# Patient Record
Sex: Male | Born: 1940 | Race: White | Hispanic: No | Marital: Married | Smoking: Former smoker
Health system: Southern US, Community
[De-identification: ages and names within clinical notes are randomized; demographics above are authoritative.]

## PROBLEM LIST (undated history)

## (undated) DIAGNOSIS — Z952 Presence of prosthetic heart valve: Secondary | ICD-10-CM

## (undated) DIAGNOSIS — I10 Essential (primary) hypertension: Secondary | ICD-10-CM

## (undated) DIAGNOSIS — I350 Nonrheumatic aortic (valve) stenosis: Secondary | ICD-10-CM

## (undated) DIAGNOSIS — I2510 Atherosclerotic heart disease of native coronary artery without angina pectoris: Secondary | ICD-10-CM

## (undated) DIAGNOSIS — E782 Mixed hyperlipidemia: Secondary | ICD-10-CM

## (undated) DIAGNOSIS — I739 Peripheral vascular disease, unspecified: Secondary | ICD-10-CM

## (undated) DIAGNOSIS — I779 Disorder of arteries and arterioles, unspecified: Secondary | ICD-10-CM

## (undated) DIAGNOSIS — K8689 Other specified diseases of pancreas: Secondary | ICD-10-CM

## (undated) DIAGNOSIS — I447 Left bundle-branch block, unspecified: Secondary | ICD-10-CM

## (undated) DIAGNOSIS — Z951 Presence of aortocoronary bypass graft: Secondary | ICD-10-CM

## (undated) DIAGNOSIS — M1990 Unspecified osteoarthritis, unspecified site: Secondary | ICD-10-CM

## (undated) DIAGNOSIS — I4891 Unspecified atrial fibrillation: Secondary | ICD-10-CM

## (undated) HISTORY — DX: Left bundle-branch block, unspecified: I44.7

## (undated) HISTORY — PX: OTHER SURGICAL HISTORY: SHX169

## (undated) HISTORY — DX: Morbid (severe) obesity due to excess calories: E66.01

## (undated) HISTORY — DX: Nonrheumatic aortic (valve) stenosis: I35.0

## (undated) HISTORY — DX: Other specified diseases of pancreas: K86.89

## (undated) HISTORY — DX: Peripheral vascular disease, unspecified: I73.9

## (undated) HISTORY — DX: Atherosclerotic heart disease of native coronary artery without angina pectoris: I25.10

## (undated) HISTORY — DX: Unspecified atrial fibrillation: I48.91

## (undated) HISTORY — DX: Presence of aortocoronary bypass graft: Z95.1

## (undated) HISTORY — DX: Disorder of arteries and arterioles, unspecified: I77.9

## (undated) HISTORY — DX: Mixed hyperlipidemia: E78.2

## (undated) HISTORY — DX: Essential (primary) hypertension: I10

---

## 1986-11-14 HISTORY — PX: VARICOSE VEIN SURGERY: SHX832

## 2002-01-29 ENCOUNTER — Encounter: Payer: Self-pay | Admitting: Cardiology

## 2002-01-29 ENCOUNTER — Inpatient Hospital Stay (HOSPITAL_COMMUNITY): Admission: EM | Admit: 2002-01-29 | Discharge: 2002-01-31 | Payer: Self-pay | Admitting: Cardiology

## 2002-04-18 ENCOUNTER — Inpatient Hospital Stay (HOSPITAL_COMMUNITY): Admission: EM | Admit: 2002-04-18 | Discharge: 2002-04-24 | Payer: Self-pay | Admitting: Cardiology

## 2002-04-21 ENCOUNTER — Encounter: Payer: Self-pay | Admitting: Cardiology

## 2003-02-14 ENCOUNTER — Inpatient Hospital Stay (HOSPITAL_COMMUNITY): Admission: AD | Admit: 2003-02-14 | Discharge: 2003-02-24 | Payer: Self-pay | Admitting: Cardiology

## 2003-02-15 ENCOUNTER — Encounter: Payer: Self-pay | Admitting: Cardiology

## 2003-02-16 ENCOUNTER — Encounter: Payer: Self-pay | Admitting: Cardiology

## 2003-02-17 ENCOUNTER — Encounter: Payer: Self-pay | Admitting: Cardiothoracic Surgery

## 2003-02-17 DIAGNOSIS — Z951 Presence of aortocoronary bypass graft: Secondary | ICD-10-CM

## 2003-02-17 HISTORY — DX: Presence of aortocoronary bypass graft: Z95.1

## 2003-02-17 HISTORY — PX: CORONARY ARTERY BYPASS GRAFT: SHX141

## 2003-02-18 ENCOUNTER — Encounter: Payer: Self-pay | Admitting: Cardiothoracic Surgery

## 2003-02-19 ENCOUNTER — Encounter: Payer: Self-pay | Admitting: Cardiothoracic Surgery

## 2003-02-20 ENCOUNTER — Encounter: Payer: Self-pay | Admitting: Cardiothoracic Surgery

## 2003-03-20 ENCOUNTER — Encounter: Admission: RE | Admit: 2003-03-20 | Discharge: 2003-03-20 | Payer: Self-pay | Admitting: Cardiothoracic Surgery

## 2003-03-20 ENCOUNTER — Encounter: Payer: Self-pay | Admitting: Cardiothoracic Surgery

## 2004-11-19 ENCOUNTER — Ambulatory Visit: Payer: Self-pay | Admitting: Cardiology

## 2005-05-09 ENCOUNTER — Ambulatory Visit: Payer: Self-pay | Admitting: Cardiology

## 2006-04-17 ENCOUNTER — Ambulatory Visit: Payer: Self-pay | Admitting: Cardiology

## 2006-10-18 ENCOUNTER — Ambulatory Visit: Payer: Self-pay | Admitting: Cardiology

## 2006-10-27 ENCOUNTER — Ambulatory Visit: Payer: Self-pay | Admitting: Cardiology

## 2006-11-09 ENCOUNTER — Ambulatory Visit: Payer: Self-pay | Admitting: Cardiology

## 2007-05-21 ENCOUNTER — Ambulatory Visit: Payer: Self-pay | Admitting: Cardiology

## 2008-01-22 ENCOUNTER — Ambulatory Visit: Payer: Self-pay | Admitting: Cardiology

## 2008-02-04 ENCOUNTER — Encounter: Payer: Self-pay | Admitting: Cardiology

## 2008-02-04 ENCOUNTER — Ambulatory Visit: Payer: Self-pay | Admitting: Cardiology

## 2008-02-05 ENCOUNTER — Inpatient Hospital Stay (HOSPITAL_COMMUNITY): Admission: AD | Admit: 2008-02-05 | Discharge: 2008-02-06 | Payer: Self-pay | Admitting: Internal Medicine

## 2008-02-05 ENCOUNTER — Encounter: Payer: Self-pay | Admitting: Cardiology

## 2008-02-05 ENCOUNTER — Ambulatory Visit: Payer: Self-pay | Admitting: Internal Medicine

## 2008-02-14 ENCOUNTER — Ambulatory Visit: Payer: Self-pay | Admitting: Cardiology

## 2008-02-14 ENCOUNTER — Encounter: Payer: Self-pay | Admitting: Physician Assistant

## 2008-02-28 ENCOUNTER — Encounter: Payer: Self-pay | Admitting: Cardiology

## 2008-02-28 ENCOUNTER — Ambulatory Visit: Payer: Self-pay | Admitting: Cardiology

## 2008-09-15 ENCOUNTER — Ambulatory Visit: Payer: Self-pay | Admitting: Cardiology

## 2009-04-10 ENCOUNTER — Encounter: Payer: Self-pay | Admitting: Physician Assistant

## 2009-04-10 ENCOUNTER — Ambulatory Visit: Payer: Self-pay | Admitting: Cardiology

## 2009-08-06 DIAGNOSIS — I2510 Atherosclerotic heart disease of native coronary artery without angina pectoris: Secondary | ICD-10-CM | POA: Insufficient documentation

## 2009-08-06 DIAGNOSIS — E785 Hyperlipidemia, unspecified: Secondary | ICD-10-CM | POA: Insufficient documentation

## 2009-08-06 DIAGNOSIS — I4811 Longstanding persistent atrial fibrillation: Secondary | ICD-10-CM | POA: Insufficient documentation

## 2009-09-10 ENCOUNTER — Encounter: Payer: Self-pay | Admitting: Cardiology

## 2009-10-19 ENCOUNTER — Ambulatory Visit: Payer: Self-pay | Admitting: Cardiology

## 2009-10-19 DIAGNOSIS — I739 Peripheral vascular disease, unspecified: Secondary | ICD-10-CM | POA: Insufficient documentation

## 2009-10-19 DIAGNOSIS — I6529 Occlusion and stenosis of unspecified carotid artery: Secondary | ICD-10-CM

## 2009-10-19 DIAGNOSIS — I10 Essential (primary) hypertension: Secondary | ICD-10-CM | POA: Insufficient documentation

## 2009-10-21 ENCOUNTER — Encounter: Payer: Self-pay | Admitting: Cardiology

## 2010-03-17 ENCOUNTER — Telehealth (INDEPENDENT_AMBULATORY_CARE_PROVIDER_SITE_OTHER): Payer: Self-pay | Admitting: *Deleted

## 2010-04-16 ENCOUNTER — Ambulatory Visit: Payer: Self-pay | Admitting: Cardiology

## 2010-06-17 ENCOUNTER — Encounter (INDEPENDENT_AMBULATORY_CARE_PROVIDER_SITE_OTHER): Payer: Self-pay | Admitting: *Deleted

## 2010-06-18 ENCOUNTER — Encounter: Payer: Self-pay | Admitting: Cardiology

## 2010-09-21 ENCOUNTER — Telehealth (INDEPENDENT_AMBULATORY_CARE_PROVIDER_SITE_OTHER): Payer: Self-pay | Admitting: *Deleted

## 2010-10-09 ENCOUNTER — Encounter: Payer: Self-pay | Admitting: Cardiology

## 2010-10-20 ENCOUNTER — Encounter: Payer: Self-pay | Admitting: Cardiology

## 2010-10-25 ENCOUNTER — Encounter: Payer: Self-pay | Admitting: Cardiology

## 2010-10-25 ENCOUNTER — Ambulatory Visit: Payer: Self-pay | Admitting: Cardiology

## 2010-10-25 ENCOUNTER — Encounter (INDEPENDENT_AMBULATORY_CARE_PROVIDER_SITE_OTHER): Payer: Self-pay | Admitting: *Deleted

## 2010-10-25 DIAGNOSIS — I429 Cardiomyopathy, unspecified: Secondary | ICD-10-CM | POA: Insufficient documentation

## 2010-11-22 ENCOUNTER — Encounter: Payer: Self-pay | Admitting: Cardiology

## 2010-11-22 ENCOUNTER — Ambulatory Visit
Admission: RE | Admit: 2010-11-22 | Discharge: 2010-11-22 | Payer: Self-pay | Source: Home / Self Care | Attending: Cardiology | Admitting: Cardiology

## 2010-11-22 DIAGNOSIS — R0609 Other forms of dyspnea: Secondary | ICD-10-CM | POA: Insufficient documentation

## 2010-11-22 DIAGNOSIS — R0989 Other specified symptoms and signs involving the circulatory and respiratory systems: Secondary | ICD-10-CM

## 2010-12-14 NOTE — Progress Notes (Signed)
Summary: Plavix assistance  Phone Note Call from Patient Call back at Home Phone [phone]   Summary of Call: Pt left message at 1643 stating he needs info on getting help with plavix Initial call taken by: Cyril Loosen, RN, BSN,  Mar 17, 2010 5:06 PM  Follow-up for Phone Call        Pt notified to contact [phone] or his local health dept for assistance with Plavix and Lipitor. Pt will notify our office if he has problems with this. Follow-up by: Cyril Loosen, RN, BSN,  Mar 18, 2010 9:14 AM

## 2010-12-14 NOTE — Letter (Signed)
Summary: External Correspondence/ [facility]  External Correspondence/ [facility]   Imported By: Dorise Hiss 10/19/2010 08:59:18  _____________________________________________________________________  External Attachment:    Type:   Image     Comment:   External Document

## 2010-12-14 NOTE — Progress Notes (Signed)
Summary: Pending labs  ---- Converted from flag ---- ---- 09/17/2010 10:41 AM, Claudette Laws wrote: patient will have his labs that are due in december at his PCP. ------------------------------  Appended Document: Orders Update Pt will go to the [facility] for labs before 12/12 ov-   Clinical Lists Changes  Orders: Added new Test order of T-Lipid Profile [phone]) - Signed Added new Test order of T-Hepatic Function [phone]) - Signed

## 2010-12-14 NOTE — Miscellaneous (Signed)
Summary: Orders Update  Clinical Lists Changes  Orders: Added new Test order of T-Lipid Profile ([phone]) - Signed Added new Test order of T-Hepatic Function ([id number]) - Signed 

## 2010-12-14 NOTE — Assessment & Plan Note (Signed)
Summary: 6 mo fu per june reminder-srs   Visit Type:  Follow-up Primary Provider:  Dr. Barron Alvine   History of Present Illness: 70 year old male presents for follow-up. Generally he reports feeling well. He does have some limitation related to toe pain on his right foot, and apparently is pending a podiatry procedure to help with this.  I reviewed with him his vascular studies from December revealing no obstructive carotid disease or evidence of obstructive peripheral arterial disease at rest.  He denies any significant progressive anginal symptoms, and has NYHA class II dyspnea exertion. He reports compliance with medications.  He did ask about the possibility of switching to a generic statin. He has tolerated Lipitor long-term favorable lipid numbers over the years.  Today we talked about weight loss, and trying to get back to her regular walking regimen once he gets his foot pain improved.  Preventive Screening-Counseling & Management  Alcohol-Tobacco     Smoking Status: quit     Packs/Day: cigar     Year Quit: 20 yr ago   Current Medications (verified): 1)  Toprol Xl 25 Mg Xr24h-Tab (Metoprolol Succinate) .... Take 1 Tablet By Mouth Once A Day 2)  Norvasc 10 Mg Tabs (Amlodipine Besylate) .... Take 1 Tablet By Mouth Once A Day 3)  Aspirin 81 Mg Tbec (Aspirin) .... Take 1 Tablet By Mouth Once A Day 4)  Lisinopril 20 Mg Tabs (Lisinopril) .... Take 1 Tablet By Mouth Once A Day 5)  Allopurinol 100 Mg Tabs (Allopurinol) .... Take 1 Tablet By Mouth Once A Day 6)  Plavix 75 Mg Tabs (Clopidogrel Bisulfate) .... Take 1 Tablet By Mouth Once A Day 7)  Lipitor 10 Mg Tabs (Atorvastatin Calcium) .... 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  Alcohol Use - no  Social History: Packs/Day:  cigar  Review of Systems  The patient denies anorexia, fever, weight loss, chest pain, syncope, dyspnea on exertion, peripheral edema, prolonged cough, melena, hematochezia, and severe indigestion/heartburn.         Otherwise reviewed and negative.  Vital Signs:  Patient profile:   70 year old male Height:      77 inches Weight:      320.50 pounds Pulse rate:   54 / minute BP sitting:   135 / 58  (left arm) Cuff size:   large  Vitals Entered By: Hoover Brunette, LPN (April 16, 2840 1:13 PM) Is Patient Diabetic? No Comments Pt was given application for Plavix assistence today, as well as, samples for Plavix. Lot # [id number], EXP 12/2010 #16 Cyril Loosen, RN, BSN  April 16, 2010 1:44 PM    Physical Exam  Additional Exam:  Morbidly obese male, in no acute distress. HEENT: Conjunctiva and lids normal, oropharynx with moist mucosa. Neck: Supple, left carotid bruit, no thyromegaly. Cardiac: Regular rate and rhythm, no S3 gallop or pathologic systolic murmur. Lungs: Clear to auscultation. Abdomen: Soft, nontender, no bruits, bowel sounds present. Extremities: No pitting edema, distal pulses 1+. Skin: Warm and dry. Musculoskeletal: No gross deformities.  Neuropsychiatric: Alert and oriented x3, affect appropriate.   Carotid Doppler  Procedure date:  10/22/2009  Findings:      No significant RICA stenosis.  Less than 50% LICA stenosis.  ABI's  Procedure date:  10/22/2009  Findings:      No evidence of hemodynamically significant lower extremity arterial occlusive disease at rest.  Nuclear Study  Procedure date:  01/29/2008  Findings:      Partially reversible mid to basal  inferior defect consistent with element of scar and ischemia, LVEF 49%.  EKG  Procedure date:  04/16/2010  Findings:      Sinus bradycardia at 56 beats per minute with PVCs and prolonged PR interval of 224 ms.  Impression & Recommendations:  Problem # 1:  CAD, NATIVE VESSEL (ICD-414.01)  Symptomatically stable without progressive angina on medical therapy. No specific adjustments were made today. I will see him back in 6 months.  His updated medication list for this problem includes:    Toprol Xl 25 Mg Xr24h-tab (Metoprolol succinate) .Marland Kitchen... Take 1 tablet by mouth once a day    Norvasc 10 Mg Tabs (Amlodipine besylate) .Marland Kitchen... Take 1 tablet by mouth once a day    Aspirin 81 Mg Tbec (Aspirin) .Marland Kitchen... Take 1 tablet by mouth once a day    Lisinopril 20 Mg Tabs (Lisinopril) .Marland Kitchen... Take 1 tablet by mouth once a day    Plavix 75 Mg Tabs (Clopidogrel bisulfate) .Marland Kitchen... Take 1 tablet by mouth once a day  Problem # 2:  HYPERLIPIDEMIA-MIXED (ICD-272.4)  We discussed changing to a generic statin today. He will complete his present Lipitor bottle, and we will switch to simvastatin 20 g p.o. q.h.s. If he tolerates this, plan followup lipid profile and liver function tests over the next 12 weeks.  The following medications were removed from the medication list:    Lipitor 10 Mg Tabs (Atorvastatin calcium) .Marland Kitchen... Take 1 tablet by mouth once a day His updated medication list for this problem includes:    Simvastatin 20 Mg Tabs (Simvastatin) .Marland Kitchen... Take one tablet by mouth daily at bedtime  Problem # 3:  ESSENTIAL HYPERTENSION, BENIGN (ICD-401.1)  Blood pressure mildly increased today. We discussed this. Recommended sodium restriction, regular exercise, and continued medical therapy.  His updated medication list for this problem includes:    Toprol Xl 25 Mg Xr24h-tab (Metoprolol succinate) .Marland Kitchen... Take 1 tablet by mouth once a day    Norvasc 10 Mg Tabs (Amlodipine besylate) .Marland Kitchen... Take 1 tablet by mouth once a  day    Aspirin 81 Mg Tbec (Aspirin) .Marland Kitchen... Take 1 tablet by mouth once a day    Lisinopril 20 Mg Tabs (Lisinopril) .Marland Kitchen... Take 1 tablet by mouth once a day  Problem # 4:  CAROTID ARTERY DISEASE (ICD-433.10)  Nonobstructive by followup duplex exam in December.  His updated medication list for this problem includes:    Aspirin 81 Mg Tbec (Aspirin) .Marland Kitchen... Take 1 tablet by mouth once a day    Plavix 75 Mg Tabs (Clopidogrel bisulfate) .Marland Kitchen... 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 It could be that some of his leg pain is related to lower back disc disease or neuropathic pain. He will follow up with his primary care physician for this.  Other Orders: EKG w/ Interpretation (93000)  Patient Instructions: 1)  Finish current supply of Lipitor and  then start simvastatin 20mg  by mouth at bedtime. A prescription was placed on file for you at your pharmacy today.  2)  Your physician recommends that you go to the [facility] for a FASTING lipid profile and liver function labs:  IN 12 WEEKS. 3)  Your physician wants you to follow-up in: 6 months. You will receive a reminder letter in the mail one-two months in advance. If you don't receive a letter, please call our office to schedule the follow-up appointment. Prescriptions: SIMVASTATIN 20 MG TABS (SIMVASTATIN) Take one tablet by mouth daily at bedtime  #30 x 6   Entered by:   Cyril Loosen, RN, BSN   Authorized by:   Loreli Slot, MD, [facility]   Signed by:   Loreli Slot, MD, [facility] on 04/16/2010   Method used:   Electronically to        [facility]* (retail)       [address]       [place], [place]  [address]       Ph: [phone]       Fax: [phone]   RxID:   [id number]

## 2010-12-16 NOTE — Assessment & Plan Note (Signed)
Summary: 1 MO F/U PER 12/12 OV-JM   Visit Type:  Follow-up Primary Provider:  Dr. Barron Alvine   History of Present Illness: 70 year old male presents for followup. He was seen recently in December. When he was seen at that time, he was noted to be in atrial fibrillation which had developed following a hospitalization in November in association with a fall without syncope, and C6 right laminar fracture with left clavicular fracture. We elected to leave him on aspirin and Plavix at that time with relatively low CHADS2 score in anticipation of possible consideration for DCCV on Coumadin if his atrial fibrillation persisted.  Followup labs in December showed AST 20, ALT 22, cholesterol 121, HDL 31, LDL 58, triglycerides 161.  He is here for followup with his wife. Does report dyspnea on exertion, NYHA class 2-3 at times. No angina. No sense of palpitations, no dizziness or syncope. Does have intermittent lower extremity edema as described previously, better with Lasix.  He is still wearing a hard collar as his neck heels. He reports followup pending in February, at which time he is hopeful that the brace will be able to be taken off. It is that at this point that we are considering placing him on Coumadin and considering an elective cardioversion if atrial fibrillation persists.  I suspect that some of the shortness of breath is related to the atrial fibrillation, although we also plan to followup on his LVEF ultimately.  Preventive Screening-Counseling & Management  Alcohol-Tobacco     Smoking Status: quit     Year Quit: 1991  Current Medications (verified): 1)  Toprol Xl 50 Mg Xr24h-Tab (Metoprolol Succinate) .... Take 1 Tablet By Mouth Once A Day 2)  Norvasc 10 Mg Tabs (Amlodipine Besylate) .... Take 1 Tablet By Mouth Once A Day 3)  Aspirin 81 Mg Tbec (Aspirin) .... Take 1 Tablet By Mouth Once A Day 4)  Lisinopril 20 Mg Tabs (Lisinopril) .... Take 1 Tablet By Mouth Once A Day 5)  Allopurinol  100 Mg Tabs (Allopurinol) .... Take 1 Tablet By Mouth Once A Day 6)  Plavix 75 Mg Tabs (Clopidogrel Bisulfate) .... Take 1 Tablet By Mouth Once A Day 7)  Simvastatin 20 Mg Tabs (Simvastatin) .... Take 1/2 Tablet By Mouth Daily At Bedtime 8)  Lortab 5-500 Mg Tabs (Hydrocodone-Acetaminophen) .... Take 1 Tablet By Mouth Four Times A Day As Needed 9)  Roxicodone 5 Mg Tabs (Oxycodone Hcl) .... Take One By Mouth Every 4 Hours As Needed Pain 10)  Klor-Con 10 10 Meq Cr-Tabs (Potassium Chloride) .... Take 1 Tablet By Mouth Once A Day 11)  Furosemide 20 Mg Tabs (Furosemide) .... Take One Tablet By Mouth Daily.  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  Alcohol Use - no  Review of Systems       The patient complains of dyspnea on exertion, peripheral edema, and headaches.  The patient denies anorexia, fever, weight gain, chest pain, syncope, prolonged cough, melena, and hematochezia.         Otherwise reviewed and negative except as outlined.  Vital Signs:  Patient profile:  70 year old male Height:      77 inches Weight:      313 pounds Pulse rate:   76 / minute BP sitting:   124 / 76  (left arm) Cuff size:   large  Vitals Entered By: Carlye Grippe (November 22, 2010 8:39 AM)  Physical Exam  Additional Exam:  Morbidly obese male, in no acute distress. HEENT: Conjunctiva and lids normal, oropharynx with moist mucosa. Neck: Neck brace in place. Cardiac: Irregularly irregular, no S3 gallop or pathologic systolic murmur.  PMI indistinct. Lungs: Clear to auscultation. Abdomen: Soft, nontender, no bruits, bowel sounds present. Extremities: 1+ pitting edema, distal pulses 1+. Skin: Warm and dry. Musculoskeletal: No gross deformities. Neuropsychiatric: Alert and oriented x3, affect appropriate.   EKG  Procedure date:  11/22/2010  Findings:      Atrial fibrillation at 83 beats per minute, nonspecific T wave changes.  Impression & Recommendations:  Problem # 1:  ATRIAL FIBRILLATION (ICD-427.31)  Persistent, rate controlled at rest. Plan to continue aspirin and Plavix at this point. Once he is able to come out of his hard cervical collar, we will consider Coumadin temporarily with plan for elective cardioversion. I will see him back following his orthopedic followup in February.  His updated medication list for this problem includes:    Toprol Xl 50 Mg Xr24h-tab (Metoprolol succinate) .Marland Kitchen... Take 1 tablet by mouth once a day    Aspirin 81 Mg Tbec (Aspirin) .Marland Kitchen... Take 1 tablet by mouth once a day    Plavix 75 Mg Tabs (Clopidogrel bisulfate) .Marland Kitchen... Take 1 tablet by mouth once a day  Orders: EKG w/ Interpretation (93000)  Problem # 2:  CAD, NATIVE VESSEL (ICD-414.01)  No active angina. Continue medical therapy.  His updated medication list for this problem includes:    Toprol Xl 50 Mg Xr24h-tab (Metoprolol succinate) .Marland Kitchen... Take 1 tablet by mouth once a day    Norvasc 10 Mg Tabs (Amlodipine besylate) .Marland Kitchen... Take 1 tablet by mouth once a day    Aspirin 81 Mg Tbec (Aspirin) .Marland Kitchen... Take 1 tablet by mouth once a day    Lisinopril 20 Mg Tabs (Lisinopril) .Marland Kitchen... Take 1 tablet by mouth once a day    Plavix 75 Mg Tabs (Clopidogrel bisulfate) .Marland Kitchen... 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 Will need to reassess this, likely following  conversion to sinus rhythm.  His updated medication list for this problem includes:    Toprol Xl 50 Mg Xr24h-tab (Metoprolol succinate) .Marland Kitchen... Take 1 tablet by mouth once a day    Norvasc 10 Mg Tabs (Amlodipine besylate) .Marland Kitchen... Take 1 tablet by mouth once a day    Aspirin 81 Mg Tbec (Aspirin) .Marland Kitchen... Take 1 tablet by mouth once a day    Lisinopril 20 Mg Tabs (Lisinopril) .Marland Kitchen... Take 1 tablet by mouth once a day    Plavix 75 Mg Tabs (Clopidogrel bisulfate) .Marland Kitchen... Take 1 tablet by mouth once a day    Furosemide 20 Mg Tabs (Furosemide) .Marland Kitchen... Take one tablet by mouth daily.  Problem # 4:  DYSPNEA ON EXERTION (ICD-786.09)  Likely related to increased heart rate with activity while in atrial fibrillation. Toprol-XL will be advanced to 50 mg daily. May also be a component of left ventricular dysfunction. No obvious angina at this point.  His updated medication  list for this problem includes:    Toprol Xl 50 Mg Xr24h-tab (Metoprolol succinate) .Marland Kitchen... Take 1 tablet by mouth once a day    Norvasc 10 Mg Tabs (Amlodipine besylate) .Marland Kitchen... Take 1 tablet by mouth once a day    Aspirin 81 Mg Tbec (Aspirin) .Marland Kitchen... Take 1 tablet by mouth once a day    Lisinopril 20 Mg Tabs (Lisinopril) .Marland Kitchen... Take 1 tablet by mouth once a day    Furosemide 20 Mg Tabs (Furosemide) .Marland Kitchen... Take one tablet by mouth daily.  Patient Instructions: 1)  Increase Toprol XL to 50mg  daily 2)  Follow up in  6 weeks. Prescriptions: TOPROL XL 50 MG XR24H-TAB (METOPROLOL SUCCINATE) Take 1 tablet by mouth once a day  #30 x 6   Entered by:   Hoover Brunette, LPN   Authorized by:   Loreli Slot, MD, [facility]   Signed by:   Hoover Brunette, LPN on 95/28/4132   Method used:   Electronically to        [facility]* (retail)       [address]       [place], [place]  [address]       Ph: [phone]       Fax: [phone]   RxID:   [id number] PLAVIX 75 MG TABS (CLOPIDOGREL BISULFATE) Take 1 tablet by mouth once a day  #4 x 0    Entered by:   Carlye Grippe   Authorized by:   Loreli Slot, MD, [facility]   Signed by:   Carlye Grippe on 11/22/2010   Method used:   Samples Given   RxID:   [id number]

## 2010-12-16 NOTE — Assessment & Plan Note (Signed)
Summary: 6 mo fu per dec reminder   Visit Type:  Follow-up Primary Provider:  Dr. Barron Alvine   History of Present Illness: 70 year old male presents for followup. He was seen back in June for routine followup. In the interim he was admitted to [facility] in November following a fall resulting in a C6 right laminar fracture and left clavicular fracture. At that time he was seen by cardiology for atrial fibrillation. Echocardiography reportedly revealed an LVEF of 40% with mild global hypokinesis and mild aortic stenosis. He was not initiated on Coumadin with described CHADS2 score of one.  He denies any syncope, stating that his fall was secondary to "tripping on some steps."  He has had atrial fibrillation documented in the past in the postoperative setting, underwent successful  DCCV. He was on Coumadin temporarily.  Recent labs from 7 December showed AST 20, ALT 22, cholesterol 121, triglycerides 161, HDL 31, LDL 58. He reports compliance with his medications.  He has not been as active, does report some increasing lower cavity edema. Still has residual pain, uses oxycodone sparingly. Is not reporting any angina or palpitations.  We discussed the possibility of a DCCV if atrial fibrillation persists, and that it would require at least a temporary course of Coumadin. In that event, I would want to take him off Plavix temporarily. He does have a relatively low CHADS2 score, and we elected to wait until he has recovered from his injuries, reassessing him sometime in January.  Clinical Review Panels:  Cardiac Imaging Cardiac Cath Findings  ASSESSMENT:   1. Coronary artery disease as described above with chronic total       occlusion of the left anterior descending.   2. Left internal mammary artery to the left anterior descending is       widely patent.   3. Saphenous vein graft to the diagonal was widely patent.   4. Left subclavian is free of any proximal stenoses.   5. Normal left ventricular function.      PLAN/DISCUSSION:  Despite his stress test, cannot find any obvious   culprit lesion to explain his symptoms.  We will check a D-dimer to rule   out possible pulmonary embolus.  I suspect this will be negative.      Will plan possible discharge in the a.m.  Can consider cardiac rehab if   he continues to have symptoms.      Bevelyn Buckles. Bensimhon, MD  (02/05/2008)    Preventive Screening-Counseling & Management  Alcohol-Tobacco     Smoking Status: quit     Year Quit: 1991  Current Medications (verified): 1)  Toprol Xl 25 Mg Xr24h-Tab (Metoprolol Succinate) .... Take 1 Tablet By Mouth Once A Day 2)  Norvasc 10 Mg Tabs (Amlodipine Besylate) .... Take 1 Tablet By Mouth Once A Day 3)  Aspirin 81 Mg Tbec (Aspirin) .... Take 1 Tablet By Mouth Once A Day 4)  Lisinopril 20 Mg Tabs (Lisinopril) .... Take 1 Tablet By Mouth Once A Day 5)  Allopurinol 100 Mg Tabs (Allopurinol) .... Take 1 Tablet By Mouth Once A Day 6)  Plavix 75 Mg Tabs (Clopidogrel Bisulfate) .... Take 1 Tablet By Mouth Once A Day 7)  Simvastatin 20 Mg Tabs (Simvastatin) .... Take 1/2 Tablet By Mouth Daily At Bedtime 8)  Lortab 5-500 Mg Tabs (Hydrocodone-Acetaminophen) .... Take 1 Tablet By Mouth Four Times A Day As Needed 9)  Roxicodone 5 Mg Tabs (Oxycodone Hcl) .... Take One By Mouth Every 4  Hours As Needed Pain 10)  Klor-Con 10 10 Meq Cr-Tabs (Potassium Chloride) .... Take 1 Tablet By Mouth Once A Day 11)  Furosemide 20 Mg Tabs (Furosemide) .... Take One Tablet By Mouth Daily.  Allergies (verified): No Known Drug Allergies  Comments:  Nurse/Medical Assistant: The patient's medication list and allergies were reviewed with the patient and were updated in the Medication and Allergy Lists.  Past History:  Past Medical History: Last updated: 10/19/2009 Atrial Fibrillation CAD - multivessel s/p CABG, LVEF 60%, residual occluded small nondominant RCA (not  bypassed) Hyperlipidemia Hypertension Morbid obesity Nonobstructive carotid artery disease  Past Surgical History: Last updated: 10/19/2009 CABG - April 2004, off pump, LIMA to LAD, SVG to diagonal  Social History: Last updated: 08/06/2009 Retired  Disabled  Married  Tobacco Use - Former.  Alcohol Use - no  Review of Systems       The patient complains of peripheral edema.  The patient denies anorexia, fever, weight loss, chest pain, syncope, dyspnea on exertion, hemoptysis, abdominal pain, melena, and hematochezia.         Otherwise reviewed and negative except as outlined.  Vital Signs:  Patient profile:   70 year old male Height:      77 inches Weight:      314 pounds BMI:     37.37 Pulse rate:   81 / minute BP sitting:   111 / 68  (left arm) Cuff size:   large  Vitals Entered By: Carlye Grippe (October 25, 2010 8:40 AM)  Nutrition Counseling: Patient's BMI is greater than 25 and therefore counseled on weight management options.  Physical Exam  Additional Exam:  Morbidly obese male, in no acute distress. HEENT: Conjunctiva and lids normal, oropharynx with moist mucosa. Neck: Neck brace in place. Cardiac: Irregularly irregular, no S3 gallop or pathologic systolic murmur. PMI indistinct. Lungs: Clear to auscultation. Abdomen: Soft, nontender, no bruits, bowel sounds present. Extremities: 1+ pitting edema, distal pulses 1+. Skin: Warm and dry. Musculoskeletal: No gross deformities. Neuropsychiatric: Alert and oriented x3, affect appropriate.   EKG  Procedure date:  10/25/2010  Findings:      Atrial fibrillation at 76 beats per minute with single PVC, nonspecific ST-T changes.  Impression & Recommendations:  Problem # 1:  ATRIAL FIBRILLATION (ICD-427.31)  Recurrent, diagnosed following a fall back in November as outlined above. Rate is adequately controlled on present regimen. CHADS2 score is approximately 1-2. At this point we plan to continue  observation on aspirin and Plavix, allow further recovery from his injuries, and have him seen in January to consider the possibility of initiating Coumadin with discontinuation of Plavix temporarily, and DCCV if atrial fibrillation persists.  His updated medication list for this problem includes:    Toprol Xl 25 Mg Xr24h-tab (Metoprolol succinate) .Marland Kitchen... Take 1 tablet by mouth once a day    Aspirin 81 Mg Tbec (Aspirin) .Marland Kitchen... Take 1 tablet by mouth once a day    Plavix 75 Mg Tabs (Clopidogrel bisulfate) .Marland Kitchen... Take 1 tablet by mouth once a day  Orders: EKG w/ Interpretation (93000)  Problem # 2:  ESSENTIAL HYPERTENSION, BENIGN (ICD-401.1)  Blood pressure well-controlled today.  His updated medication list for this problem includes:    Toprol Xl 25 Mg Xr24h-tab (Metoprolol succinate) .Marland Kitchen... Take 1 tablet by mouth once a day    Norvasc 10 Mg Tabs (Amlodipine besylate) .Marland Kitchen... Take 1 tablet by mouth once a day    Aspirin 81 Mg Tbec (Aspirin) .Marland Kitchen... Take 1 tablet by  mouth once a day    Lisinopril 20 Mg Tabs (Lisinopril) .Marland Kitchen... Take 1 tablet by mouth once a day    Furosemide 20 Mg Tabs (Furosemide) .Marland Kitchen... Take one tablet by mouth daily.  Problem # 3:  HYPERLIPIDEMIA-MIXED (ICD-272.4)  Recent lipids reviewed. LDL well controlled.  His updated medication list for this problem includes:    Simvastatin 20 Mg Tabs (Simvastatin) .Marland Kitchen... Take 1/2 tablet by mouth daily at bedtime  His updated medication list for this problem includes:    Simvastatin 20 Mg Tabs (Simvastatin) .Marland Kitchen... Take 1/2 tablet by mouth daily at bedtime  Problem # 4:  CAD, NATIVE VESSEL (ICD-414.01)  No active angina.  His updated medication list for this problem includes:    Toprol Xl 25 Mg Xr24h-tab (Metoprolol succinate) .Marland Kitchen... Take 1 tablet by mouth once a day    Norvasc 10 Mg Tabs (Amlodipine besylate) .Marland Kitchen... Take 1 tablet by mouth once a day    Aspirin 81 Mg Tbec (Aspirin) .Marland Kitchen... Take 1 tablet by mouth once a day    Lisinopril 20  Mg Tabs (Lisinopril) .Marland Kitchen... Take 1 tablet by mouth once a day    Plavix 75 Mg Tabs (Clopidogrel bisulfate) .Marland Kitchen... Take 1 tablet by mouth once a day  Orders: EKG w/ Interpretation (93000)  His updated medication list for this problem includes:    Toprol Xl 25 Mg Xr24h-tab (Metoprolol succinate) .Marland Kitchen... Take 1 tablet by mouth once a day    Norvasc 10 Mg Tabs (Amlodipine besylate) .Marland Kitchen... Take 1 tablet by mouth once a day    Aspirin 81 Mg Tbec (Aspirin) .Marland Kitchen... Take 1 tablet by mouth once a day    Lisinopril 20 Mg Tabs (Lisinopril) .Marland Kitchen... Take 1 tablet by mouth once a day    Plavix 75 Mg Tabs (Clopidogrel bisulfate) .Marland Kitchen... 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 Previously noted to be in the low normal range. Plan to continue current medication course, ultimately reassess following conversion to sinus rhythm. He is having some lower extremity edema, although has been less active. Low-dose Lasix will be added.  His updated medication list for this problem includes:    Toprol Xl 25 Mg Xr24h-tab (Metoprolol succinate) .Marland Kitchen... Take 1 tablet by mouth once a day    Norvasc 10 Mg Tabs (Amlodipine besylate) .Marland Kitchen... Take 1 tablet by mouth once a day    Aspirin 81 Mg Tbec (Aspirin) .Marland Kitchen... Take 1 tablet by mouth once a day    Lisinopril 20 Mg Tabs (Lisinopril) .Marland Kitchen... Take 1 tablet by mouth once a day    Plavix 75 Mg Tabs (Clopidogrel bisulfate) .Marland Kitchen... Take 1 tablet by mouth once a day    Furosemide 20 Mg Tabs (Furosemide) .Marland Kitchen... Take one tablet by mouth daily.  Patient Instructions: 1)  Follow up with Dr. Diona Browner on Monday, November 22, 2010 at 8:40am. 2)  Start Lasix (furosemide) 20mg  by mouth once daily. Prescriptions: FUROSEMIDE 20 MG TABS (FUROSEMIDE) Take one tablet by mouth daily.  #30 x 6   Entered by:   Cyril Loosen, RN, BSN   Authorized by:   Loreli Slot, MD, [facility]   Signed by:   Cyril Loosen, RN, BSN on 10/25/2010   Method used:   Electronically to        [facility] (retail)       [address]  [place], [place]  [address]       Ph: [phone]       Fax: [phone]   RxID:   [id number]   Handout requested.

## 2010-12-16 NOTE — Letter (Signed)
Summary: Engineer, materials at [facility]  [address]   [place], [place] [address]   Phone: [phone]  Fax: [phone]        October 25, 2010 MRN: [id number]    Clifford King [address] [place], [place]  [address]     Dear [name],  Your test ordered by Selena Batten has been reviewed by your physician (or physician assistant) and was found to be normal or stable. Your physician (or physician assistant) felt no changes were needed at this time.  ____ Echocardiogram  ____ Cardiac Stress Test  _X___ Lab Work  ____ Peripheral vascular study of arms, legs or neck  ____ CT scan or X-ray  ____ Lung or Breathing test  ____ Other:   Thank you.   Cyril Loosen, RN, BSN    Duane Boston, M.D., F.A.C.C. Thressa Sheller, M.D., F.A.C.C. Oneal Grout, M.D., F.A.C.C. Cheree Ditto, M.D., F.A.C.C. Daiva Nakayama, M.D., F.A.C.C. Kenney Houseman, M.D., F.A.C.C. Jeanne Ivan, PA-C

## 2011-01-04 ENCOUNTER — Ambulatory Visit: Payer: Self-pay | Admitting: Cardiology

## 2011-02-15 ENCOUNTER — Encounter: Payer: Self-pay | Admitting: Cardiology

## 2011-02-16 ENCOUNTER — Encounter: Payer: Self-pay | Admitting: *Deleted

## 2011-02-16 ENCOUNTER — Encounter: Payer: Self-pay | Admitting: Cardiology

## 2011-02-16 ENCOUNTER — Ambulatory Visit (INDEPENDENT_AMBULATORY_CARE_PROVIDER_SITE_OTHER): Payer: 59 | Admitting: Cardiology

## 2011-02-16 VITALS — BP 122/87 | HR 65 | Ht 77.0 in | Wt 310.0 lb

## 2011-02-16 DIAGNOSIS — I10 Essential (primary) hypertension: Secondary | ICD-10-CM

## 2011-02-16 DIAGNOSIS — I4891 Unspecified atrial fibrillation: Secondary | ICD-10-CM

## 2011-02-16 DIAGNOSIS — I2510 Atherosclerotic heart disease of native coronary artery without angina pectoris: Secondary | ICD-10-CM

## 2011-02-16 DIAGNOSIS — I429 Cardiomyopathy, unspecified: Secondary | ICD-10-CM

## 2011-02-16 DIAGNOSIS — Z7901 Long term (current) use of anticoagulants: Secondary | ICD-10-CM

## 2011-02-16 MED ORDER — WARFARIN SODIUM 5 MG PO TABS: ORAL_TABLET | ORAL | Status: DC

## 2011-02-16 NOTE — Patient Instructions (Addendum)
   Start Coumadin (warfarin) 5mg  daily until return visit with Misty Stanley in our coumadin clinic on 4/10. Your coumadin will be checked weekly pending cardioversion.  Your physician has recommended that you have a Cardioversion (DCCV). Electrical Cardioversion uses a jolt of electricity to your heart either through paddles or wired patches attached to your chest. This is a controlled, usually prescheduled, procedure. Defibrillation is done under light anesthesia in the hospital, and you usually go home the day of the procedure. This is done to get your heart back into a normal rhythm. You are not awake for the procedure. Please see the instruction sheet given to you today.  Stop Plavix.  Continue Aspirin.

## 2011-02-16 NOTE — Assessment & Plan Note (Signed)
Persistent, better rate control. Patient still reports symptoms, and our plan will be to initiate Coumadin, discontinue Plavix, and otherwise continue remaining medical therapy, with plan to enroll in the Coumadin clinic for close followup, and proceed with an elective cardioversion attempt after 4 weeks of therapeutic INR levels.

## 2011-02-16 NOTE — Assessment & Plan Note (Signed)
Will plan to reassess LVEF via echocardiography after cardioversion of atrial fibrillation.

## 2011-02-16 NOTE — Assessment & Plan Note (Signed)
No active angina, continue medical therapy. 

## 2011-02-16 NOTE — Assessment & Plan Note (Signed)
Blood pressure reasonably well controlled today. Continue present regimen.

## 2011-02-16 NOTE — Progress Notes (Signed)
Clinical Summary Clifford King is a 70 y.o.male presenting for routine followup. I saw him back in January of this year. He has been in atrial fibrillation which developed following a hospitalization in November in association with a fall without syncope, and C6 right laminar fracture with left clavicular fracture. We elected to leave him on aspirin and Plavix at that time with relatively low CHADS2 score in anticipation of possible consideration for DCCV on Coumadin if his atrial fibrillation persisted. He was still wearing a hard cervical collar as of his last visit.  He is here today with his wife for followup. Cervical collar has been off now for a few weeks, and the patient states that he is felt to be stable, not requiring any specific surgery.   Heart rate is better controlled, and blood pressure is stable. He reports no angina, however continues to have exertional fatigue and dyspnea, potentially related to his persistent atrial fibrillation. We discussed initiation of Coumadin, discontinuation of Plavix, and continuation of aspirin, with plan to enroll in our Coumadin clinic. After 4 weeks of therapeutic INR levels, a direct-current cardioversion will be scheduled in an attempt to restore sinus rhythm.   No Known Allergies  Current outpatient prescriptions:allopurinol (ZYLOPRIM) 100 MG tablet, Take 100 mg by mouth daily.  , Disp: , Rfl: ;  amLODipine (NORVASC) 10 MG tablet, Take 10 mg by mouth daily.  , Disp: , Rfl: ;  aspirin 81 MG tablet, Take 81 mg by mouth daily.  , Disp: , Rfl: ;  clopidogrel (PLAVIX) 75 MG tablet, Take 75 mg by mouth daily.  , Disp: , Rfl: ;  lisinopril (PRINIVIL,ZESTRIL) 20 MG tablet, Take 20 mg by mouth daily.  , Disp: , Rfl:  metoprolol (TOPROL-XL) 50 MG 24 hr tablet, Take 50 mg by mouth daily.  , Disp: , Rfl: ;  simvastatin (ZOCOR) 20 MG tablet, Take 10 mg by mouth at bedtime.  , Disp: , Rfl: ;  warfarin (COUMADIN) 5 MG tablet, Take daily by mouth as directed by the  [facility] , Disp: 60 tablet, Rfl: 2;  DISCONTD: furosemide (LASIX) 20 MG tablet, Take 20 mg by mouth daily.  , Disp: , Rfl:  DISCONTD: HYDROcodone-acetaminophen (VICODIN) 5-500 MG per tablet, Take 1 tablet by mouth every 6 (six) hours as needed.  , Disp: , Rfl: ;  DISCONTD: oxyCODONE (OXY IR/ROXICODONE) 5 MG immediate release tablet, Take 5 mg by mouth every 4 (four) hours as needed.  , Disp: , Rfl: ;  DISCONTD: potassium chloride (KLOR-CON) 10 MEQ CR tablet, Take 10 mEq by mouth daily.  , Disp: , Rfl:   Past Medical History  Diagnosis Date  . Coronary atherosclerosis of native coronary artery     Multivessel, LVEF 60%, occluded small nondominant RCA  (not bypassed)  . Atrial fibrillation   . Mixed hyperlipidemia   . Essential hypertension, benign   . Morbid obesity   . Carotid artery disease     Nonobstructive    Social History Clifford King reports that he quit smoking about 14 years ago. His smoking use included Cigarettes. He has never used smokeless tobacco. Clifford King reports that he does not drink alcohol.  Review of Systems No fevers, chills, or unusual weight change. No chest pain, cough, hemoptysis, or wheezing. No palpitations, dizziness, or syncope. No dysphasia or odynophagia. Stable appetite with no abdominal pain, melena, or hematochezia. No orthopnea, PND, or lower extremity edema. No focal motor weakness, memory problems, or speech deficits. Otherwise systems reviewed  and negative except as already outlined.   Physical Examination Filed Vitals:   02/16/11 1343  BP: 122/87  Pulse: 65  Morbidly obese male, in no acute distress. HEENT: Conjunctiva and lids normal, oropharynx with moist mucosa. Neck: Neck brace in place. Cardiac: Irregularly irregular, no S3 gallop or pathologic systolic murmur. PMI indistinct. Lungs: Clear to auscultation. Abdomen: Soft, nontender, no bruits, bowel sounds present. Extremities: 1+ pitting edema, distal pulses 1+. Skin:  Warm and dry. Musculoskeletal: No gross deformities. Neuropsychiatric: Alert and oriented x3, affect appropriate.   ECG Atrial fibrillation at 69 beats per minute.   Problem List and Plan

## 2011-02-21 ENCOUNTER — Encounter: Payer: Self-pay | Admitting: Cardiology

## 2011-02-21 DIAGNOSIS — Z7901 Long term (current) use of anticoagulants: Secondary | ICD-10-CM

## 2011-02-21 DIAGNOSIS — I4891 Unspecified atrial fibrillation: Secondary | ICD-10-CM

## 2011-02-22 ENCOUNTER — Ambulatory Visit (INDEPENDENT_AMBULATORY_CARE_PROVIDER_SITE_OTHER): Payer: 59 | Admitting: *Deleted

## 2011-02-22 DIAGNOSIS — I4891 Unspecified atrial fibrillation: Secondary | ICD-10-CM

## 2011-02-22 DIAGNOSIS — Z7901 Long term (current) use of anticoagulants: Secondary | ICD-10-CM

## 2011-02-22 LAB — POCT INR: INR: 1.1

## 2011-02-22 NOTE — Patient Instructions (Signed)
Coumadin teaching preformed with pt.  He verbalized understanding.

## 2011-03-01 ENCOUNTER — Ambulatory Visit (INDEPENDENT_AMBULATORY_CARE_PROVIDER_SITE_OTHER): Payer: 59 | Admitting: *Deleted

## 2011-03-01 DIAGNOSIS — I4891 Unspecified atrial fibrillation: Secondary | ICD-10-CM

## 2011-03-01 LAB — POCT INR: INR: 2

## 2011-03-08 ENCOUNTER — Ambulatory Visit (INDEPENDENT_AMBULATORY_CARE_PROVIDER_SITE_OTHER): Payer: 59 | Admitting: *Deleted

## 2011-03-08 DIAGNOSIS — I4891 Unspecified atrial fibrillation: Secondary | ICD-10-CM

## 2011-03-08 LAB — POCT INR: INR: 2.6

## 2011-03-18 ENCOUNTER — Ambulatory Visit (INDEPENDENT_AMBULATORY_CARE_PROVIDER_SITE_OTHER): Payer: 59 | Admitting: *Deleted

## 2011-03-18 DIAGNOSIS — I4891 Unspecified atrial fibrillation: Secondary | ICD-10-CM

## 2011-03-18 LAB — POCT INR: INR: 2.9

## 2011-03-25 ENCOUNTER — Ambulatory Visit (INDEPENDENT_AMBULATORY_CARE_PROVIDER_SITE_OTHER): Payer: 59 | Admitting: *Deleted

## 2011-03-25 DIAGNOSIS — I4891 Unspecified atrial fibrillation: Secondary | ICD-10-CM

## 2011-03-28 ENCOUNTER — Telehealth: Payer: Self-pay | Admitting: *Deleted

## 2011-03-28 DIAGNOSIS — Z7901 Long term (current) use of anticoagulants: Secondary | ICD-10-CM

## 2011-03-28 DIAGNOSIS — I4891 Unspecified atrial fibrillation: Secondary | ICD-10-CM

## 2011-03-28 NOTE — Telephone Encounter (Signed)
Pt is now ready for cardioversion. Pt states he is free to do the DCCV any day. He is aware we will d/w Dr. Diona Browner to determine which day will be best with his schedule and pt will be notified tomorrow of appt information for cardioversion.  Pt will need updated note completed for DCCV.

## 2011-03-29 ENCOUNTER — Encounter: Payer: Self-pay | Admitting: *Deleted

## 2011-03-29 NOTE — Telephone Encounter (Signed)
This encounter was created in error - please disregard.

## 2011-03-29 NOTE — Progress Notes (Addendum)
Addendum to original note of office visit April 4. Patient has had 4 adequate PT/INR levels on Coumadin, and is now going to be scheduled for elective DCCV, likely early next week. No other major interval change in the patient's history since last office visit. Interval vital signs and examination to follow on day of procedure.

## 2011-03-29 NOTE — Telephone Encounter (Signed)
Cardioversion will be Monday, May 21st. Pt aware and verbalized understanding. He will keep appt w/Lisa on Friday and receive pre-procedure folder at that visit.

## 2011-03-29 NOTE — Discharge Summary (Signed)
NAME:  Clifford King, Clifford King NO.:  [id number]   MEDICAL RECORD NO.:  [id number]          PATIENT TYPE:  INP   LOCATION:  6527                         FACILITY:  [facility]   PHYSICIAN:  Luis Abed, MD, FACCDATE OF BIRTH:  [birth year]   DATE OF ADMISSION:  02/05/2008  DATE OF DISCHARGE:  02/06/2008                               DISCHARGE SUMMARY   PRIMARY CARDIOLOGIST:  Jonelle Sidle, MD.   PRIMARY CARE PHYSICIAN:  Youlanda Mighty, MD.   PROCEDURES PERFORMED DURING HOSPITALIZATION:  Cardiac catheterization:  A.  CAD with total LAD.  B.  LIMA to LAD patent.  C.  SVG to diagonal patent with normal LV function at 65%, no obvious  culprit lesion to explain symptoms, and positive stress test.   FINAL DISCHARGE DIAGNOSES:  1. Noncardiac chest pain.  2. History of coronary artery disease:      a.     Status post stent and brachytherapy to the left anterior       descending in 2003.      b.     Status post coronary artery bypass grafting with left       internal mammary artery to left anterior descending and saphenous       vein graft to diagonal coronary artery in 2004.  3. Hypertension.  4. Dyslipidemia.  5. History of carotid bruits.   HOSPITAL COURSE:  This is a 70 year old Caucasian male, patient of Dr.  Nona Dell, with known history of coronary artery disease and  coronary artery bypass grafting, who was complaining of progressive  chest discomfort with a feeling of burning in the left chest with  activity such as walking up steps or walking a long distance.  The  patient was seen in the office and had a stress Myoview revealing  hypertensive response on treadmill with a blood pressure of 229/72 with  substernal chest pain at peak exertion with significant ST-segment  changes occurred within the first 3 minutes of exertion.  The ST-segment  changes were in the inferolateral leads.  These changes persisted for  several minutes into the recovery phase with  frequent PVCs and couplets  and triplets.  During rest, after procedure was completed and before  stress images were taken, the patient had recurrence of chest  discomfort, which was relieved with sublingual nitroglycerin.  The  patient was seen and examined during that time by Dr. Lewayne Bunting during  this episode and admitted for further evaluation.  It was found that the  patient would be a candidate for repeat catheterization and transferred  to [facility] for cardiac catheterization.   The patient was seen and examined by Dr. Tonny Bollman prior to cardiac  catheterization and catheterization was completed by Dr. Arvilla Meres. Catheterization results as discussed above.  Please see Dr.  Prescott Gum serial cardiac catheterization note for more details.  The  patient recovered from cardiac catheterization well without any more  complaints of chest pain.  No shortness of breath.  The right groin site  was found to be healthy without signs of hematoma, swelling, bruising,  pain, or  bruit.  The patient was seen and examined by Dr. Willa Rough  in the morning of discharge and found to be stable.  The patient was  returned home on current medication regimen to include nitroglycerin  sublingual p.r.n.  Also, the patient had a D-dimer during his  hospitalization, which was found to be negative.  The patient will  follow with Dr. Nona Dell on a previously scheduled appointment in  [place].   DISCHARGE LABS:  Sodium 137, potassium 3.4, chloride 103, CO2 26,  glucose 116, BUN 7, creatinine 0.91, hemoglobin 14.2, hematocrit 40.4,  white blood cells 4.7, and platelets are 135.  Troponin found to be  negative at 0.03.  PTT 29, PT 14.4, INR 1.1, and TSH 0.763, D-dimer less  than 0.22, magnesium 2.3.  Initial troponin on admission was 0.10 with  CK of 115 and CK-MB of 3.6.   VITAL SIGNS ON DISCHARGE:  Blood pressure 140/51, heart rate 61,  respirations 18, O2 sat 98% on room air,  and temperature 98.6.   DISCHARGE MEDICATIONS:  1. Aspirin 81 mg daily.  2. Lisinopril 20 mg daily.  3. Allopurinol 100 mg daily.  4. Plavix 75 mg daily.  5. Zocor 20 mg at bedtime.  6. Norvasc 5 mg daily.   ALLERGIES:  No known drug allergies.   FOLLOWUP PLANS AND APPOINTMENT:  1. The patient is to follow up with Dr. Nona Dell on a      previously scheduled appointment on February 14, 2008, at 1 p.m.  2. The patient should follow up with his primary care physician for      continued medical management.  3. The patient has been given post cardiac catheterization      instructions with particular emphasis on the right groin site for      evidence of bleeding, hematoma, or signs of infection.   Time spent with the patient to include physician time of 30 minutes.      Bettey Mare. Lyman Bishop, NP      Luis Abed, MD, [facility]  Electronically Signed    KML/MEDQ  D:  02/06/2008  T:  02/07/2008  Job:  [id number]   cc:   Youlanda Mighty, MD

## 2011-03-29 NOTE — Assessment & Plan Note (Signed)
[facility]                          [facility]   Clifford, King                   MRN:          [id number]  DATE:04/10/2009                            DOB:          19-Dec-1940    REASON FOR VISIT:  Scheduled followup.   Clifford King reports no significant change from his chronic, stable  anginal pattern.  Specifically, this consists of a burning sensation  overlying the left precordium, which is precipitated by moderate  exertion and always resolves with rest.  When he resumes the very same  activity after only a few minutes, he does not have any recurrent  symptoms.  Of note, he does not have any rest angina.  He has not taken  any nitroglycerin.  He denies any tachy palpitations.   EKG in our office today indicates sinus bradycardia, 59 bpm with normal  axis, no ischemic changes.   CURRENT MEDICATIONS:  1. Toprol-XL 25 daily.  2. Aspirin 81 daily.  3. Lisinopril 20 daily.  4. Allopurinol 100 daily.  5. Plavix.  6. Lipitor 10 daily.  7. Norvasc 5 daily.   PHYSICAL EXAMINATION:  VITAL SIGNS:  Blood pressure 178/67; pulse 60,  regular; weight 323.  GENERAL:  A 70 year old male, obese, sitting upright, no distress.  HEENT:  Normocephalic, atraumatic.  NECK:  Palpable carotid pulses with bilateral bruits; no JVD.  LUNGS:  Clear to auscultation in all fields.  HEART:  Regular rate and rhythm.  No significant murmurs.  ABDOMEN:  Protuberant, nontender.  EXTREMITIES:  Bilateral lower extremity edema 1+, with pressure  stockings.  NEURO:  No focal deficit.   IMPRESSION:  1. Stable chronic angina.      a.     Stable coronary anatomy with widely patent LIMA-LAD and SVG-       DX grafts; known, 100% occlusion of small, nondominant RCA with       diffuse distal disease.      b.     EF 60%; no wall motion abnormalities.      c.     Status post NSTEMI/2v CABG in 2004.  2. Hypertension, controlled.  3. Nonobstructive  cerebrovascular disease.  4. Dyslipidemia.  5. History of postoperative atrial fibrillation.      a.     Status post successful DCCV to NSR.  6. Morbid obesity.   PLAN:  1. Increase Norvasc to 10 mg daily, both for antianginal benefit as      well as more aggressive blood pressure control.  2. Continue aggressive lipid management with target LDL of 70 or less,      if feasible.  3. Schedule follow up in 6 months.      Clifford Searing, PA-C  Electronically Signed      Jonelle Sidle, MD  Electronically Signed   GS/MedQ  DD: 04/10/2009  DT: 04/11/2009  Job #: [id number]   cc:   Dr. Winona Legato

## 2011-03-29 NOTE — Cardiovascular Report (Signed)
NAME:  BIRT, REINOSO NO.:  [id number]   MEDICAL RECORD NO.:  [id number]          PATIENT TYPE:  INP   LOCATION:  6527                         FACILITY:  [facility]   PHYSICIAN:  Bevelyn Buckles. Bensimhon, MDDATE OF BIRTH:  [birth year]   DATE OF PROCEDURE:  02/05/2008  DATE OF DISCHARGE:                            CARDIAC CATHETERIZATION   PATIENT IDENTIFICATION:  Clifford King is a delightful 70 year old male  with a history of coronary artery disease.  He is status post multiple  interventions on his LAD with in-stent restenosis and finally underwent  bypass grafting with a LIMA to the LAD and a saphenous vein graft to the  diagonal back in 2004.  He has been having progressive chest burning.  He underwent stress testing today with Dr. Andee Lineman and had an early  positive stress test and was referred for cardiac catheterization.  There was evidence of partially reversible mid to basal inferior wall  defect.   PROCEDURES PERFORMED:  1. Selective coronary angiography.  2. Saphenous vein graft angiography.  3. LIMA angiography.  4. Subclavian angiography.  5. Left heart cath.  6. Left ventriculogram.  7. StarClose femoral artery closure.   DESCRIPTION OF PROCEDURE:  The risks and indication of the  catheterization were explained.  Consent was signed and placed on the  chart.  A 6-French arterial sheath was placed in the right femoral  artery using a modified Seldinger technique.  Standard catheters,  including JL-4, JR-4 and angled pigtail, were used for the procedure.  All catheters were exchanged over wire.  There were no apparent  complications.  At the end of the procedure, the right femoral  arteriotomy site was closed with a StarClose device.  There was good  hemostasis.   Central aortic pressure 145/58 with a mean of 85.  LV pressure 158/11  with an EDP of 20 to 25.  There was no aortic stenosis.   Left main was normal.   LAD was totally occluded ostially.   Left circumflex was a large dominant vessel.  It gave off a small OM1, a  large branching OM2, 2 posterolaterals and a posterior descending  artery.  There was a 30% lesion in the proximal portion of the OM2,  otherwise normal.   Right coronary artery was a small nondominant vessel with diffuse distal  disease.   The LIMA to the LAD was widely patent with minor luminal irregularities  in the native LAD.   Saphenous vein graft to the diagonal was widely patent.   Left ventriculogram done in the RAO position showed an EF of 65% with no  regional wall motion abnormalities.   ASSESSMENT:  1. Coronary artery disease as described above with chronic total      occlusion of the left anterior descending.  2. Left internal mammary artery to the left anterior descending is      widely patent.  3. Saphenous vein graft to the diagonal was widely patent.  4. Left subclavian is free of any proximal stenoses.  5. Normal left ventricular function.   PLAN/DISCUSSION:  Despite his stress test, cannot find any obvious  culprit  lesion to explain his symptoms.  We will check a D-dimer to rule  out possible pulmonary embolus.  I suspect this will be negative.   Will plan possible discharge in the a.m.  Can consider cardiac rehab if  he continues to have symptoms.      Bevelyn Buckles. Bensimhon, MD  Electronically Signed     DRB/MEDQ  D:  02/05/2008  T:  02/05/2008  Job:  [id number]

## 2011-03-29 NOTE — Assessment & Plan Note (Signed)
[facility]                          [facility] NOTE   NTHONY, LEFFERTS                   MRN:          [id number]  DATE:09/15/2008                            DOB:          [birth year]    PRIMARY CARE PHYSICIAN:  Dr. Winona Legato in [place], [place].   REASON FOR VISIT:  Cardiac followup.   HISTORY OF PRESENT ILLNESS:  Mr. Knaus was seen back in April.  He  has a history of cardiovascular disease status post coronary artery  bypass grafting with a LIMA to the left anterior descending and  saphenous vein graft to the diagonal.  Native disease includes known  occlusion of the left anterior descending with normal left main,  dominant circumflex with mild atherosclerosis, and a small nondominant  right coronary artery with diffuse disease.  His most recent angiogram  in March of this year demonstrated patent graft and we have been  managing him medically.  He does report exertional angina although is  describing a stable pattern.  He is not having use of any some  sublingual nitroglycerin stating that his symptoms readily dissipate  when he rests for just a few minutes at most.  He is no longer on a long-  acting nitrate and is not on beta-blocker.  Otherwise, he reports lipid  followup with Dr. Winona Legato and remains on Lipitor.  He is also on Plavix.  He is not having any bleeding problems.   ALLERGIES:  No known drug allergies.   PRESENT MEDICATIONS:  1. Norvasc 5 mg p.o. daily.  2. Lipitor 10 mg p.o. daily.  3. Plavix 75 mg p.o. daily.  4. Allopurinol 100 mg p.o. daily.  5. Lisinopril 20 mg p.o. daily.  6. Aspirin 81 mg p.o. daily.  7. Sublingual nitroglycerin 0.4 mg p.r.n.   Review of systems is outlined above.  He states he is walking  approximately 2 miles a day, resting between miles.  He is not having  any claudication, palpitations, or syncope.  Otherwise, negative.   PHYSICAL EXAMINATION:  VITAL SIGNS:  Weight is 314  pounds, height 6 feet  5 inches, blood pressure 125/77, heart rate is 80-90 in sinus rhythm  with ectopic beats.  GENERAL:  This is an obese male in no acute distress.  HEENT:  Conjunctiva is normal.  Oropharynx is clear.  NECK:  Supple.  No elevated jugular venous pressure.  No loud bruits.  No thyromegaly.  LUNGS:  Clear without breathing at rest.  CARDIAC:  Regular rate and rhythm with ectopic beats.  Soft systolic  murmur.  No diastolic murmur or pericardial rub.  No S3 or gallop.  ABDOMEN:  Soft, nontender.  Good bowel sounds.  EXTREMITIES:  Exhibit no frank pitting edema.  His pulses are 2+.  SKIN:  Warm and dry.  MUSCULOSKELETAL:  No kyphosis noted.  NEUROPSYCHIATRIC:  The patient is alert and oriented x3.  Affect is  appropriate.   IMPRESSION/RECOMMENDATIONS:  1. Multivessel cardiovascular disease as outlined above with preserved      ejection fraction.  Mr. Narez is describing stable angina.  He  otherwise seems to be tolerating things well.  I will add Toprol-XL      25 mg p.o. daily to his regimen.  He will let us know if he has      trouble tolerating this.  Otherwise, I will plan to see him back in      the next 6 months.  2. Hyperlipidemia, on statin therapy, followed by Dr. Winona Legato.  We would      aim for aggressive LDL control around 70.  3. History of previously documented postoperative atrial fibrillation      status post cardioversion with maintenance of normal sinus rhythm.      Would be aware of any significant palpitations or progressive      breathlessness.     Jonelle Sidle, MD  Electronically Signed    SGM/MedQ  DD: 09/15/2008  DT: 09/15/2008  Job #: [id number]   cc:   Dr. Winona Legato in [place], [place]

## 2011-03-29 NOTE — Assessment & Plan Note (Signed)
[facility]                          [facility] NOTE   MAKYI, LEDO                   MRN:          [id number]  DATE:05/21/2007                            DOB:          [birth year]    PRIMARY CARE PHYSICIAN:  Hadassah Pais, MD   REASON FOR VISIT:  Cardiac followup.   HISTORY OF PRESENT ILLNESS:  I saw Mr. Coltrin back in December of  2007. He is doing relatively well from a cardiac perspective. He is not  having any significant angina or dyspnea on exertion. He has been trying  to do some walking, but has gained some weight since this last visit.  Today, we talked about diet. He describes having problems with left-  sided neck and head discomfort that is exacerbated by adducting his left  shoulder and also turning his head to the right. His description seems  very consistent with neuropathic discomfort. He reports seeing an  otolaryngologist and also a dentist with no other firm diagnosis. He had  a carotid duplex scan back in December of 2007, that revealed mild  bilateral plaque in the internal carotids without any significant flow-  limiting stenoses.   ALLERGIES:  No known drug allergies.   PRESENT MEDICATIONS:  1. Aspirin 81 mg p.o. daily.  2. Lisinopril 20 mg p.o. daily.  3. Allopurinol 100 mg p.o. daily.  4. Plavix 75 mg p.o. daily.  5. Lipitor 10 mg p.o. daily.  6. Norvasc 5 mg p.o. daily.   REVIEW OF SYSTEMS:  As described in History of Present Illness.  Otherwise, negative.   PHYSICAL EXAMINATION:  Blood pressure 158/72, heart rate 68, weight is  322 pounds. This is a morbidly obese male in no acute distress.  NECK: Reveals soft left carotid bruit. No thyromegaly is noted.  LUNGS:  Are clear without labored breathing.  CARDIAC: Reveals a regular rate and rhythm without S3 gallop. No  significant systolic murmur.  EXTREMITIES: Show no significant pitting edema.   IMPRESSIONS AND RECOMMENDATIONS:  1. Coronary  artery disease, status post previous non-ST elevation      myocardial infarction with two vessel coronary artery bypass      grafting in April 2004, associated with normal left ventricular      systolic function. He had undergone previous stenting of the left      anterior descending. He is not experiencing any angina or      progressive dyspnea at this point. Our plan will be continued      observation on medical therapy. I will plan to see him back in the      next six months.  2. 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 I have      arranged a referral to Dr.      Lorn Junes for further evaluation. My understanding is that he has had      a previous MRI of the spine, although I am not certain that this      included the cervical spine.     Jonelle Sidle, MD  Electronically Signed    SGM/MedQ  DD: 05/21/2007  DT: 05/21/2007  Job #: [id number]   cc:   Hadassah Pais, MD

## 2011-03-29 NOTE — Assessment & Plan Note (Signed)
[facility]                          [facility] NOTE   DONTRAIL, BLACKWELL                   MRN:          [id number]  DATE:01/22/2008                            DOB:          05/16/1941    PRIMARY CARE PHYSICIAN:  Dr. Donalee Citrin in [place], [place].   REASON FOR VISIT:  Cardiac follow-up.   HISTORY OF PRESENT ILLNESS:  I saw Mr. Goedken  back in July of last  year.  He reports symptoms suggestive of exertional angina that have  been progressive over the last several months.  He states he feels a  burning in the left side of his chest with activities such as walking  up steps or walking a long distance.  These rapidly resolve when he  stops.  He has not had to use any sublingual nitroglycerin.  Today's  electrocardiogram is normal, showing sinus rhythm at 62 beats per  minute.  His last Myoview was in December 2007 demonstrating a partially  reversible inferior defect with inferior basal akinesis and an ejection  fraction of 47%, suggestive of a combination of scar and small degree of  peri-infarct ischemia.   ALLERGIES:  NO KNOWN DRUG ALLERGIES.   MEDICATIONS:  Aspirin 81 mg p.o. daily, lisinopril 20 mg p.o. daily,.  allopurinol 5 mg p.o. daily, Plavix 75 mg p.o. daily, Lipitor 10 mg p.o.  daily, Norvasc 5 mg p.o. daily.   REVIEW OF SYSTEMS:  As in history of present illness.  He has had some  arthritic knee discomfort.  No palpitations or syncope.  Otherwise  systems are negative.   EXAMINATION:  Blood pressure 153/76, heart rate 62, weight is 316  pounds.  This is an obese male in no acute distress.  HEENT:  Conjunctiva, lids normal.  Oropharynx is clear.  NECK is supple.  No elevated jugular venous pressure, no loud bruits.  LUNGS are clear without labored breathing.  CARDIAC exam reveals a regular rate and rhythm.  There is a soft  systolic murmur at the base.  No S3 gallop or pericardial rub.  ABDOMEN:  Soft,  nontender.  EXTREMITIES:  Exhibit no frank pitting edema.  Distal pulses are 2+.  SKIN:  Warm and dry.  MUSCULOSKELETAL:  No kyphosis is noted.  NEUROPSYCHIATRIC the patient is alert and oriented x3.  Affect is  appropriate.   IMPRESSION/RECOMMENDATION:  Coronary artery disease status post previous  non-ST elevation myocardial infarction with subsequent two-vessel  coronary artery bypass grafting in  2004.  The patient is describing exertional angina.  I provided  prescriptions for both sublingual  nitroglycerin as well as beginning isosorbide dinitrate 20 mg p.o.  b.i.d.Marland Kitchen  I will refer him for exercise Myoview and have him return to  the office to discuss the results.     Jonelle Sidle, MD  Electronically Signed    SGM/MedQ  DD: 01/22/2008  DT: 01/22/2008  Job #: [id number]

## 2011-03-29 NOTE — Assessment & Plan Note (Signed)
[facility]                          [facility] NOTE   Clifford King, Clifford King                   MRN:          [id number]  DATE:02/14/2008                            DOB:          [birth year]    REASON FOR VISIT:  Post hospital followup.   Clifford King returns to our clinic after a brief hospitalization at  [facility] for evaluation of an abnormal exercise stress Cardiolite.  This was ordered by Dr. Diona Browner, at time of patient's previous office  visit, for further evaluation of recurrent left-sided chest burning.  The stress test was abnormal with 2-mm horizontal, ST-segment depression  in the inferolateral leads.  The patient also had nonsustained  ventricular tachycardia.  There was a medium-sized, partially reversible  defect in the basal and inferior wall, consistent with ischemia in  addition to prior myocardial infarction.  Dr. Andee Lineman recommended direct  transfer to [facility] for further evaluation.   Cardiac catheterization, however, showed no culprit lesion with  continued wide patency of the LIMA/LAD and SVG/diagonal grafts.  There  was a chronic occlusion of the LAD. Left ventricular function was  normal.   Post catheterization, the patient had a D-dimer which was negative.  He  was discharged home on his previous medications.   Clinically, he reports feeling quite good and denies any exertional  angina pectoris.  He does, however, continue to have this intermittent  left-sided chest burning, which appears to be positional.  It is  clearly not precipitated by exertion and, interestingly, is relieved  when lying supine.  It also seems to occur after meals, but he does not  have this when lying supine in bed at night.  The patient also denies  any recent fever or chills.   The patient denies any complications of the right groin incision site.   Clifford King did complain of some positional lightheadedness; however,  orthostatic blood pressure readings in our office showed no significant  change in either systolic blood pressure or pulse.   Electrocardiogram today reveals NSR/first-degree AV block at 65 bpm with  normal axis and no ischemic changes.   CURRENT MEDICATIONS:  1. Norvasc 5 daily.  2. Lipitor 10 mg daily.  3. Plavix.  4. Allopurinol 100 daily.  5. Lisinopril 20 daily.  6. Aspirin 81 daily.  7. Isosorbide 20 b.i.d.   PHYSICAL EXAMINATION:  VITAL SIGNS:  Blood pressure 140/60, pulse 65,  regular.  Weight 313.4.  GENERAL:  A 70 year old male, moderately obese, sitting upright in no  stress.  HEENT:  Normocephalic, atraumatic.  NECK:  Palpable bilateral pulses, bilateral, low-pitched bruits.  LUNGS:  Clear to auscultation bilaterally.  HEART:  Regular rate and rhythm (S1, S2).  No significant murmurs, no  rubs.  ABDOMEN:  Protuberant, nontender, intact bowel sounds.  EXTREMITIES:  Right groin stable with no ecchymosis, hematoma or bruit  on auscultation.  Intact femoral and distal pulses.  NEUROLOGIC:  No focal deficits.   IMPRESSION:  1. Noncardiac chest pain.      a.     Stable coronary anatomy with widely patent LIMA/LAD and  SVG/diagonal grafts and known, 100% occlusion of the LAD by recent       cardiac catheterization.      b.     Normal left ventricular function.  2. Bilateral carotid bruits.      a.     Nonobstructive cerebrovascular disease by carotid Dopplers,       December 2007.  3. Dyslipidemia.  4. Obesity.  5. History of postop atrial fibrillation.      a.     Status post successful DCCV to normal sinus rhythm.   PLAN:  1. A 2-D echocardiogram for reassessment of left ventricular function      and to exclude pericardial effusion, the latter as possible      etiology for his atypical, positional chest discomfort.  Also, his      ejection fraction was calculated as 49% by his recent stress test.      However, this was interpreted as 65% with no associated  wall motion      abnormalities, by recent cardiac catheterization.  2. Surveillance carotid Dopplers, with his last study having been in      December 2007.  3. Aggressive lipid management per Dr. Donalee Citrin, with target LDL of 70      or less.  4. Schedule return clinic followup with myself and Dr. Diona Browner in 6      months.      Clifford Searing, PA-C  Electronically Signed      Jonelle Sidle, MD  Electronically Signed   GS/MedQ  DD: 02/14/2008  DT: 02/14/2008  Job #: [id number]   cc:   Dr. Sena Slate, [place]

## 2011-03-30 ENCOUNTER — Telehealth: Payer: Self-pay | Admitting: *Deleted

## 2011-03-30 NOTE — Telephone Encounter (Signed)
Cardioversion scheduled for 04-04-2011 @ [facility]. Checking percert

## 2011-03-30 NOTE — Telephone Encounter (Signed)
NO PRECERT REQUIRED FOR CARDIOVERSION WITH [employer]

## 2011-03-30 NOTE — Telephone Encounter (Signed)
Message copied by Zachary George on Wed Mar 30, 2011  8:21 AM ------      Message from: Cyril Loosen      Created: Tue Mar 29, 2011  3:10 PM      Regarding: RE: pre-cert for dccv       See below...it is scheduled for 5/21 w/Dr. Diona Browner at [facility].                        ----- Message -----         From: Zachary George         Sent: 03/29/2011   2:14 PM           To: Cyril Loosen, RN      Subject: RE: pre-cert for dccv                                          Are you ordering the cardioverson?             ----- Message -----         From: Cyril Loosen, RN         Sent: 03/29/2011   2:09 PM           To: Zachary George      Subject: pre-cert for dccv                                                    Order        Cardioversion external [CAR31] (Order [id number])                           Order Providers                 Authorizing Encounter         Jonelle Sidle                                                         Original Order                 Ordered On Ordered By                Tue Mar 29, 2011 2:06 PM Cyril Loosen, RN                                 Associated Diagnoses          Atrial fibrillation                                         Cardioversion at [facility]      Monday, Apr 04, 2011      Arrive at 11am for 1pm procedure.      Dr. Diona Browner      Pt needs PT/INR/BMET before procedure.

## 2011-04-01 ENCOUNTER — Ambulatory Visit (INDEPENDENT_AMBULATORY_CARE_PROVIDER_SITE_OTHER): Payer: 59 | Admitting: *Deleted

## 2011-04-01 DIAGNOSIS — I4891 Unspecified atrial fibrillation: Secondary | ICD-10-CM

## 2011-04-01 NOTE — Cardiovascular Report (Signed)
NAME:  Clifford King, Clifford King NO.:  [id number]   MEDICAL RECORD NO.:  [id number]                   PATIENT TYPE:  INP   LOCATION:  2019                                 FACILITY:  [facility]   PHYSICIAN:  Salvadore Farber, M.D.             DATE OF BIRTH:  1941-01-08   DATE OF PROCEDURE:  02/14/2003  DATE OF DISCHARGE:  02/24/2003                              CARDIAC CATHETERIZATION   PROCEDURE:  Left heart catheterization, left ventriculography, coronary  angiography.   INDICATIONS:  The patient is a 70 year old gentleman status post non Q-wave  myocardial infarction in March 2003 at which time he underwent stenting of  proximal LAD lesion.  He suffered restenosis and underwent cutting balloon  angioplasty and brachytherapy of this lesion in June 2003.  He presents with  one week of chest discomfort occurring with very minimal exertion.  He was  admitted to hospital and ruled out for myocardial infarction.  Referred for  diagnostic angiography.   PROCEDURAL TECHNIQUE:  Informed consent was obtained.  Under 1% lidocaine  local anesthesia a 6-French sheath was placed in the right femoral artery  using the modified Seldinger technique.  Diagnostic angiography and  ventriculography were performed using JL4, JR4, and pigtail catheters.  The  JR4 catheter was pulled back and engaged in the left subclavian artery.  Subclavian angiography was performed by hand injection.  The patient  tolerated the procedure well and was transferred to the holding room in  stable condition.  Sheaths are to be removed there.   COMPLICATIONS:  None.   FINDINGS:  1. LV 179/12/29.  EF 60% without regional wall motion abnormality.  2. No aortic stenosis or mitral regurgitation.  3. Left main:  Angiographically normal.  4. LAD:  The LAD is a large vessel giving rise to a single large diagonal     branch.  The LAD has an ostial plaque which is ruptured with 80%     stenosis.  In  addition, the stent to the proximal LAD has approximately     60% instent restenosis.  The large diagonal has a proximal 50% stenosis     and a 70% stenosis of its mid section.  5. Circumflex:  This circumflex is a very large, dominant vessel.  There are     three obtuse marginals and a PDA.  There are only luminal irregularities.  6. RCA:  The RCA is a diffusely diseased, small, nondominant vessel.  There     is 99% stenosis in its mid section.  7. Left subclavian:  No stenosis.   IMPRESSION/RECOMMENDATIONS:  The patient presents with unstable angina due  to an ostial left anterior descending ruptured plaque.  Due to relatively  high risk of this lesion for PCI and the recurrent instent restenosis after  brachytherapy of the proximal left anterior descending, I favor coronary  artery bypass grafting.  Will therefore refer to [facility] for consideration of  this therapy.                                               Salvadore Farber, M.D.    WED/MEDQ  D:  05/14/2003  T:  05/14/2003  Job:  [id number]

## 2011-04-01 NOTE — Op Note (Signed)
NAME:  Clifford King, Clifford King NO.:  [id number]   MEDICAL RECORD NO.:  [id number]                   PATIENT TYPE:  INP   LOCATION:  2019                                 FACILITY:  [facility]   PHYSICIAN:  Gwenith Daily. Tyrone Sage, M.D.            DATE OF BIRTH:  [birth year]   DATE OF PROCEDURE:  02/17/2003  DATE OF DISCHARGE:                                 OPERATIVE REPORT   PREOPERATIVE DIAGNOSIS:  Coronary artery disease with restenosis following  stent placement and brachytherapy on the left anterior descending diagonal  system.   POSTOPERATIVE DIAGNOSIS:  Coronary artery disease with restenosis following  stent placement and brachytherapy on the left anterior descending diagonal  system.   PROCEDURE:  Off-pump coronary artery bypass grafting x2 with the left  internal mammary to the left anterior descending coronary artery and  reversed saphenous vein graft to the diagonal coronary artery.   SURGEON:  Gwenith Daily. Tyrone Sage, M.D.   ASSISTANT:  Kerin Perna, M.D.   BRIEF HISTORY:  The patient is a 70 year old male who had previously  presented with anginal symptoms and was found to have critical LAD and  diagonal obstructions.  He had undergone angioplasty and stent placement in  the past and subsequently had a restenosis treated with repeat angioplasty  and brachytherapy.  The patient initially did well but after several months  had recurrent episodes of chest pain, which became progressively worse,  precipitating admission.  Repeat cardiac catheterization revealed a very  small right coronary artery lesion with stenoses but the distal vessel was  less than 1 mm.  In addition, he had greater than 80% in-stent stenosis of  the proximal LAD involving the diagonal.  Because of his recurrent angina  with restenosis and failure of angioplasty stent treatment, coronary artery  bypass grafting was recommended to the patient, who signed informed consent.   DESCRIPTION OF PROCEDURE:  With Swan-Ganz and arterial line monitors in  place, the patient underwent general endotracheal anesthesia without  incident.  The skin of the chest and legs was prepped with Betadine and  draped in the usual sterile manner.  A segment of vein was harvested from  the left lower leg and was of good quality and caliber.  A median sternotomy  was performed.  The left internal mammary artery was dissected down as a  pedicle graft.  The distal artery was divided and had good, free flow.  The  pericardium was opened.  Overall ventricular function appeared preserved.  The patient was systemically heparinized.  With the Guidant off-pump bypass  system, a suction cup device was placed on the apex of the heart to elevate  the heart out of the chest cavity, and the vessel loops were placed around  the proximal portion of the diagonal coronary artery.  The suction cup  stabilizing device was then positioned and used to stabilize the heart.  A  small opening was made  in the vessel.  Vessel loops were tightened.  The  main aortotomy was completed.  Using a running 7-0 Prolene, anastomosis was  performed with a segment of reversed saphenous vein graft.  The tapes were  then removed.  A partial occlusion clamp was placed on the ascending aorta  and the single vein graft was anastomosed to the ascending aorta.  The aorta  was backward from the grafts and blood flow restored down the vein graft.  With this, the patient remained hemodynamically stable during this.  The  stabilization devices were then reattached and in a similar fashion, the  left internal mammary artery was anastomosed to the left anterior descending  coronary artery.  With the anastomosis completed, the fascia was tacked to  the epicardium.  The patient remained hemodynamically stable with stable EKG  findings.  Doppler flow was evident through the grafts.  Protamine sulfate  was administered to the patient.  Two  atrial and two ventricular pacing  wires were applied.  A graft marker was applied.  The pericardium was  loosely reapproximated, a left pleural tube and two mediastinal tubes left  in place.  The sternum was closed with #6 stainless steel wire, the fascia  was closed with interrupted 0 Vicryl, running 3-0 Vicryl in the subcutaneous  tissue, 4-0 subcuticular stitch in the skin edges.  Dry dressing was  applied.  Sponge and needle count was reported as correct at completion of  the procedure.  The patient tolerated the procedure without adverse  complication and was transferred to the surgical intensive care unit for  further postoperative care.                                               Gwenith Daily Tyrone Sage, M.D.    Tyson Babinski  D:  02/18/2003  T:  02/19/2003  Job:  [id number]   cc:   Salvadore Farber, M.D. [facility]

## 2011-04-01 NOTE — Assessment & Plan Note (Signed)
[facility]                          [facility] NOTE   Clifford King, Clifford King                   MRN:          [id number]  DATE:10/18/2006                            DOB:          Jan 13, 1941    PRIMARY CARE PHYSICIAN:  Hadassah Pais, M.D.   REASON FOR VISIT:  Left arm and leg discomfort.   HISTORY OF PRESENT ILLNESS:  I saw Clifford King back in June of this  year.  He requested a followup visit, given some complaints of  discomfort in his left neck to left arm and upper left chest to some  degree that has been somewhat sporadic but progressive over the last 6  months.  He has also had a paresthesia-like pain in his left leg.  He  has had no frank precordial chest pain, and has otherwise been compliant  with his medications.  He tells me that he saw a physician recently with  lower extremity arterial studies apparently being normal, per the  patient's report.  We talked about the possibility today that this could  be neuropathic pain, perhaps due to cervical and/or lumbar disk disease.  Having said that, he has not had any followup ischemic testing recently.  He also has a history of coronary artery disease without recent followup  ultrasound.   ALLERGIES:  No known drug allergies.   PRESENT MEDICATIONS:  1. Aspirin 81 mg p.o. daily.  2. Lisinopril 20 mg p.o. daily.  3. Allopurinol 100 mg p.o. daily.  4. Plavix 75 mg p.o. daily.  5. Lipitor 10 mg p.o. daily.  6. Norvasc 5 mg p.o. daily.   REVIEW OF SYSTEMS:  As described in the history of present illness.  He  had no palpitations or syncope.   PHYSICAL EXAMINATION:  VITAL SIGNS:  Blood pressure today is 140/72,  heart rate of 68, weight is 313 pounds.  GENERAL:  This is a morbidly obese male in no acute distress.  HEENT:  Conjunctivae normal.  Oropharynx is clear.  NECK:  Supple without elevated jugular venous pressure, without bruits.  LUNGS:  Clear without labored breathing at  rest.  CARDIAC:  A regular rate and rhythm without murmur or gallop.  No  obvious hepatomegaly.  Bowel sounds are present.  EXTREMITIES:  No significant pitting edema.  Distal pulses are 2+.  SKIN:  Warm and dry.  No clubbing or cyanosis.   IMPRESSION AND RECOMMENDATIONS:  1. Multivessel coronary disease, status post coronary artery bypass      grafting in 2004 with an off-pump left internal mammary artery to      the left anterior descending and saphenous vein graft to the      diagonal.  He is describing symptoms of left neck, arm, chest and      leg discomfort that are fairly atypical; however, he has not had      recent followup ischemic testing.  I will therefore plan an 2-day      adenosine Cardiolite on medical therapy, and, in addition, a      followup carotid duplex, given a previously documented history of  carotid artery disease.  If this is reassuring, I would anticipate      medical therapy.  He has a followup visit scheduled to see Dr. Zola Button      in the near future, and it may be that he needs evaluation of      possible spinal disk disease.  2. Further plans to follow.     Jonelle Sidle, MD  Electronically Signed    SGM/MedQ  DD: 10/18/2006  DT: 10/19/2006  Job #: [id number]   cc:   Hadassah Pais, M.D.

## 2011-04-01 NOTE — Assessment & Plan Note (Signed)
[facility]                          [facility] NOTE   Clifford King, Clifford King                   MRN:          [id number]  DATE:11/09/2006                            DOB:          09/07/1941    PRIMARY CARDIOLOGIST:  Dr. Simona Huh.   REASON FOR OFFICE VISIT:  Scheduled office followup. Please refer to Dr.  Ival Bible office note of December 5 for full details.   The patient now presents for followup of a recent adenosine stress  Cardiolite for further evaluation of recurrent left chest discomfort  with occasional radiation down the left arm. The patient has known  coronary artery disease and is status post two vessel CABG in April  2004.   During adenosine perfusion the patient reported no chest pain, and  perfusion imaging demonstrated a small/moderate sized inferior defect  which was partially reversible. There was also inferior basal akinesis.   These images were reviewed by Dr. Diona Browner. Patient is brought back for  discussion of these results and further evaluation.   He continues to report this chest burning which he states has plagued  him ever since he experienced a fall a year after his wife had surgery.  He thinks it is due to a pulled muscle and can last as long as 1-2  days in duration. In fact he states that he is currently having some now  and that this started on Christmas day. The pain is not reproducible by  either deep inspiration, movement of the upper arms or palpation.   The patient states that it can be precipitated by activity or exertion  (i.e. walking or working in the kitchen), but again it can linger for  several hours in duration. He has never taken nitroglycerin for this.   The patient also reports having had recent MRI of the spine suggestive  of degenerative joint disease. He has not yet had formal followup with  Dr. Zola Button for review of these results.   CURRENT MEDICATION REGIMEN:  Unchanged since  previous office visit.   PHYSICAL EXAMINATION:  VITAL SIGNS:  Blood pressure 138/60, pulse 68  regular, weight 309.  GENERAL:  A 70 year old male sitting upright in no distress.  NECK:  Palpable bilateral carotid pulses without bruits.  LUNGS:  Clear to auscultation all fields.  HEART:  Regular rate and rhythm (S1, S2). No significant murmurs.  ABDOMEN:  Protuberant, nontender with intact bowel sounds.  EXTREMITIES:  No significant pedal edema.  NEUROLOGICAL:  No focal deficits.   IMPRESSION:  1. Atypical chest pain/known coronary artery disease.      a.     Status post non ST elevation myocardial infarction/two       vessel CABG April 2004; LIMA - LAD; SVG - diagonal artery.      b.     Preserved left ventricular function.      c.     Status post prior stenting of the mid left anterior       descending artery.  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  3. Hyperlipidemia.      a.  Followup by Dr. Zola Button.  4. Obesity.   PLAN:  Results of the recent stress test were reviewed by Dr. Diona Browner  who noted that patient has known occlusion of the nondominant right  coronary artery and, therefore, correlates with the findings on recent  perfusion imaging. Of note there is no evidence of ischemia in the  anterior distribution and therefore, recommendation is to continue  medical therapy. We will continue patient on current medication regimen  and have him return to the clinic for continued followup with Dr.  Diona Browner in approximately six months.   Of note if patient requires surgical intervention for his arthritis of  the spine, then he is cleared to proceed with surgery and would be  considered at low risk from a cardiology standpoint for perioperative  events.      Rozell Searing, PA-C  Electronically Signed      Jonelle Sidle, MD  Electronically Signed   GS/MedQ  DD: 11/09/2006  DT: 11/09/2006  Job #: [id number]   cc:   Hadassah Pais, M.D.

## 2011-04-01 NOTE — Discharge Summary (Signed)
NAME:  Clifford King, GRAVELY NO.:  [id number]   MEDICAL RECORD NO.:  [id number]                   PATIENT TYPE:  INP   LOCATION:  2019                                 FACILITY:  [facility]   PHYSICIAN:  Gwenith Daily. Tyrone Sage, M.D.            DATE OF BIRTH:  [birth year]   DATE OF ADMISSION:  02/14/2003  DATE OF DISCHARGE:  02/24/2003                                 DISCHARGE SUMMARY   CARDIOLOGIST:  Dr. Jonelle Sidle, [facility], [place], [place].   DISCHARGE DIAGNOSES:  1. Coronary artery disease with restenosis following stent placement and     brachytherapy of the left anterior descending/diagonal system, preserved     left ventricular function.  2. Biliary colic preoperatively -- obstructing stone -- relieved     spontaneously.  3. Postoperative atrial fibrillation/flutter, postoperative day #3,     converted to sinus rhythm with direct current cardioversion,     postoperative day #4, currently on amiodarone and Lopressor.  4. 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   SECONDARY DIAGNOSES:  1. Known atherosclerotic coronary artery disease, status post non-Q-wave     myocardial infarction with stenting of the left anterior descending,     March of 2003, with restenosis and status post cutting     balloon/brachytherapy, April 23, 2002.  2. Hypertension.  3. Hyperlipidemia.  4. History of tobacco use.  5. Gouty arthritis.   PROCEDURES:  1. February 14, 2003, left heart catheterization, left ventriculography and     coronary angiography, Dr. Diona Browner.  This study showed that the patient     had a greater than 80% in-stent stenosis of the proximal left anterior     descending involving the diagonal.  He has a very small right coronary     artery lesion with stenoses, but the distal vessel was very small.  2. February 14, 2003, carotid Duplex  ultrasound:  No hemodynamically significant     internal carotid artery stenosis on the right.  On the left was a 40-60%     internal carotid artery stenosis.  3. February 17, 2003, coronary artery bypass graft surgery x2, off pump, Dr.     Ramon Dredge B. Tyrone Sage, Careers adviser.  In this procedure, the left internal     mammary artery was connected in an end-to-side fashion to the left     anterior descending coronary artery and a reversed saphenous vein graft     was fashioned from the aorta to the diagonal.   DISCHARGE DISPOSITION:  The patient is ready for discharge, postoperative  day #7, after undergoing coronary artery bypass graft surgery x2 as an off-  pump procedure.  Preoperatively, he had right upper quadrant pain in  addition to the accelerating anginal symptoms which brought him to medical  attention this admission.  He underwent a computed tomogram of the  abdomen  which showed several large calcified gallstones within the gallbladder.  He  was seen by a general surgery consult and at that time, was having no  further right upper quadrant pain, with spontaneous relief.  He remains at  risk for further obstruction and symptoms.  He also experienced atrial  fibrillation and flutter, postoperative day #3, and was started on  amiodarone and also underwent D-C cardioversion with successful conversion  to a sinus rhythm and he has remained in sinus rhythm, postoperative day #4  to postoperative day #7.  His mental status is clear in the postoperative  period.  He is ambulating independently.  His incision is healing nicely.  His pain is very well-controlled; he is taking only Tylenol for this.  He  has not required any prolonged oxygen support and was on room air  oxygenation by postoperative day #1.   DISCHARGE MEDICATIONS:  He will go home with the following medications:  1. Tylenol 325 mg one to two tabs p.o. q.4-6h. p.r.n. pain.  2. Toprol-XL 25 mg daily.  3. Accupril 10 mg daily.  4.  Allopurinol 100 mg daily.  5. Enteric-coated aspirin 325 mg daily.  6. Plavix 75 mg daily.  7. Lipitor 10 mg daily, preferably at bedtime.  8. Amiodarone 200 mg one tab in the morning and one tab in the evening.   DISCHARGE ACTIVITY:  His discharge activity is to walk daily to build up his  strength.  He is asked not to lift more than 10 pounds for the next 6 weeks.  He is not to drive for the next two weeks.   DISCHARGE DIET:  Low-sodium, low-cholesterol diet.   WOUND CARE:  He may shower.  He is to call Dr. Dennie Maizes office if his  incision becomes inflamed or starts to drain.   FOLLOWUP:  He has an office visit with Dr. Diona Browner at [facility],  [place], [place].  He is to call [phone] to make an appointment for two  weeks after discharge.  Chest x-ray will be taken.  He has an office visit  with Dr. Tyrone Sage in three weeks; Dr. Dennie Maizes office will call with that  appointment.   BRIEF HISTORY:  The patient is a 70 year old male.  He has a known history  of coronary artery disease.  He did experience a non-Q-wave myocardial  infarction in March of 2003.  He underwent stenting of the proximal LAD  lesion.  Three months later he had repeat chest discomfort and was found to  have in-stent restenosis.  He underwent cutting balloon as well as  adjunctive brachytherapy.  He has done well since that time, however, he  reports to the office of [facility] in [place] with a one-week history  of substernal chest pressure.  This usually occurs with exertion.  Relief is  obtained after 5 to 10 minutes.  He has not tried nitroglycerin to relieve  symptoms.  He has had no pain at rest.  After being seen by Dr. Willa Rough  of [facility], is admitted for left heart catheterization.   HOSPITAL COURSE:  The patient was admitted to [facility], February 14, 2003.  He underwent left heart catheterization the same day with the findings as dictated above, an 80%  proximal LAD stenosis as well as in-stent  stenosis which occurred past the first diagonal.  He was evaluated by Dr.  Sheliah Plane of the [facility], who  recommended revascularization surgery  using off-pump technique.  Of note,  his ejection fraction is estimated at 65%.  While awaiting surgery, he  experienced right upper quadrant pain.  A computed tomogram of the abdomen,  which has mentioned previously, was obtained, showing large gallstones in  the gallbladder with no obstruction at the time of the CT scan.  His  symptoms have subsequently subsided and it was decided that he would undergo  surgery prior to any abdominal procedure.  The surgery was done on February 17, 2003.  Two bypasses were placed as an off-pump procedure and his hospital  course has taken seven days, with the finding  of atrial fibrillation and flutter on postoperative day #3, which readily  converted with DC CV.  He is on amiodarone and goes home with amiodarone and  Lopressor.  As mentioned above, his mental status is clear, his incisions  are healing well, he is ambulating independently.     Maple Mirza, P.A.                    Gwenith Daily Tyrone Sage, M.D.    GM/MEDQ  D:  02/23/2003  T:  02/24/2003  Job:  [id number]   cc:   Jonelle Sidle, M.D. [facility]   [place], [place], [place].D.

## 2011-04-01 NOTE — Discharge Summary (Signed)
   NAME:  NASIM, HABEEB NO.:  [id number]   MEDICAL RECORD NO.:  [id number]                   PATIENT TYPE:  INP   LOCATION:  2019                                 FACILITY:  [facility]   PHYSICIAN:  Gwenith Daily. Tyrone Sage, M.D.            DATE OF BIRTH:  11-22-1940   DATE OF ADMISSION:  02/14/2003  DATE OF DISCHARGE:                                 DISCHARGE SUMMARY   ADDENDUM:  These medications should be added to his discharge medication list:  In  addition to the medications already dictated, he will get:  1. Norvasc 5 mg daily.  2. Lasix 40 mg daily.  3. Potassium chloride 40 mEq daily.     Maple Mirza, P.A.                    Gwenith Daily Tyrone Sage, M.D.    GM/MEDQ  D:  02/23/2003  T:  02/24/2003  Job:  [id number]

## 2011-04-01 NOTE — Discharge Summary (Signed)
[place]  Patient:    ENNIS, HEAVNER Visit Number: [id number] MRN: [id number]          Service Type: MED Location: [phone] Attending Physician:  Learta Codding Dictated by:   Brita Romp, P.A.C. Admit Date:  01/29/2002 Discharge Date: 01/31/2002   CC:         Dr. Ivory Broad, [place]  Jonelle Sidle, M.D. [facility], [place], [place]   Discharge Summary  DISCHARGE DIAGNOSES: 1. Non-Q-wave myocardial infarction. 2. Coronary artery disease, status post cardiac catheterization with    percutaneous coronary intervention. 3. Hypertension. 4. Hyperlipidemia. 5. Gout.  HOSPITAL COURSE:  Mr. Rogus is a pleasant 70 year old male who was initially seen on January 29, 2002, at [facility] by Jonelle Sidle, M.D.  Dr. Diona Browner felt that the patients symptoms were consistent with unstable angina and acute coronary syndrome. He also arranged transport to [facility] for cardiac catheterization.  On January 29, 2002, the patient was transferred to [facility] and underwent bilateral carotid Dopplers. This was ordered because of the auscultation of carotid bruits on exam.  There was no internal carotid artery stenosis on the right.  On the left side, there was a 40 to 60% ICA stenosis on the low end of the scale.  The right vertebral artery showed a slow irregular flow pattern and the left vertebral artery showed flow antegrade.  On January 30, 2002, the patient was taken to the cath lab by Veneda Melter, M.D. Catheterization results: 1. Left main coronary artery:  Mild disease. 2. Left anterior descending coronary artery:  An 80% lesion after the first    diagonal; 70% lesion in the mid first diagonal. 3. Left circumflex:  Dominant; two obtuse marginals; 50% lesion in the    PDA. 4. Right coronary artery:  Nondominant; 99% lesion in the mid vessel.  Dr. Chales Abrahams then preceded to perform  angioplasty and stenting to the lesion in the left anterior descending coronary artery.  The 80% lesion was reduced down to 0% with resulting TIMI-3 flow. Dr. Chales Abrahams recommended Plavix therapy for approximately one month.  In the afternoon, the patient was seen in routine post cath check. Groin was still oozing and he was kept on bedrest an additional six hours.  The next day, the patient was seen by Dr. Chales Abrahams. The patient was doing well and his groin was stable.  He was felt to be ready for discharge.  DISCHARGE MEDICATIONS: 1. Allopurinol 100 mg q.d. 2. Enteric coated aspirin 325 mg q.d. 3. Actopril 20 mg q.d. 4. Lopressor 25 mg (one half 50 mg tablet) b.i.d. 5. Plavix 75 mg q.d. 6. Nitroglycerin as needed. 7. Lipitor 10 mg q.h.s.  LAB VALUES: White count 5.1, hemoglobin 14.0, hematocrit 39.4, platelets 160. Sodium 142, potassium 3.9, chloride 108, CO2 26, BUN 7, creatinine 0.9, glucose 122. Total cholesterol 141, triglycerides 154, HDL 32, LDL 78, total cholesterol to HDL ratio 4.4.  Electrocardiogram shows sinus bradycardia at 58, PR interval 176, QRS 112, QTC 429, axis 4.  There were also signs of an old inferior infarction.  DISCHARGE INSTRUCTIONS: 1. The patient was to avoid driving, heavy lifting or tub baths for two days. 2. He is to follow a low salt, low fat, low cholesterol diet. 3. He is to watch the cath site for any pain, bleeding or swelling and to    call the [place] office if any of these problems. 4.  He is to follow up with Dr. Zola Button as needed or scheduled. 5. He is to follow up with Dr. Diona Browner at the [facility] on February 21, 2002,    at 10:45 in the morning. Dictated by:   Brita Romp, P.A.C. Attending Physician:  Learta Codding DD:  01/31/02 TD:  02/01/02 Job: [id number]

## 2011-04-01 NOTE — Cardiovascular Report (Signed)
[place]  Patient:    Clifford King, Clifford King Visit Number: [id number] MRN: [id number]          Service Type: MED Location: [phone] Attending Physician:  Learta Codding Dictated by:   Veneda Melter, M.D. Proc. Date: 01/30/02 Admit Date:  01/29/2002 Discharge Date: 01/31/2002   CC:         Jonelle Sidle, M.D. [facility]  Quitman Livings, M.D.   Cardiac Catheterization  PROCEDURES PERFORMED: 1. Left heart catheterization. 2. Left ventriculogram. 3. Selective coronary angiography. 4. Percutaneous stent placed in the mid left anterior descending. 5. Perclose, right femoral artery.  DIAGNOSES: 1. Severe single-vessel coronary artery disease. 2. Normal left ventricular systolic function. 3. Status post non-Q-wave myocardial infarction.  HISTORY: The patient is a 70 year old white male with multiple cardiovascular risk factors, who presents with substernal chest discomfort. The patient recently underwent stress test showing ECG changes in the inferolateral leads. He also had mild elevation of cardiac enzymes consistent with non-Q-wave myocardial infarction. He was admitted to the hospital and has been stabilized medically and he presents for further cardiac assessment.  TECHNIQUE: After informed consent was obtained, the patient was brought to the cardiac catheterization lab where a 6 French sheath was placed in the right femoral artery using the modified Seldinger technique. The 6 [place] and JR4 catheters were then used to engage the left and right coronary arteries and selective angiography performed in various projections using manual injections of contrast. A 6 French pigtail catheter was then advanced to the left ventricle and left ventriculogram performed using power injections of contrast.  FINDINGS: Initial findings are as follows: 1. Left main trunk: The left main trunk is a large caliber vessel with mild  irregularities. 2. LAD: This is a large caliber vessel that provides a major first diagonal    branch in the proximal segment, as well as a large first septal perforator.    The LAD then extends to the apex and there is a high-grade tubular    narrowing in the mid LAD immediately after the diagonal branch extending    up to and encompassing the first septal perforator of at least 80%. The    distal LAD has mild diffuse disease of 30-40%. The first diagonal    branch bifurcates in its distal segment. It has a high-grade narrowing of    70% in its mid section. 3. The left circumflex artery is dominant: This is a large caliber vessel    that provides two medium caliber marginal branches in the mid section,    two small marginal branches distally as well as the posterior descending    artery. The AV circumflex has moderate diffuse disease of 30%. The first    marginal branch has a moderate narrowing of 50% in its inferior division.    The remainder of the left circumflex system has mild diffuse    irregularities. 4. Right coronary artery is nondominant. This is a small caliber vessel that    provides several RV marginal branches. The right coronary artery has a    subtotal narrowing of 99% in the mid section.  LEFT VENTRICULOGRAM: Normal end-systolic and end-diastolic dimensions. Overall left ventricular function is well preserved, ejection fraction of greater than 55%.  No mitral regurgitation. LV pressure 160/10, aortic is 160/80. LVEDP equals 18.  These findings were reviewed with the patient and we elected to proceed with percutaneous intervention to the LAD. The 6  French sheath was exchanged for a 7 [place] sheath, and a Voda left 4 guide catheter used to engage the left coronary artery. The patient was given heparin and Integrilin on a weight-adjusted basis to maintain ACT of greater than 240 seconds. He was also given 300 mg of Plavix at the end of the case. A 0.014 inch extra-support  wire was advanced in the distal LAD, and a 3.0 x 18 mm BiodivYsio stent introduced. This was carefully positioned in the mid LAD from the first diagonal branch extending through the lesion and deployed at 14 atmospheres for 30 seconds. Repeat angiography after the administration of intracoronary nitroglycerin showed an excellent result with no residual stenosis and full coverage of the lesion. The septal perforator had slightly increased compromise from perhaps 30 to 70%. However, there was TIMI-3 flow through the LAD and septal perforator. There was no evidence of distal vessel damage which was deemed an acceptable result. The guide catheter was then removed. The sheath was removed and a Perclose suture closure device deployed to the right femoral artery. Adequate hemostasis was achieved. The patient tolerated the procedure well and was transferred to the ward in stable condition.  FINAL RESULTS: Successful primary stent placement in the mid left anterior descending with reduction of tubular 80% narrowing to 0% using a 3.0 x 18 mm BiodivYsio stent. Dictated by:   Veneda Melter, M.D. Attending Physician:  Learta Codding DD:  01/30/02 TD:  01/31/02 Job: [id number]

## 2011-04-01 NOTE — Discharge Summary (Signed)
[place]. [facility]  Patient:    Clifford King, Clifford King Visit Number: [id number] MRN: [id number]          Service Type: MED Location: [phone] Attending Physician:  Ronaldo Miyamoto Dictated by:   Brita Romp, P.A. Admit Date:  04/18/2002 Discharge Date: 04/24/2002   CC:         Erasmo Downer, M.D.  Dr. Annell Greening, [place]  Jonelle Sidle, M.D. [facility]   Discharge Summary  DISCHARGE DIAGNOSES: 1. Coronary disease, status post cardiac cath with brachy therapy. 2. Hypertension. 3. Hyperlipidemia. 4. Past history of tobacco abuse. 5. Atherosclerotic peripheral vascular disease.  HOSPITAL COURSE: Clifford King is a 70 year old male who had previously undergone cardiac cath in March of 2003. He presented to the [facility] emergency room on April 17, 2002 with a two week history of intermittent substernal chest pain. He was seen in consult by Dr. Lewayne Bunting. Dr. Andee Lineman felt that given his history, he should be transferred to [facility] for cardiac catheterization.  HOSPITAL COURSE: On April 19, 2002, the patient was taken to the cath lab by Dr. Bonnee Quin.  CATHETERIZATION RESULTS: 1. Left ventriculogram: preserved overall systolic function with no segmental    wall motion abnormalities. 2. Subclavian is noted to be widely patent. 3. Left anterior descending: segmental stenosis of 95% within the previously    placed stent, lying across the origin of the septal perforator. There is    also enlarged diagonal branch with 60-70% segmental plaquing in the    proximal portion, followed by a focal 70-75% in the mid vessel. 4. Circumflex: Dominant 20-30% in the proximal portion of the first marginal    branch. 5. Right coronary artery: Nondominant subtotal occlusion of the mid to distal    vessel with late filling distally.  HOSPITAL COURSE CONTINUED: Dr. Riley Kill felt that the patient had aggressive restenosis and the options included cutting  balloon angioplasty plus/minus the adjunctive brachy therapy. At the time of the procedure, he was leaning toward brachy therapy. He planned to set this up for the following Tuesday. Over the next few days, the patient remained stable, complaining of some right lateral chest wall pain which increased with palpation or movement. Otherwise, he had no anginal type of pain. On April 23, 2002, the patient was taken back to the cath lab by Dr. Juanda Chance. He used cutting balloon as well as adjunctive brachy therapy to reduce the stenosis from 90 down to 10%. He also noted that the patient would need to be on Plavix for six months post procedure. The next day, the patient was doing well. Had no complaints. He was felt to be ready for discharge.  DISCHARGE MEDICATIONS: 1. Allopurinol 100 mg q.d. 2. Enteric coated aspirin 325 mg q.d. 3. Toprol XL 25 mg q.d. 4. Plavix 75 mg q.d. for six months. 5. Accupril 20 mg q.d. 6. Colchicine 0.6 mg b.i.d. 7. Norvasc 5 mg q.d. 8. Nitroglycerin SL as needed.  LABORATORY DATA: Sodium 138, potassium 3.9, chloride 106, CO2 24, BUN 13, creatinine 0.8, glucose 106, white count 4.4, H&H 13.3 and 38.1. Platelets 131.  DIAGNOSTIC STUDIES: Chest x-ray on April 21, 2002 revealed cardiomegaly with some mild chronic peribronchial thickening. However, there was no acute disease. EKG showed marked sinus bradycardia with a rate of 45 and occasional PVCs. PR interval 182, QRS 106, QTC 436, axis 34. Dictated by:   Brita Romp, P.A. Attending Physician:  Ronaldo Miyamoto DD:  04/24/02 TD:  04/25/02 Job: [id number]

## 2011-04-01 NOTE — Cardiovascular Report (Signed)
[place]. [facility]  Patient:    Clifford King, Clifford King Visit Number: [id number] MRN: [id number]          Service Type: MED Location: [phone] Attending Physician:  Ronaldo Miyamoto Dictated by:   Arturo Morton Riley Kill, M.D. [facility] Proc. Date: 04/19/02 Admit Date:  04/18/2002   CC:         Veneda Melter, M.D.  Jonelle Sidle, M.D. [facility]  Erasmo Downer, M.D.  Dr. Annell Greening, [place]   Cardiac Catheterization  INDICATIONS: The patient is a 70 year old who presents with recurrent substernal chest pain. He underwent percutaneous intervention back in March of this past year. Because of the recurrent symptoms he was brought back to the catheterization lab for further evaluation.  PROCEDURES: 1. Left heart catheterization. 2. Selective coronary arteriography. 3. Selective left ventriculography.  DESCRIPTION OF PROCEDURE: The procedure was performed from the right femoral artery using 6 French catheters. He tolerated the procedure without complication. He was taken to the holding area in satisfactory clinical condition.  HEMODYNAMICS: 1. Central aorta 185/72. 2. Left ventricular 170/16. 3. No gradient on pullback across the aortic valve.  ANGIOGRAPHIC DATA: 1. Ventriculography was done in the RAO projection. Overall systolic    function was preserved and no segmental abnormalities contraction    were identified. 2. The subclavian vessel appeared to be widely patent. 3. The left anterior descending artery demonstrates a segmental area of    disease with its worst point about 95% of the previously point. This    demonstrates in-stent re-stenosis lying across the origin of the septal    perforator. The remainder of the vessel is without critical disease and is    about a 3 mm in size. There is a large diagonal branch that has about    50-60% segmental plaquing throughout its proximal portion, and then has a    70-75% mid stenosis that is  fairly focal. 4. The circumflex is a dominant vessel and provides a first marginal branch    that has about 20-30% narrowing proximal. The AV circumflex is without    critical disease other than minor luminal irregularity. 5. The right coronary artery demonstrated a nondominant vessel. There appears    to be a subtotal occlusion in the mid to distal vessel with late filling    in of the vessel distally.  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  PLAN: The patient has aggressive re-stenosis. It is fairly early and somewhat segmental. Options include Cutting Balloon angioplasty versus Cutting Balloon angioplasty with adjunctive brachytherapy. Given the aggressiveness of the process, my leaning is in the direction of brachytherapy adjunct to Cutting Balloon angioplasty to try to seal the long-term result. We will make arrangements for this on Tuesday. Dictated by:   Arturo Morton Riley Kill, M.D. [facility] Attending Physician:  Ronaldo Miyamoto DD:  04/19/02 TD:  04/22/02 Job: [id number]

## 2011-04-01 NOTE — Consult Note (Signed)
NAME:  Clifford King, Clifford King NO.:  [id number]   MEDICAL RECORD NO.:  [id number]                   PATIENT TYPE:  INP   LOCATION:  4731                                 FACILITY:  [facility]   PHYSICIAN:  Jimmye Norman III, M.D.               DATE OF BIRTH:  1941/03/12   DATE OF CONSULTATION:  02/16/2003  DATE OF DISCHARGE:                                   CONSULTATION   REASON FOR CONSULTATION:  Dr. Ramon Dredge B. Tyrone Sage, thank you very much for  asking me to see this patient, a 70 year old gentleman who is scheduled for  a coronary artery bypass graft surgery after he had recurred with angina,  status post stent placement back in March.   Over the last 48 hours the patient has been complaining also of right-sided  chest and abdominal pain.  A CT scan was done to assess this problem.  This  demonstrated gallstones in the right upper quadrant, but no evidence of  acute cholecystitis.  With the question of this being a problem, a surgical  consultation was obtained.   PAST MEDICAL HISTORY:  1. Significant for hypertension.  2. Hypercholesterolemia.  3. Coronary artery disease.  4. He is status post stent grafts back in March 2003.  5. He also has gouty arthritis.  6. Hyperlipidemia.   IN-HOSPITAL MEDICATIONS:  1. Dulcolax.  2. __________  3. K-Dur.  4. Norvasc.  5. Toprol XL.  6. __________  7. Restoril.   MEDICATIONS PRIOR TO ADMISSION:  1. Plavix.  2. Accupril.  3. Baby aspirin.   ALLERGIES:  By report he has no known drug allergies.   REVIEW OF SYSTEMS:  He has had no jaundice, no fevers or chills, no darkened  urine or light stools.  He has had no evidence of upper or mid-back pain.   PHYSICAL EXAMINATION:  GENERAL:  He is a well-nourished, overweight  gentleman, in no acute distress.  CHEST:  He has some discomfort in the  right side.  It is mostly in the right submammary chest wall area.  We can  palpate at about the fifth or sixth rib in the  midclavicular line that the  patient has a mild tenderness there.  ABDOMEN:  Right upper quadrant tenderness at the costal margin, and no  palpable masses.  He has excellent bowel sounds, and no palpable lesion.  The liver and spleen cannot be palpated.   IMPRESSION/PLAN:  He has possibly intermittently some kind of gallstones,  but a HIDA scan will be very helpful to rule out acute disease.  If the HIDA  scan is positive, and shows no impatency of the cystic duct, concerning the  patient's current symptoms, which are minimal, including no fever and normal  white count.  I would still say that surgery is hard to proceed in the event  of his gallbladder; however, I would like to go ahead and do a  cholecystectomy prior to discharge, with the patient off of anticoagulants.  If the HIDA scan is normal, then an elective cholecystectomy, done well  after discharge, should be likely done considering that this may have  represented intermittent episodes of biliary colic.                                                Kathrin Ruddy, M.D.    JW/MEDQ  D:  02/16/2003  T:  02/17/2003  Job:  [id number]   cc:   Willa Rough, M.D. [facility]   Gwenith Daily. Tyrone Sage, M.D.  [address]  [place]  [place] [address]  Fax: [phone]

## 2011-04-01 NOTE — Cardiovascular Report (Signed)
[place]. [facility]  Patient:    Clifford King, Clifford King Visit Number: [id number] MRN: [id number]          Service Type: MED Location: [phone] Attending Physician:  Ronaldo Miyamoto Dictated by:   Everardo Beals Juanda Chance, M.D. [facility] Proc. Date: 04/23/02 Admit Date:  04/18/2002 Discharge Date: 04/24/2002   CC:         [facility]  Erasmo Downer, M.D.  Dr. Herbie Drape, [place], [place]   Cardiac Catheterization  PROCEDURES PERFORMED: Percutaneous coronary intervention.  CLINICAL HISTORY: The patient is 70 years old and had stenting of the LAD in March of 2002. He recently was admitted with recurrent chest pain and was studied at the end of last week by Dr. Riley Kill and found to have a tight in-stent re-stenosis in the LAD. He was scheduled for intervention with brachytherapy today.  DESCRIPTION OF PROCEDURE: The procedure was performed via the left femoral artery using an arterial sheath and 6 French preformed coronary catheters.  A front wall arterial puncture was performed and Omnipaque contrast was used. The patient was given Angiomax bolus in infusion. We used a 7 [place] guiding catheter with side holes and a short luge wire. We crossed the lesion in the proximal LAD with the wire without difficulty. We used a 3.0 x 15 mm Cutting Balloon and performed a total of seven inflations up to 10 atmospheres for 52 seconds within the stent and just proximal and distal to the stent. We then removed the Cutting Balloon and placed a Galileo 3.0 x 32 mm centering balloon. We deployed this to 4 atmospheres and delivered 20 Gy with the active wire. This was delivered for 6 hours and 22 seconds. We then removed the centering balloon and went back and touched up the lesion with the 3.0 x 15 mm Cutting Balloon performing two inflations of 9 atmospheres for 35 and 41 seconds. Repeat diagnostic studies were then performed through the guiding catheter. The  patient tolerated the procedure well and left the laboratory in satisfactory condition.  RESULTS: Initially the stenosis in the proximal LAD was estimated at 90-95%. Following Cutting Balloon angioplasty this improved to 10%.  CONCLUSIONS: Successful Cutting Balloon angioplasty with brachytherapy for in-stent re-stenosis in the proximal LAD with improvement in percent diameter narrowing from 90% to less than 10%.  DISPOSITION: The patient was returned to the postangioplasty unit for further observation. Dictated by:   Everardo Beals Juanda Chance, M.D. [facility] Attending Physician:  Ronaldo Miyamoto DD:  04/23/02 TD:  04/25/02 Job: [id number]

## 2011-04-01 NOTE — H&P (Signed)
NAME:  Clifford King, Clifford King NO.:  [id number]   MEDICAL RECORD NO.:  [id number]                   PATIENT TYPE:  OIB   LOCATION:  2899                                 FACILITY:  [facility]   PHYSICIAN:  Willa Rough, M.D. [facility]              DATE OF BIRTH:  18-Feb-1941   DATE OF ADMISSION:  02/14/2003  DATE OF DISCHARGE:                                HISTORY & PHYSICAL   CHIEF COMPLAINT:  Chest discomfort.   HISTORY OF PRESENT ILLNESS:  A 70 year old white male with history of  coronary artery disease.  The patient suffered a non-Q-wave MI in March  2003, and underwent catheterization and subsequent stenting to a proximal  LAD lesion.  He had repeat chest discomfort three months later and in April 23, 2002, had a 95% segmental stenosis of a previously placed stent and  underwent cutting balloon as well as adjunctive brachytherapy to this area  which reduced it from 90% to 10%.  He did well and was last seen in followup  October 22, 2002, by Dr. Diona Browner.   He is an add-on to the office today.  He complains of a one-week history of  left and substernal chest pressure described as a pushing from inside to  out.  This occurs with walking or with light exertion like taking out the  garbage.  Relief is obtained with rest after 5-10 minutes.  He has not tried  nitroglycerin.  He has had no rest symptoms, prolonged symptoms, or  nocturnal symptoms.  I discussed this with Dr. Myrtis Ser today and he feels the  most expeditious route would be to readmit him for heart catheterization.   REVIEW OF SYSTEMS:  Positive for chest pain as above.  He has intermittent  chronic lower extremity edema, chronic shortness of breath, and dyspnea on  exertion.  He has complaints consistent with claudication.  He had a non-Q  MI.  He has never had heart failure, orthopnea, PND, palpitations, syncope,  presyncope, or TIA.   CARDIAC RISKS:  Positive for hypertension x11 years, hyperlipidemia  x11  years, previous tobacco abuse.  He denies any history of diabetes or family  history of premature coronary artery disease.  He is overweight.   ALLERGIES:  No known drug allergies.   CURRENT MEDICATIONS:  1. Allopurinol 100 mg daily.  2. Accupril 10 mg daily.  3. Plavix 75 mg daily.  4. Enteric coated aspirin 81 mg daily.  5. Toprol XL 25 mg daily.  6. Norvasc 5 mg daily.  7. Lipitor 10 mg daily.   FURTHER REVIEW OF SYSTEMS:  The review of systems is good other than what is  mentioned above.  He does have some chronic back pain for which he takes  p.r.n. Percocet, and gouty arthritis.  Otherwise, no other organ or system  complaints.   PAST MEDICAL HISTORY:  1. Coronary artery disease as outlined above, status post non-Q-wave  MI with     stenting to the LAD in March 2003, with restenosis and status post     cutting balloon and adjunctive brachytherapy on April 23, 2002.  2. Hypertension.  3. Hyperlipidemia.  4. Past tobacco abuse with chronic peribronchial thickening on chest x-ray.  5. Atherosclerotic cardiovascular disease, status post varicose vein     stripping and ligation with bilateral carotid bruits on physical exam.  6. Gouty arthritis and chronic back pain.   SOCIAL HISTORY:  He is married.  He has no children of his own.  His stepson  died with an MI at age 30. He is status post varicose vein stripping and  ligation and has been on disability for blood clot to his lower extremity.  He is retired from [employer]  He quit tobacco many years ago.  He does not drink alcohol.   FAMILY HISTORY:  Mother died with CVA at age 68.  Father died with cancer  many years ago.   PHYSICAL EXAMINATION:  VITAL SIGNS:  Blood pressure is 144/64, pulse is 54  and regular.  GENERAL:  A large white male.  NECK:  Faint bilateral carotid bruits, left greater than right.  There was  no JVD and __________ thyromegaly.  LUNGS:  Essentially clear to A&P.  HEART:  Rhythm  is regular.  Normal S1, S2 without murmurs, rubs, or clicks,  S3 or S4 gallop.  ABDOMEN:  Soft, obese.  Bowel sounds are present.  EXTREMITIES:  Changes of chronic venous insufficiency.  Pulses are 1+.  There was no significant edema.   IMPRESSION:  1. Recurrent chest discomfort compatible with angina pectoris with exertion.     He has had no rest, nocturnal, or prolonged symptoms.  2. History of coronary artery disease, status post non-Q-wave myocardial     infarction with percutaneous transluminal coronary angioplasty to the     left anterior descending in March 2003, with restenosis and status post     cutting balloon and brachytherapy to the left anterior descending April 23, 2002.  3. Hypertension x11 years.  4. Hyperlipidemia x11 years.  5. Past tobacco abuse with chronic peribronchial thickening on chest x-ray.  6. Atherosclerotic cardiovascular disease status post varicose vein     stripping and ligation with bilateral carotid bruits on physical     examination.  7. Gouty arthritis, degenerative joint disease, and chronic back pain.   PLAN:  The patient will be admitted electively for heart catheterization.  He agrees, understands, and accepts the risks, benefits, and explained  procedures.      Suszanne Conners. Julious Oka.                    Willa Rough, M.D. [facility]    MRD/MEDQ  D:  02/13/2003  T:  02/14/2003  Job:  [id number]   cc:   Dr. Ivory Broad, [place]

## 2011-04-04 DIAGNOSIS — I4891 Unspecified atrial fibrillation: Secondary | ICD-10-CM

## 2011-04-08 ENCOUNTER — Ambulatory Visit (INDEPENDENT_AMBULATORY_CARE_PROVIDER_SITE_OTHER): Payer: 59 | Admitting: *Deleted

## 2011-04-08 DIAGNOSIS — I4891 Unspecified atrial fibrillation: Secondary | ICD-10-CM

## 2011-04-08 LAB — POCT INR: INR: 3.1

## 2011-04-15 ENCOUNTER — Ambulatory Visit (INDEPENDENT_AMBULATORY_CARE_PROVIDER_SITE_OTHER): Payer: 59 | Admitting: *Deleted

## 2011-04-15 DIAGNOSIS — I4891 Unspecified atrial fibrillation: Secondary | ICD-10-CM

## 2011-04-20 ENCOUNTER — Encounter: Payer: Self-pay | Admitting: Cardiology

## 2011-04-20 ENCOUNTER — Ambulatory Visit (INDEPENDENT_AMBULATORY_CARE_PROVIDER_SITE_OTHER): Payer: 59 | Admitting: Physician Assistant

## 2011-04-20 ENCOUNTER — Ambulatory Visit (INDEPENDENT_AMBULATORY_CARE_PROVIDER_SITE_OTHER): Payer: 59 | Admitting: *Deleted

## 2011-04-20 DIAGNOSIS — I2510 Atherosclerotic heart disease of native coronary artery without angina pectoris: Secondary | ICD-10-CM

## 2011-04-20 DIAGNOSIS — R0602 Shortness of breath: Secondary | ICD-10-CM

## 2011-04-20 DIAGNOSIS — I4891 Unspecified atrial fibrillation: Secondary | ICD-10-CM

## 2011-04-20 DIAGNOSIS — R0609 Other forms of dyspnea: Secondary | ICD-10-CM

## 2011-04-20 NOTE — Assessment & Plan Note (Signed)
Patient is S/P failed DCCV (x2), 5/24, With adequate rate control on beta blocker. He has remained on Coumadin, and had an INR of 2.9 today. We discussed whether or not to remain on Coumadin indefinitely, versus reverting back to aspirin. He has a CHADS score of 1 (CHADSVASC: 2), secondary to HTN. His progress would be to not continue on Coumadin. He is having difficulty with easy bleeding, and is currently on concomitant low-dose aspirin. We also discussed initiating antiarrhythmic therapy (i.e. Amiodarone). He will await making a decision, until after I confer with Dr. Diona Browner later this week. Will continue current medication regimen, in the meanwhile.

## 2011-04-20 NOTE — Assessment & Plan Note (Signed)
No significant change from baseline exercise tolerance, since his last office visit. Does not present with symptoms suggestive of heart failure.

## 2011-04-20 NOTE — Patient Instructions (Signed)
Follow up as scheduled. We will call Friday with further instructions.

## 2011-04-20 NOTE — Assessment & Plan Note (Signed)
Stable on current medication regimen, with no signs/symptoms of unstable angina.

## 2011-04-20 NOTE — Progress Notes (Signed)
HPI: Patient presents status post failed DC cardioversion, performed May 21 by Dr Diona Browner. Patient underwent 2 attempts at cardioversion, with successive shocks of 150 and 200 J, each time resulting in persistent atrial fibrillation. He presented with an INR of 2.6.  Clinically, he denies tachycardia palpitations or chest pain, but continues to report easy fatigability and mild DOE. He denies any symptoms suggestive of heart failure.  Regarding long-term strategies, he would prefer to stop taking Coumadin, and referred back to aspirin. He was previously on concomitant aspirin/Plavix. He is concerned about easy bleeding. With respect to additional medication options, he has never been treated with amiodarone. He also inquired about the possibility of other RF ablation, as a reasonable treatment option.   No Known Allergies  Current Outpatient Prescriptions on File Prior to Visit  Medication Sig Dispense Refill  . allopurinol (ZYLOPRIM) 100 MG tablet Take 100 mg by mouth daily.        Marland Kitchen amLODipine (NORVASC) 10 MG tablet Take 10 mg by mouth daily.        Marland Kitchen aspirin 81 MG tablet Take 81 mg by mouth daily.        Marland Kitchen lisinopril (PRINIVIL,ZESTRIL) 20 MG tablet Take 20 mg by mouth daily.        . metoprolol (TOPROL-XL) 50 MG 24 hr tablet Take 50 mg by mouth daily.        . simvastatin (ZOCOR) 20 MG tablet Take 10 mg by mouth at bedtime.        Marland Kitchen warfarin (COUMADIN) 5 MG tablet Take daily by mouth as directed by the [facility]   60 tablet  2    Past Medical History  Diagnosis Date  . Coronary atherosclerosis of native coronary artery     Multivessel, LVEF 60%, occluded small nondominant RCA  (not bypassed)  . Atrial fibrillation   . Mixed hyperlipidemia   . Essential hypertension, benign   . Morbid obesity   . Carotid artery disease     Nonobstructive    Past Surgical History  Procedure Date  . Coronary artery bypass graft 2004    Off-pump LIMA to LAD, SVG to diagonal     History   Social History  . Marital Status: Married    Spouse Name: N/A    Number of Children: N/A  . Years of Education: N/A   Occupational History  . Retired     Disabled  .     Social History Main Topics  . Smoking status: Former Smoker -- 0.5 packs/day for 10 years    Types: Cigars    Quit date: 11/14/1996  . Smokeless tobacco: Never Used  . Alcohol Use: No  . Drug Use: No  . Sexually Active: Not on file   Other Topics Concern  . Not on file   Social History Narrative  . No narrative on file    Family History  Problem Relation Age of Onset  . Stroke Other     family h/o  . Cancer Other     family h/o    ROS:  Negative for exertional chest pain, DOE, orthopnea, PND, lower extremity edema, palpitations, presyncope/syncope, claudication, reflux, hematuria, hematochezia, or melena. Remaining systems reviewed, and are negative.  PHYSICAL EXAM:   BP 148/82  Pulse 71  Ht 6\' 5"  (1.956 m)  Wt 314 lb (142.429 kg)  BMI 37.23 kg/m2  GENERAL: well-nourished, well-developed; NAD HEENT: NCAT, PERRLA, EOMI NECK: palpable bilateral carotid pulses, no bruits; no JVD LUNGS: CTA bilaterally  HEART: RRR (S1, S2); no murmurs, rubs, or gallops ABDOMEN: soft, non-tender; intact BS EXTREMETIES: palpable bilateral femoral pulses, no bruits; intact distal pulses; no significant peripheral edema SKIN: warm/dry; no obvious rash/lesions MUSCULOSKELETAL: no gross abnormality NEURO: A/O (x3); NL affect   EKG: Atrial Fibrillation at 66 bpm; nonspecific ST changes; isolated PVC   ASSESSMENT & PLAN:

## 2011-04-22 NOTE — Progress Notes (Signed)
Discussed with Dr Diona Browner regarding issue of continued Coumadin anticoagulation, as well as possible addition of an antiarrhythmic, so as to restore NSR. After considering all therapeutic options, as well as the risk/benefit profile regarding stroke prophylaxis, plan is to continue current medication regimen as outlined, including Coumadin and low dose aspirin, for the foreseeable future. We will continue to review this issue, however, during scheduled followup visits.

## 2011-05-20 ENCOUNTER — Ambulatory Visit (INDEPENDENT_AMBULATORY_CARE_PROVIDER_SITE_OTHER): Payer: 59 | Admitting: *Deleted

## 2011-05-20 DIAGNOSIS — I4891 Unspecified atrial fibrillation: Secondary | ICD-10-CM

## 2011-05-20 LAB — POCT INR: INR: 3.3

## 2011-05-20 MED ORDER — WARFARIN SODIUM 5 MG PO TABS: ORAL_TABLET | ORAL | Status: DC

## 2011-06-10 ENCOUNTER — Other Ambulatory Visit: Payer: Self-pay | Admitting: Cardiology

## 2011-06-10 ENCOUNTER — Encounter: Payer: Self-pay | Admitting: *Deleted

## 2011-06-10 DIAGNOSIS — Z79899 Other long term (current) drug therapy: Secondary | ICD-10-CM

## 2011-06-10 DIAGNOSIS — E785 Hyperlipidemia, unspecified: Secondary | ICD-10-CM

## 2011-06-10 NOTE — Telephone Encounter (Signed)
[place] patient 

## 2011-06-10 NOTE — Telephone Encounter (Signed)
Pt will need FLP/LFT done. Last labs 10/20/10. Order will be mailed with a letter asking pt to have labs done before Sept office visit.

## 2011-06-17 ENCOUNTER — Ambulatory Visit (INDEPENDENT_AMBULATORY_CARE_PROVIDER_SITE_OTHER): Payer: 59 | Admitting: *Deleted

## 2011-06-17 DIAGNOSIS — I4891 Unspecified atrial fibrillation: Secondary | ICD-10-CM

## 2011-07-20 ENCOUNTER — Encounter: Payer: Self-pay | Admitting: Cardiology

## 2011-07-22 ENCOUNTER — Ambulatory Visit (INDEPENDENT_AMBULATORY_CARE_PROVIDER_SITE_OTHER): Payer: 59 | Admitting: Cardiology

## 2011-07-22 ENCOUNTER — Ambulatory Visit (INDEPENDENT_AMBULATORY_CARE_PROVIDER_SITE_OTHER): Payer: 59 | Admitting: *Deleted

## 2011-07-22 ENCOUNTER — Encounter: Payer: Self-pay | Admitting: Cardiology

## 2011-07-22 VITALS — BP 153/80 | HR 69 | Ht 77.0 in | Wt 308.0 lb

## 2011-07-22 DIAGNOSIS — I2510 Atherosclerotic heart disease of native coronary artery without angina pectoris: Secondary | ICD-10-CM

## 2011-07-22 DIAGNOSIS — E785 Hyperlipidemia, unspecified: Secondary | ICD-10-CM

## 2011-07-22 DIAGNOSIS — I4891 Unspecified atrial fibrillation: Secondary | ICD-10-CM

## 2011-07-22 DIAGNOSIS — I10 Essential (primary) hypertension: Secondary | ICD-10-CM

## 2011-07-22 MED ORDER — SIMVASTATIN 20 MG PO TABS: 10.0000 mg | ORAL_TABLET | Freq: Every day | ORAL | Status: DC

## 2011-07-22 NOTE — Assessment & Plan Note (Signed)
Symptomatically stable on medical therapy without active angina. Continue to encourage weight loss and exercise, although he seems to be limited by lower back pain. No changes made to current regimen. Continue observation.

## 2011-07-22 NOTE — Patient Instructions (Signed)
Your physician wants you to follow-up in: 6 months. You will receive a reminder letter in the mail one-two months in advance. If you don't receive a letter, please call our office to schedule the follow-up appointment. Decrease Zocor (simvastatin) to 10 mg every night. You may take 1/2 of your 20 mg tablets. Your physician recommends that you go to the [facility] for a FASTING lipid profile and liver function labs. Do not eat or drink after midnight. DO LABS BEFORE NEXT OFFICE VISIT IN 6 MONTHS.

## 2011-07-22 NOTE — Progress Notes (Signed)
Clinical Summary [name] is a 70 y.o.male presenting for followup. He was seen back in June.  Lab work from August showed AST 31, ALT 32, LDL 62, cholesterol 138, HDL 32, triglycerides 220. He is taking Zocor 10 mg daily. Tolerating it well.  Reports NYHA class II-III dyspnea on exertion, although stable and without active angina. No frank sense of palpitations with atrial fibrillation. No reported major bleeding problems of Coumadin.  Major limitation at this point is lower back pain. He would like to see an orthopedic specialist and plans to make these arrangements through his primary care physician.  We have discussed other options for treatment of atrial fibrillation including antiarrhythmic therapy, also Pradaxa. At this point he is most comfortable continuing with a strategy of heart rate control and Coumadin.  No Known Allergies  Medication list reviewed.  Past Medical History  Diagnosis Date  . Coronary atherosclerosis of native coronary artery     Multivessel, LVEF 60%, occluded small nondominant RCA  (not bypassed)  . Atrial fibrillation   . Mixed hyperlipidemia   . Essential hypertension, benign   . Morbid obesity   . Carotid artery disease     Nonobstructive    Past Surgical History  Procedure Date  . Coronary artery bypass graft 2004    Off-pump LIMA to LAD, SVG to diagonal    Family History  Problem Relation Age of Onset  . Stroke Other     family h/o  . Cancer Other     family h/o    Social History Mr. Ehle reports that he quit smoking about 14 years ago. His smoking use included Cigars. He has never used smokeless tobacco. Mr. Besecker reports that he does not drink alcohol.  Review of Systems Otherwise reviewed and negative.  Physical Examination Filed Vitals:   07/22/11 0828  BP: 153/80  Pulse: 69   Morbidly obese male, in no acute distress.  HEENT: Conjunctiva and lids normal, oropharynx with moist mucosa.  Neck: Neck brace in place.   Cardiac: Irregularly irregular, no S3 gallop or pathologic systolic murmur. PMI indistinct.  Lungs: Clear to auscultation.  Abdomen: Soft, nontender, no bruits, bowel sounds present.  Extremities: 1+ pitting edema, distal pulses 1+.  Skin: Warm and dry.  Musculoskeletal: No gross deformities.  Neuropsychiatric: Alert and oriented x3, affect appropriate.   ECG Reviewed in EMR.   Problem List and Plan

## 2011-07-22 NOTE — Assessment & Plan Note (Signed)
Continue on medical therapy for rate control, also Coumadin.

## 2011-07-22 NOTE — Assessment & Plan Note (Signed)
LDL well controlled. Continue low-dose simvastatin.

## 2011-07-22 NOTE — Assessment & Plan Note (Signed)
Diet and weight loss indicated, also sodium restriction.

## 2011-08-03 ENCOUNTER — Other Ambulatory Visit: Payer: Self-pay | Admitting: *Deleted

## 2011-08-03 MED ORDER — METOPROLOL SUCCINATE ER 50 MG PO TB24: 50.0000 mg | ORAL_TABLET | Freq: Every day | ORAL | Status: DC

## 2011-08-08 LAB — COMPREHENSIVE METABOLIC PANEL
ALT: 22
BUN: 7
Calcium: 8.8
Glucose, Bld: 116 — ABNORMAL HIGH
Sodium: 137
Total Protein: 6.2

## 2011-08-08 LAB — CARDIAC PANEL(CRET KIN+CKTOT+MB+TROPI)
CK, MB: 2.3
CK, MB: 2.8
CK, MB: 3.6
Relative Index: 2.2
Total CK: 150
Troponin I: 0.03
Troponin I: 0.1 — ABNORMAL HIGH

## 2011-08-08 LAB — D-DIMER, QUANTITATIVE: D-Dimer, Quant: <0.22

## 2011-08-08 LAB — CBC
Hemoglobin: 14.2
MCHC: 35.1
RDW: 14

## 2011-08-08 LAB — PROTIME-INR
INR: 1.1
Prothrombin Time: 14.4

## 2011-08-09 ENCOUNTER — Ambulatory Visit (INDEPENDENT_AMBULATORY_CARE_PROVIDER_SITE_OTHER): Payer: 59 | Admitting: *Deleted

## 2011-08-09 DIAGNOSIS — I4891 Unspecified atrial fibrillation: Secondary | ICD-10-CM

## 2011-08-09 LAB — POCT INR: INR: 4.1

## 2011-08-30 ENCOUNTER — Ambulatory Visit (INDEPENDENT_AMBULATORY_CARE_PROVIDER_SITE_OTHER): Payer: 59 | Admitting: *Deleted

## 2011-08-30 DIAGNOSIS — I4891 Unspecified atrial fibrillation: Secondary | ICD-10-CM

## 2011-08-30 LAB — POCT INR: INR: 2.4

## 2011-10-03 ENCOUNTER — Other Ambulatory Visit: Payer: Self-pay | Admitting: Cardiology

## 2011-10-11 ENCOUNTER — Ambulatory Visit (INDEPENDENT_AMBULATORY_CARE_PROVIDER_SITE_OTHER): Payer: 59 | Admitting: *Deleted

## 2011-10-11 DIAGNOSIS — Z7901 Long term (current) use of anticoagulants: Secondary | ICD-10-CM

## 2011-10-11 DIAGNOSIS — I4891 Unspecified atrial fibrillation: Secondary | ICD-10-CM

## 2011-11-11 ENCOUNTER — Ambulatory Visit (INDEPENDENT_AMBULATORY_CARE_PROVIDER_SITE_OTHER): Payer: 59 | Admitting: *Deleted

## 2011-11-11 DIAGNOSIS — I4891 Unspecified atrial fibrillation: Secondary | ICD-10-CM

## 2011-11-11 DIAGNOSIS — Z7901 Long term (current) use of anticoagulants: Secondary | ICD-10-CM

## 2011-11-17 ENCOUNTER — Other Ambulatory Visit: Payer: Self-pay | Admitting: *Deleted

## 2011-11-17 ENCOUNTER — Encounter: Payer: Self-pay | Admitting: *Deleted

## 2011-11-17 DIAGNOSIS — Z79899 Other long term (current) drug therapy: Secondary | ICD-10-CM

## 2011-11-17 DIAGNOSIS — E785 Hyperlipidemia, unspecified: Secondary | ICD-10-CM

## 2011-12-09 ENCOUNTER — Ambulatory Visit (INDEPENDENT_AMBULATORY_CARE_PROVIDER_SITE_OTHER): Payer: 59 | Admitting: *Deleted

## 2011-12-09 DIAGNOSIS — I4891 Unspecified atrial fibrillation: Secondary | ICD-10-CM

## 2011-12-09 DIAGNOSIS — Z7901 Long term (current) use of anticoagulants: Secondary | ICD-10-CM

## 2012-01-03 ENCOUNTER — Ambulatory Visit (INDEPENDENT_AMBULATORY_CARE_PROVIDER_SITE_OTHER): Payer: 59 | Admitting: *Deleted

## 2012-01-03 DIAGNOSIS — I4891 Unspecified atrial fibrillation: Secondary | ICD-10-CM

## 2012-01-03 DIAGNOSIS — Z7901 Long term (current) use of anticoagulants: Secondary | ICD-10-CM

## 2012-01-13 ENCOUNTER — Encounter: Payer: Self-pay | Admitting: *Deleted

## 2012-02-03 ENCOUNTER — Ambulatory Visit (INDEPENDENT_AMBULATORY_CARE_PROVIDER_SITE_OTHER): Payer: 59 | Admitting: *Deleted

## 2012-02-03 ENCOUNTER — Encounter: Payer: Self-pay | Admitting: Cardiology

## 2012-02-03 ENCOUNTER — Ambulatory Visit (INDEPENDENT_AMBULATORY_CARE_PROVIDER_SITE_OTHER): Payer: 59 | Admitting: Cardiology

## 2012-02-03 VITALS — BP 130/80 | HR 74 | Ht 77.0 in | Wt 308.4 lb

## 2012-02-03 DIAGNOSIS — E785 Hyperlipidemia, unspecified: Secondary | ICD-10-CM

## 2012-02-03 DIAGNOSIS — I6529 Occlusion and stenosis of unspecified carotid artery: Secondary | ICD-10-CM

## 2012-02-03 DIAGNOSIS — I10 Essential (primary) hypertension: Secondary | ICD-10-CM

## 2012-02-03 DIAGNOSIS — I4891 Unspecified atrial fibrillation: Secondary | ICD-10-CM

## 2012-02-03 DIAGNOSIS — I2510 Atherosclerotic heart disease of native coronary artery without angina pectoris: Secondary | ICD-10-CM

## 2012-02-03 DIAGNOSIS — Z7901 Long term (current) use of anticoagulants: Secondary | ICD-10-CM

## 2012-02-03 MED ORDER — SIMVASTATIN 10 MG PO TABS: 10.0000 mg | ORAL_TABLET | Freq: Every evening | ORAL | Status: DC

## 2012-02-03 NOTE — Assessment & Plan Note (Signed)
Stable carotid bruits. Previous carotid Dopplers from 12/10 showed less than 50% ICA stenoses. Will likely followup evaluation for his next visit.

## 2012-02-03 NOTE — Patient Instructions (Signed)
Your physician wants you to follow-up in: 6 months. You will receive a reminder letter in the mail one-two months in advance. If you don't receive a letter, please call our office to schedule the follow-up appointment. Your physician recommends that you continue on your current medications as directed. Please refer to the Current Medication list given to you today. 

## 2012-02-03 NOTE — Assessment & Plan Note (Signed)
Symptomatically stable on present medical therapy. No changes made today. ECG reviewed.

## 2012-02-03 NOTE — Progress Notes (Signed)
   Clinical Summary Mr. Kimberlin is a 71 y.o.male presenting for followup. He was seen in September 2012. He reports no active angina or palpitations. Has NYHA class 2-3 dyspnea on exertion which has been chronic. Weight is relatively stable.  Continues to have problems with chronic neck pain, has had some injections in the interim. He reports compliance with his medications otherwise, no bleeding problems on Coumadin.  Recent lab work reviewed with AST 24, ALT 28, cholesterol 141, triglycerides 177, HDL 30, LDL 76. We reviewed these today.  ECG is reviewed below.  No Known Allergies  Current Outpatient Prescriptions  Medication Sig Dispense Refill  . allopurinol (ZYLOPRIM) 100 MG tablet Take 100 mg by mouth daily.        Marland Kitchen amLODipine (NORVASC) 10 MG tablet Take 10 mg by mouth daily.        Marland Kitchen aspirin 81 MG tablet Take 81 mg by mouth daily.        Marland Kitchen lisinopril (PRINIVIL,ZESTRIL) 20 MG tablet Take 20 mg by mouth daily.        . metoprolol (TOPROL-XL) 50 MG 24 hr tablet Take 1 tablet (50 mg total) by mouth daily.  30 tablet  6  . warfarin (COUMADIN) 5 MG tablet TAKE DAILY BY MOUTH AS DIRECTED BY THE ANTICOAGULATION CLINIC  60 tablet  3  . simvastatin (ZOCOR) 10 MG tablet Take 1 tablet (10 mg total) by mouth every evening.  30 tablet  6    Past Medical History  Diagnosis Date  . Coronary atherosclerosis of native coronary artery     Multivessel, LVEF 60%, occluded small nondominant RCA  (not bypassed)  . Atrial fibrillation   . Mixed hyperlipidemia   . Essential hypertension, benign   . Morbid obesity   . Carotid artery disease     Nonobstructive    Past Surgical History  Procedure Date  . Coronary artery bypass graft 2004    Off-pump LIMA to LAD, SVG to diagonal    Social History Mr. Tierce reports that he quit smoking about 15 years ago. His smoking use included Cigars. He has never used smokeless tobacco. Mr. Iglesia reports that he does not drink alcohol.  Review of  Systems Negative except as outlined.  Physical Examination Filed Vitals:   02/03/12 0942  BP: 130/80  Pulse: 74    Morbidly obese male, in no acute distress.  HEENT: Conjunctiva and lids normal, oropharynx with moist mucosa.  Neck: Neck brace in place.  Cardiac: Irregularly irregular, no S3 gallop or pathologic systolic murmur. PMI indistinct.  Lungs: Clear to auscultation.  Abdomen: Soft, nontender, no bruits, bowel sounds present.  Extremities: 1+ pitting edema, distal pulses 1+.  Skin: Warm and dry.  Musculoskeletal: No gross deformities.  Neuropsychiatric: Alert and oriented x3, affect appropriate.   ECG Atrial fibrillation at 75 with nonspecific ST-T changes.   Problem List and Plan

## 2012-02-03 NOTE — Assessment & Plan Note (Signed)
Lipids are well controlled. ?

## 2012-02-03 NOTE — Assessment & Plan Note (Signed)
Continue present medical therapy. 

## 2012-02-03 NOTE — Assessment & Plan Note (Signed)
Persistent atrial fibrillation, no active palpitations. He continues on Coumadin with adequate rate control.

## 2012-02-27 ENCOUNTER — Other Ambulatory Visit: Payer: Self-pay | Admitting: Cardiology

## 2012-03-02 ENCOUNTER — Ambulatory Visit (INDEPENDENT_AMBULATORY_CARE_PROVIDER_SITE_OTHER): Payer: 59 | Admitting: *Deleted

## 2012-03-02 DIAGNOSIS — I4891 Unspecified atrial fibrillation: Secondary | ICD-10-CM

## 2012-03-02 DIAGNOSIS — Z7901 Long term (current) use of anticoagulants: Secondary | ICD-10-CM

## 2012-03-13 ENCOUNTER — Other Ambulatory Visit: Payer: Self-pay | Admitting: Cardiology

## 2012-03-30 ENCOUNTER — Ambulatory Visit (INDEPENDENT_AMBULATORY_CARE_PROVIDER_SITE_OTHER): Payer: 59 | Admitting: *Deleted

## 2012-03-30 DIAGNOSIS — I4891 Unspecified atrial fibrillation: Secondary | ICD-10-CM

## 2012-03-30 DIAGNOSIS — Z7901 Long term (current) use of anticoagulants: Secondary | ICD-10-CM

## 2012-03-30 LAB — POCT INR: INR: 1.2

## 2012-04-10 ENCOUNTER — Ambulatory Visit (INDEPENDENT_AMBULATORY_CARE_PROVIDER_SITE_OTHER): Payer: 59 | Admitting: *Deleted

## 2012-04-10 DIAGNOSIS — I4891 Unspecified atrial fibrillation: Secondary | ICD-10-CM

## 2012-04-10 DIAGNOSIS — Z7901 Long term (current) use of anticoagulants: Secondary | ICD-10-CM

## 2012-04-10 LAB — POCT INR: INR: 1.9

## 2012-05-04 ENCOUNTER — Ambulatory Visit (INDEPENDENT_AMBULATORY_CARE_PROVIDER_SITE_OTHER): Payer: 59 | Admitting: *Deleted

## 2012-05-04 DIAGNOSIS — Z7901 Long term (current) use of anticoagulants: Secondary | ICD-10-CM

## 2012-05-04 DIAGNOSIS — I4891 Unspecified atrial fibrillation: Secondary | ICD-10-CM

## 2012-06-01 ENCOUNTER — Ambulatory Visit (INDEPENDENT_AMBULATORY_CARE_PROVIDER_SITE_OTHER): Payer: 59 | Admitting: *Deleted

## 2012-06-01 DIAGNOSIS — Z7901 Long term (current) use of anticoagulants: Secondary | ICD-10-CM

## 2012-06-01 DIAGNOSIS — I4891 Unspecified atrial fibrillation: Secondary | ICD-10-CM

## 2012-06-01 LAB — POCT INR
INR: 2.3
INR: 2.4

## 2012-06-26 ENCOUNTER — Ambulatory Visit (INDEPENDENT_AMBULATORY_CARE_PROVIDER_SITE_OTHER): Payer: 59 | Admitting: *Deleted

## 2012-06-26 DIAGNOSIS — I4891 Unspecified atrial fibrillation: Secondary | ICD-10-CM

## 2012-06-26 DIAGNOSIS — Z7901 Long term (current) use of anticoagulants: Secondary | ICD-10-CM

## 2012-07-10 ENCOUNTER — Other Ambulatory Visit: Payer: Self-pay | Admitting: Cardiology

## 2012-07-10 ENCOUNTER — Other Ambulatory Visit: Payer: Self-pay | Admitting: *Deleted

## 2012-07-10 MED ORDER — WARFARIN SODIUM 5 MG PO TABS: ORAL_TABLET | ORAL | Status: DC

## 2012-08-07 ENCOUNTER — Encounter: Payer: Self-pay | Admitting: *Deleted

## 2012-08-07 ENCOUNTER — Telehealth: Payer: Self-pay | Admitting: *Deleted

## 2012-08-07 ENCOUNTER — Ambulatory Visit (INDEPENDENT_AMBULATORY_CARE_PROVIDER_SITE_OTHER): Payer: 59 | Admitting: *Deleted

## 2012-08-07 ENCOUNTER — Ambulatory Visit (INDEPENDENT_AMBULATORY_CARE_PROVIDER_SITE_OTHER): Payer: 59 | Admitting: Cardiology

## 2012-08-07 ENCOUNTER — Encounter: Payer: Self-pay | Admitting: Cardiology

## 2012-08-07 VITALS — BP 115/69 | HR 65 | Ht 77.0 in | Wt 295.4 lb

## 2012-08-07 DIAGNOSIS — Z7901 Long term (current) use of anticoagulants: Secondary | ICD-10-CM

## 2012-08-07 DIAGNOSIS — I6529 Occlusion and stenosis of unspecified carotid artery: Secondary | ICD-10-CM

## 2012-08-07 DIAGNOSIS — I4891 Unspecified atrial fibrillation: Secondary | ICD-10-CM

## 2012-08-07 DIAGNOSIS — I10 Essential (primary) hypertension: Secondary | ICD-10-CM

## 2012-08-07 DIAGNOSIS — I2510 Atherosclerotic heart disease of native coronary artery without angina pectoris: Secondary | ICD-10-CM

## 2012-08-07 MED ORDER — METOPROLOL SUCCINATE ER 25 MG PO TB24: 25.0000 mg | ORAL_TABLET | Freq: Every day | ORAL | Status: DC

## 2012-08-07 MED ORDER — METOPROLOL SUCCINATE ER 50 MG PO TB24: 50.0000 mg | ORAL_TABLET | Freq: Every day | ORAL | Status: DC

## 2012-08-07 MED ORDER — AMLODIPINE BESYLATE 10 MG PO TABS: 10.0000 mg | ORAL_TABLET | Freq: Every day | ORAL | Status: DC

## 2012-08-07 NOTE — Progress Notes (Signed)
This encounter was created in error - please disregard.

## 2012-08-07 NOTE — Telephone Encounter (Signed)
Pharmacist called to say that patient has been on toprol xl 50 mg daily not 25 mg. Nurse informed pharmacist that dose was to stay the same.

## 2012-08-07 NOTE — Assessment & Plan Note (Signed)
Blood pressure is well-controlled today. 

## 2012-08-07 NOTE — Patient Instructions (Addendum)

## 2012-08-07 NOTE — Assessment & Plan Note (Signed)
No active angina on medical therapy. Refills were provided for Toprol-XL and Norvasc. I recommended that he stay active as tolerated. We will keep our 6 month followup, unless he needs to be seen sooner.

## 2012-08-07 NOTE — Assessment & Plan Note (Signed)
Chronic, rate controlled on current regimen. Coumadin is temporarily on hold pending spinal injection.

## 2012-08-07 NOTE — Assessment & Plan Note (Signed)
Nonobstructive by last assessment. Will consider a followup duplex scan over the next 6 months.

## 2012-08-07 NOTE — Progress Notes (Signed)
Clinical Summary Clifford King is a 71 y.o.male presenting for followup. He was seen in March. He has been doing relatively well from a cardiac perspective, no angina or significant palpitations. He remains in atrial fibrillation. Coumadin has been on hold recently with plan for spinal injection for pain management. He continues to struggle with neck and back pain, followed in a pain clinic. He states there has been some discussion about the possibility of a stimulator.  He reports compliance with his cardiac medications, needed refills for Toprol-XL and Norvasc. These were provided.  I recommended that he stay active within his functional limitations.   No Known Allergies  Current Outpatient Prescriptions  Medication Sig Dispense Refill  . allopurinol (ZYLOPRIM) 100 MG tablet Take 100 mg by mouth daily.        Marland Kitchen amLODipine (NORVASC) 10 MG tablet Take 1 tablet (10 mg total) by mouth daily.  90 tablet  3  . aspirin 81 MG tablet Take 81 mg by mouth daily.        Marland Kitchen HYDROcodone-acetaminophen (LORTAB) 10-500 MG per tablet Take 1 tablet by mouth at bedtime.      Marland Kitchen lisinopril (PRINIVIL,ZESTRIL) 20 MG tablet Take 20 mg by mouth daily.        . metoprolol succinate (TOPROL-XL) 25 MG 24 hr tablet Take 1 tablet (25 mg total) by mouth daily.  90 tablet  3  . simvastatin (ZOCOR) 10 MG tablet Take 1 tablet (10 mg total) by mouth every evening.  30 tablet  6  . warfarin (COUMADIN) 5 MG tablet TAKE ONE TABLET BY MOUTH AS DIRECTED BY THE ANTICOAGULATION CLINIC  60 tablet  3  . DISCONTD: amLODipine (NORVASC) 10 MG tablet Take 10 mg by mouth daily.        . Oxycodone HCl 10 MG TABS Take 10 mg by mouth 4 (four) times daily as needed.      Marland Kitchen DISCONTD: omeprazole (PRILOSEC) 20 MG capsule Take 1 capsule by mouth Daily.        Past Medical History  Diagnosis Date  . Coronary atherosclerosis of native coronary artery     Multivessel, LVEF 60%, occluded small nondominant RCA  (not bypassed)  . Atrial  fibrillation   . Mixed hyperlipidemia   . Essential hypertension, benign   . Morbid obesity   . Carotid artery disease     Nonobstructive    Past Surgical History  Procedure Date  . Coronary artery bypass graft 2004    Off-pump LIMA to LAD, SVG to diagonal    Social History Clifford King reports that he quit smoking about 25 years ago. His smoking use included Cigars. He has never used smokeless tobacco. Clifford King reports that he does not drink alcohol.  Review of Systems Otherwise negative.  Physical Examination Filed Vitals:   08/07/12 1448  BP: 115/69  Pulse: 65   Filed Weights   08/07/12 1448  Weight: 295 lb 6.4 oz (133.993 kg)    HEENT: Conjunctiva and lids normal, oropharynx with moist mucosa.  Neck: Neck brace in place.  Cardiac: Irregularly irregular, no S3 gallop or pathologic systolic murmur. PMI indistinct.  Lungs: Clear to auscultation.  Abdomen: Soft, nontender, no bruits, bowel sounds present.  Extremities: 1+ pitting edema, distal pulses 1+.  Skin: Warm and dry.  Musculoskeletal: No gross deformities.  Neuropsychiatric: Alert and oriented x3, affect appropriate.    Problem List and Plan   CAD, NATIVE VESSEL No active angina on medical therapy. Refills were provided for  Toprol-XL and Norvasc. I recommended that he stay active as tolerated. We will keep our 6 month followup, unless he needs to be seen sooner.  ATRIAL FIBRILLATION Chronic, rate controlled on current regimen. Coumadin is temporarily on hold pending spinal injection.  ESSENTIAL HYPERTENSION, BENIGN Blood pressure is well controlled today.  CAROTID ARTERY DISEASE Nonobstructive by last assessment. Will consider a followup duplex scan over the next 6 months.    Jonelle Sidle, M.D., F.A.C.C.

## 2012-08-24 ENCOUNTER — Other Ambulatory Visit: Payer: Self-pay | Admitting: Cardiology

## 2012-08-31 ENCOUNTER — Ambulatory Visit (INDEPENDENT_AMBULATORY_CARE_PROVIDER_SITE_OTHER): Payer: 59 | Admitting: *Deleted

## 2012-08-31 DIAGNOSIS — Z7901 Long term (current) use of anticoagulants: Secondary | ICD-10-CM

## 2012-08-31 DIAGNOSIS — I4891 Unspecified atrial fibrillation: Secondary | ICD-10-CM

## 2012-08-31 LAB — POCT INR: INR: 1.7

## 2012-09-28 ENCOUNTER — Ambulatory Visit (INDEPENDENT_AMBULATORY_CARE_PROVIDER_SITE_OTHER): Payer: 59 | Admitting: *Deleted

## 2012-09-28 DIAGNOSIS — I4891 Unspecified atrial fibrillation: Secondary | ICD-10-CM

## 2012-09-28 DIAGNOSIS — Z7901 Long term (current) use of anticoagulants: Secondary | ICD-10-CM

## 2012-09-28 LAB — POCT INR: INR: 2.2

## 2012-10-25 ENCOUNTER — Other Ambulatory Visit: Payer: Self-pay | Admitting: Cardiology

## 2012-10-25 MED ORDER — SIMVASTATIN 10 MG PO TABS: 10.0000 mg | ORAL_TABLET | Freq: Every day | ORAL | Status: DC

## 2012-10-25 MED ORDER — WARFARIN SODIUM 5 MG PO TABS: ORAL_TABLET | ORAL | Status: DC

## 2012-11-02 ENCOUNTER — Ambulatory Visit (INDEPENDENT_AMBULATORY_CARE_PROVIDER_SITE_OTHER): Payer: 59 | Admitting: *Deleted

## 2012-11-02 DIAGNOSIS — I4891 Unspecified atrial fibrillation: Secondary | ICD-10-CM

## 2012-11-02 DIAGNOSIS — Z7901 Long term (current) use of anticoagulants: Secondary | ICD-10-CM

## 2012-11-02 LAB — POCT INR: INR: 1.9

## 2012-12-14 ENCOUNTER — Ambulatory Visit (INDEPENDENT_AMBULATORY_CARE_PROVIDER_SITE_OTHER): Payer: 59 | Admitting: *Deleted

## 2012-12-14 DIAGNOSIS — Z7901 Long term (current) use of anticoagulants: Secondary | ICD-10-CM

## 2012-12-14 DIAGNOSIS — I4891 Unspecified atrial fibrillation: Secondary | ICD-10-CM

## 2013-01-25 ENCOUNTER — Ambulatory Visit (INDEPENDENT_AMBULATORY_CARE_PROVIDER_SITE_OTHER): Payer: 59 | Admitting: *Deleted

## 2013-01-25 DIAGNOSIS — I4891 Unspecified atrial fibrillation: Secondary | ICD-10-CM

## 2013-01-25 DIAGNOSIS — Z7901 Long term (current) use of anticoagulants: Secondary | ICD-10-CM

## 2013-01-25 LAB — POCT INR: INR: 2.6

## 2013-02-06 ENCOUNTER — Encounter: Payer: Self-pay | Admitting: Cardiology

## 2013-02-06 ENCOUNTER — Ambulatory Visit (INDEPENDENT_AMBULATORY_CARE_PROVIDER_SITE_OTHER): Payer: 59 | Admitting: Cardiology

## 2013-02-06 VITALS — BP 137/83 | HR 67 | Ht 77.0 in | Wt 307.0 lb

## 2013-02-06 DIAGNOSIS — I779 Disorder of arteries and arterioles, unspecified: Secondary | ICD-10-CM

## 2013-02-06 DIAGNOSIS — I4891 Unspecified atrial fibrillation: Secondary | ICD-10-CM

## 2013-02-06 DIAGNOSIS — I2510 Atherosclerotic heart disease of native coronary artery without angina pectoris: Secondary | ICD-10-CM

## 2013-02-06 DIAGNOSIS — E785 Hyperlipidemia, unspecified: Secondary | ICD-10-CM

## 2013-02-06 NOTE — Assessment & Plan Note (Signed)
No active angina symptoms. Has done well on medical therapy.

## 2013-02-06 NOTE — Assessment & Plan Note (Signed)
Keep followup with primary care. He has tolerated low-dose simvastatin.

## 2013-02-06 NOTE — Progress Notes (Signed)
Clinical Summary Clifford King is a 72 y.o.male last seen in September 2013. He reports no angina symptoms. Has NYHA class 2-3 dyspnea which is chronic. He reports compliance with his cardiac medications. No major bleeding episodes on Coumadin.  Still struggles with back pain and neck pain. He was getting some injections, but is not interested in pursuing surgery.  He reports no palpitations. ECG today shows atrial fibrillation with good rate control, PVC.   No Known Allergies  Current Outpatient Prescriptions  Medication Sig Dispense Refill  . allopurinol (ZYLOPRIM) 100 MG tablet Take 100 mg by mouth daily.        Marland Kitchen amLODipine (NORVASC) 10 MG tablet Take 1 tablet (10 mg total) by mouth daily.  90 tablet  3  . aspirin 81 MG tablet Take 81 mg by mouth daily.        Marland Kitchen lisinopril (PRINIVIL,ZESTRIL) 20 MG tablet Take 20 mg by mouth daily.        . metoprolol succinate (TOPROL-XL) 50 MG 24 hr tablet Take 1 tablet (50 mg total) by mouth daily. Take with or immediately following a meal.  90 tablet  3  . Nystatin (NYAMYC) 100000 UNIT/GM POWD as needed.       . Oxycodone HCl 10 MG TABS Take 10 mg by mouth 4 (four) times daily as needed.      . simvastatin (ZOCOR) 10 MG tablet Take 1 tablet (10 mg total) by mouth at bedtime.  30 tablet  6  . warfarin (COUMADIN) 5 MG tablet TAKE ONE TABLET BY MOUTH AS DIRECTED BY THE ANTICOAGULATION CLINIC  60 tablet  3  . [DISCONTINUED] omeprazole (PRILOSEC) 20 MG capsule Take 1 capsule by mouth Daily.       No current facility-administered medications for this visit.    Past Medical History  Diagnosis Date  . Coronary atherosclerosis of native coronary artery     Multivessel, LVEF 60%, occluded small nondominant RCA  (not bypassed)  . Atrial fibrillation   . Mixed hyperlipidemia   . Essential hypertension, benign   . Morbid obesity   . Carotid artery disease     Nonobstructive    Past Surgical History  Procedure Laterality Date  . Coronary artery  bypass graft  2004    Off-pump LIMA to LAD, SVG to diagonal    Social History Mr. Meadow reports that he quit smoking about 26 years ago. His smoking use included Cigars. He has never used smokeless tobacco. Mr. Reels reports that he does not drink alcohol.  Review of Systems Complains of varicose veins. He does wear compression hose. Otherwise negative.  Physical Examination Filed Vitals:   02/06/13 1340  BP: 137/83  Pulse: 67   Filed Weights   02/06/13 1340  Weight: 307 lb (139.254 kg)   HEENT: Conjunctiva and lids normal, oropharynx with moist mucosa.  Neck: Neck brace in place.  Cardiac: Irregularly irregular, no S3 gallop or pathologic systolic murmur. PMI indistinct.  Lungs: Clear to auscultation.  Abdomen: Soft, nontender, no bruits, bowel sounds present.  Extremities: 1+ pitting edema, distal pulses 1+. Varicose veins.  Skin: Warm and dry.  Musculoskeletal: No gross deformities.  Neuropsychiatric: Alert and oriented x3, affect appropriate.   Problem List and Plan   ATRIAL FIBRILLATION Good heart rate control on current regimen. Otherwise continue Coumadin. Followup arranged.  CAD, NATIVE VESSEL No active angina symptoms. Has done well on medical therapy.  HYPERLIPIDEMIA-MIXED Keep followup with primary care. He has tolerated low-dose simvastatin.  CAROTID ARTERY  DISEASE Nonobstructive, less than 50% LICA in December 2010.    Jonelle Sidle, M.D., F.A.C.C.

## 2013-02-06 NOTE — Assessment & Plan Note (Signed)
Nonobstructive, less than 50% LICA in December 2010.

## 2013-02-06 NOTE — Patient Instructions (Signed)
Continue all current medications. Your physician wants you to follow up in: 6 months.  You will receive a reminder letter in the mail one-two months in advance.  If you don't receive a letter, please call our office to schedule the follow up appointment   

## 2013-02-06 NOTE — Assessment & Plan Note (Signed)
Good heart rate control on current regimen. Otherwise continue Coumadin. Followup arranged.

## 2013-03-08 ENCOUNTER — Ambulatory Visit (INDEPENDENT_AMBULATORY_CARE_PROVIDER_SITE_OTHER): Payer: 59 | Admitting: *Deleted

## 2013-03-08 DIAGNOSIS — I4891 Unspecified atrial fibrillation: Secondary | ICD-10-CM

## 2013-03-08 DIAGNOSIS — Z7901 Long term (current) use of anticoagulants: Secondary | ICD-10-CM

## 2013-03-08 LAB — POCT INR: INR: 1.9

## 2013-04-19 ENCOUNTER — Ambulatory Visit (INDEPENDENT_AMBULATORY_CARE_PROVIDER_SITE_OTHER): Payer: 59 | Admitting: *Deleted

## 2013-04-19 DIAGNOSIS — I4891 Unspecified atrial fibrillation: Secondary | ICD-10-CM

## 2013-04-19 DIAGNOSIS — Z7901 Long term (current) use of anticoagulants: Secondary | ICD-10-CM

## 2013-04-22 ENCOUNTER — Other Ambulatory Visit: Payer: Self-pay | Admitting: Cardiology

## 2013-04-23 ENCOUNTER — Other Ambulatory Visit: Payer: Self-pay | Admitting: *Deleted

## 2013-04-23 MED ORDER — WARFARIN SODIUM 5 MG PO TABS: ORAL_TABLET | ORAL | Status: DC

## 2013-05-31 ENCOUNTER — Ambulatory Visit (INDEPENDENT_AMBULATORY_CARE_PROVIDER_SITE_OTHER): Payer: 59 | Admitting: *Deleted

## 2013-05-31 DIAGNOSIS — I4891 Unspecified atrial fibrillation: Secondary | ICD-10-CM

## 2013-05-31 DIAGNOSIS — Z7901 Long term (current) use of anticoagulants: Secondary | ICD-10-CM

## 2013-05-31 LAB — POCT INR: INR: 2.1

## 2013-07-12 ENCOUNTER — Ambulatory Visit (INDEPENDENT_AMBULATORY_CARE_PROVIDER_SITE_OTHER): Payer: 59 | Admitting: *Deleted

## 2013-07-12 DIAGNOSIS — Z7901 Long term (current) use of anticoagulants: Secondary | ICD-10-CM

## 2013-07-12 DIAGNOSIS — I4891 Unspecified atrial fibrillation: Secondary | ICD-10-CM

## 2013-07-12 LAB — POCT INR: INR: 3.2

## 2013-07-23 ENCOUNTER — Telehealth: Payer: Self-pay | Admitting: *Deleted

## 2013-07-23 NOTE — Telephone Encounter (Signed)
Pt states he was in the [facility] ED on Sunday morning with gout.  They gave him 2 shots and started him on motrin 800mg  daily x 14 days.  I told pt to decrease coumadin does to 2 tablets daily except 1 tablet on M,W,F till finished with Motrin and come for INR check on 08/09/13.  Pt verbalized understanding and agreement.

## 2013-08-08 ENCOUNTER — Encounter: Payer: Self-pay | Admitting: *Deleted

## 2013-08-08 ENCOUNTER — Ambulatory Visit (INDEPENDENT_AMBULATORY_CARE_PROVIDER_SITE_OTHER): Payer: 59 | Admitting: Cardiology

## 2013-08-08 ENCOUNTER — Ambulatory Visit (INDEPENDENT_AMBULATORY_CARE_PROVIDER_SITE_OTHER): Payer: 59 | Admitting: *Deleted

## 2013-08-08 ENCOUNTER — Encounter: Payer: Self-pay | Admitting: Cardiology

## 2013-08-08 VITALS — BP 163/79 | HR 64 | Ht 77.0 in | Wt 300.1 lb

## 2013-08-08 DIAGNOSIS — E785 Hyperlipidemia, unspecified: Secondary | ICD-10-CM

## 2013-08-08 DIAGNOSIS — I4891 Unspecified atrial fibrillation: Secondary | ICD-10-CM

## 2013-08-08 DIAGNOSIS — Z7901 Long term (current) use of anticoagulants: Secondary | ICD-10-CM

## 2013-08-08 DIAGNOSIS — Z79899 Other long term (current) drug therapy: Secondary | ICD-10-CM

## 2013-08-08 LAB — POCT INR: INR: 2.7

## 2013-08-08 MED ORDER — WARFARIN SODIUM 5 MG PO TABS: 5.0000 mg | ORAL_TABLET | ORAL | Status: DC

## 2013-08-08 MED ORDER — METOPROLOL SUCCINATE ER 50 MG PO TB24: 50.0000 mg | ORAL_TABLET | Freq: Every day | ORAL | Status: DC

## 2013-08-08 MED ORDER — AMLODIPINE BESYLATE 10 MG PO TABS: 10.0000 mg | ORAL_TABLET | Freq: Every day | ORAL | Status: DC

## 2013-08-08 MED ORDER — SIMVASTATIN 10 MG PO TABS: 10.0000 mg | ORAL_TABLET | Freq: Every day | ORAL | Status: DC

## 2013-08-08 NOTE — Progress Notes (Signed)
   Clinical Summary Clifford King is a 72 y.o.male last seen in March. He reports no palpitations or chest pain. Has not had any major bleeding problems with Coumadin. 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 He is not on any regular NSAIDs.  LDL was 76 last year. Followup labs to be obtained.   He still presently walking for exercise. He has not been hospitalized for cardiac reasons.  Allergies  Allergen Reactions  . Vicodin [Hydrocodone-Acetaminophen] Other (See Comments)    hallucinations    Current Outpatient Prescriptions  Medication Sig Dispense Refill  . allopurinol (ZYLOPRIM) 100 MG tablet Take 100 mg by mouth daily.        Marland Kitchen amLODipine (NORVASC) 10 MG tablet Take 1 tablet (10 mg total) by mouth daily.  90 tablet  3  . aspirin 81 MG tablet Take 81 mg by mouth daily.        Marland Kitchen lisinopril (PRINIVIL,ZESTRIL) 20 MG tablet Take 20 mg by mouth daily.        . metoprolol succinate (TOPROL-XL) 50 MG 24 hr tablet Take 1 tablet (50 mg total) by mouth daily. Take with or immediately following a meal.  90 tablet  3  . Nystatin (NYAMYC) 100000 UNIT/GM POWD as needed.       . simvastatin (ZOCOR) 10 MG tablet Take 1 tablet (10 mg total) by mouth at bedtime.  30 tablet  6  . warfarin (COUMADIN) 5 MG tablet TAKE ONE TABLET BY MOUTH AS DIRECTED BY THE ANTICOAGULATION CLINIC (managed by Misty Stanley)      . [DISCONTINUED] omeprazole (PRILOSEC) 20 MG capsule Take 1 capsule by mouth Daily.       No current facility-administered medications for this visit.    Past Medical History  Diagnosis Date  . Coronary atherosclerosis of native coronary artery     Multivessel, LVEF 60%, occluded small nondominant RCA  (not bypassed)  . Atrial fibrillation   . Mixed hyperlipidemia   . Essential hypertension, benign   . Morbid obesity   . Carotid artery disease     Nonobstructive    Past Surgical History  Procedure Laterality Date  . Coronary artery bypass graft  2004   Off-pump LIMA to LAD, SVG to diagonal    Social History Clifford King reports that he quit smoking about 26 years ago. His smoking use included Cigars. He has never used smokeless tobacco. Clifford King reports that he does not drink alcohol.  Review of Systems As outlined above, otherwise negative.  Physical Examination Filed Vitals:   08/08/13 1426  BP: 163/79  Pulse: 64   Filed Weights   08/08/13 1426  Weight: 300 lb 1.9 oz (136.134 kg)   Obese, appears comfortable. HEENT: Conjunctiva and lids normal, oropharynx with moist mucosa.  Neck: Neck brace in place.  Cardiac: Irregularly irregular, no S3 gallop or pathologic systolic murmur. PMI indistinct.  Lungs: Clear to auscultation.  Abdomen: Soft, nontender, no bruits, bowel sounds present.  Extremities: 1+ pitting edema, distal pulses 1+. Varicose veins. Soft brace on left knee. Skin: Warm and dry.  Musculoskeletal: No gross deformities.  Neuropsychiatric: Alert and oriented x3, affect appropriate.   Problem List and Plan   Atrial fibrillation Continue strategy of heart rate control and anticoagulation. INR 2.7 today.  CAD, NATIVE VESSEL No active angina symptoms. Continue observation.  HYPERLIPIDEMIA-MIXED Check followup FLP and LFTs.    Jonelle Sidle, M.D., F.A.C.C.

## 2013-08-08 NOTE — Assessment & Plan Note (Signed)
Check followup FLP and LFTs.

## 2013-08-08 NOTE — Assessment & Plan Note (Signed)
No active angina symptoms. Continue observation. 

## 2013-08-08 NOTE — Assessment & Plan Note (Signed)
Continue strategy of heart rate control and anticoagulation. INR 2.7 today.

## 2013-08-08 NOTE — Patient Instructions (Addendum)
Your physician recommends that you schedule a follow-up appointment in: 6 months. You will receive a reminder letter in the mail in about 4 months reminding you to call and schedule your appointment. If you don't receive this letter, please contact our office. Your physician recommends that you continue on your current medications as directed. Please refer to the Current Medication list given to you today. Your physician recommends that you have FASTING lipid/liver profile. Please do not eat or drink for at least 8 hours except a sip of water to take with your medications. You may have this done at [facility] which is located : see below                      [address]     [address] (across from [employer]) M-F 7:00 am to 6:00 pm  Sat 8:00 am to 12:00

## 2013-08-08 NOTE — Progress Notes (Signed)
This encounter was created in error - please disregard.

## 2013-08-20 ENCOUNTER — Other Ambulatory Visit: Payer: Self-pay | Admitting: Cardiology

## 2013-08-20 LAB — HEPATIC FUNCTION PANEL
ALT: 23 U/L (ref 0–53)
AST: 20 U/L (ref 0–37)
Albumin: 4.2 g/dL (ref 3.5–5.2)
Indirect Bilirubin: 0.8 mg/dL (ref 0.0–0.9)
Total Protein: 6.5 g/dL (ref 6.0–8.3)

## 2013-08-20 LAB — LIPID PANEL
Cholesterol: 122 mg/dL (ref 0–200)
HDL: 31 mg/dL — ABNORMAL LOW (ref >39)
LDL Cholesterol: 62 mg/dL (ref 0–99)
Total CHOL/HDL Ratio: 3.9 Ratio
Triglycerides: 143 mg/dL (ref <150)
VLDL: 29 mg/dL (ref 0–40)

## 2013-08-22 ENCOUNTER — Telehealth: Payer: Self-pay | Admitting: *Deleted

## 2013-08-22 NOTE — Telephone Encounter (Signed)
Message copied by Eustace Moore on Thu Aug 22, 2013  3:00 PM ------      Message from: MCDOWELL, Illene Bolus      Created: Wed Aug 21, 2013  8:12 AM       Reviewed. LDL is a: 62 and LFTs are normal. Continue same regimen. ------

## 2013-08-22 NOTE — Telephone Encounter (Signed)
Patient informed. 

## 2013-09-06 ENCOUNTER — Ambulatory Visit (INDEPENDENT_AMBULATORY_CARE_PROVIDER_SITE_OTHER): Payer: 59 | Admitting: *Deleted

## 2013-09-06 DIAGNOSIS — Z7901 Long term (current) use of anticoagulants: Secondary | ICD-10-CM

## 2013-09-06 DIAGNOSIS — I4891 Unspecified atrial fibrillation: Secondary | ICD-10-CM

## 2013-10-18 ENCOUNTER — Ambulatory Visit (INDEPENDENT_AMBULATORY_CARE_PROVIDER_SITE_OTHER): Payer: 59 | Admitting: *Deleted

## 2013-10-18 DIAGNOSIS — I4891 Unspecified atrial fibrillation: Secondary | ICD-10-CM

## 2013-10-18 DIAGNOSIS — Z7901 Long term (current) use of anticoagulants: Secondary | ICD-10-CM

## 2013-10-18 LAB — POCT INR: INR: 3.5

## 2013-10-25 ENCOUNTER — Other Ambulatory Visit: Payer: Self-pay | Admitting: Cardiology

## 2013-10-25 MED ORDER — WARFARIN SODIUM 5 MG PO TABS: 5.0000 mg | ORAL_TABLET | ORAL | Status: DC

## 2013-11-29 ENCOUNTER — Ambulatory Visit (INDEPENDENT_AMBULATORY_CARE_PROVIDER_SITE_OTHER): Payer: 59 | Admitting: *Deleted

## 2013-11-29 DIAGNOSIS — Z7901 Long term (current) use of anticoagulants: Secondary | ICD-10-CM

## 2013-11-29 DIAGNOSIS — I4891 Unspecified atrial fibrillation: Secondary | ICD-10-CM

## 2013-11-29 LAB — POCT INR: INR: 3

## 2013-12-27 ENCOUNTER — Ambulatory Visit (INDEPENDENT_AMBULATORY_CARE_PROVIDER_SITE_OTHER): Payer: 59 | Admitting: *Deleted

## 2013-12-27 DIAGNOSIS — Z7901 Long term (current) use of anticoagulants: Secondary | ICD-10-CM

## 2013-12-27 DIAGNOSIS — I4891 Unspecified atrial fibrillation: Secondary | ICD-10-CM

## 2013-12-27 DIAGNOSIS — Z5181 Encounter for therapeutic drug level monitoring: Secondary | ICD-10-CM | POA: Insufficient documentation

## 2013-12-27 LAB — POCT INR: INR: 2.8

## 2014-01-24 ENCOUNTER — Encounter: Payer: Self-pay | Admitting: Cardiology

## 2014-01-24 ENCOUNTER — Ambulatory Visit (INDEPENDENT_AMBULATORY_CARE_PROVIDER_SITE_OTHER): Payer: 59 | Admitting: *Deleted

## 2014-01-24 ENCOUNTER — Ambulatory Visit (INDEPENDENT_AMBULATORY_CARE_PROVIDER_SITE_OTHER): Payer: 59 | Admitting: Cardiology

## 2014-01-24 VITALS — BP 133/75 | HR 81 | Ht 77.0 in | Wt 298.0 lb

## 2014-01-24 DIAGNOSIS — I2510 Atherosclerotic heart disease of native coronary artery without angina pectoris: Secondary | ICD-10-CM

## 2014-01-24 DIAGNOSIS — I10 Essential (primary) hypertension: Secondary | ICD-10-CM

## 2014-01-24 DIAGNOSIS — E785 Hyperlipidemia, unspecified: Secondary | ICD-10-CM

## 2014-01-24 DIAGNOSIS — I4891 Unspecified atrial fibrillation: Secondary | ICD-10-CM

## 2014-01-24 DIAGNOSIS — Z5181 Encounter for therapeutic drug level monitoring: Secondary | ICD-10-CM

## 2014-01-24 DIAGNOSIS — Z7901 Long term (current) use of anticoagulants: Secondary | ICD-10-CM

## 2014-01-24 LAB — POCT INR: INR: 2.8

## 2014-01-24 NOTE — Patient Instructions (Signed)
Continue all current medications. Your physician wants you to follow up in: 6 months.  You will receive a reminder letter in the mail one-two months in advance.  If you don't receive a letter, please call our office to schedule the follow up appointment   

## 2014-01-24 NOTE — Assessment & Plan Note (Signed)
He continues on Zocor with good lipid control over time. Keep followup with Dr. Barron AlvineFatade.

## 2014-01-24 NOTE — Assessment & Plan Note (Signed)
To have Coumadin clinic visit today with INR. ECG reviewed showing atrial fibrillation with PVCs versus aberrantly conducted beats. Continue current regimen.

## 2014-01-24 NOTE — Assessment & Plan Note (Signed)
No change to current regimen. 

## 2014-01-24 NOTE — Progress Notes (Signed)
Clinical Summary [name] is a 73 y.o.male last seen in September 2014. He reports no new cardiac symptoms. Has been trying to walk more and has found that his stamina has improved. No palpitations, no bleeding problems on Coumadin.  Lab work from October 2014 showed cholesterol 122, triglycerides 143, HDL 31, LDL 62, and normal LFTs.  He does tell me that he had a mechanical fall and hit his left mid flank area on a log. States that he was seen in the ER in [place] and had a chest x-ray and also CT scan. Was told that he had a "spot" on his lung. Also placed on antibiotics and analgesics, has had a mildly productive cough, no fevers or chills. He is to see his primary care provider for further evaluation.   Allergies  Allergen Reactions  . Vicodin [Hydrocodone-Acetaminophen] Other (See Comments)    hallucinations    Current Outpatient Prescriptions  Medication Sig Dispense Refill  . allopurinol (ZYLOPRIM) 100 MG tablet Take 100 mg by mouth daily.        Marland Kitchen. amLODipine (NORVASC) 10 MG tablet Take 1 tablet (10 mg total) by mouth daily.  90 tablet  3  . aspirin 81 MG tablet Take 81 mg by mouth daily.        Marland Kitchen. lisinopril (PRINIVIL,ZESTRIL) 20 MG tablet Take 20 mg by mouth daily.        . metoprolol succinate (TOPROL-XL) 50 MG 24 hr tablet Take 1 tablet (50 mg total) by mouth daily. Take with or immediately following a meal.  90 tablet  3  . Nystatin (NYAMYC) 100000 UNIT/GM POWD as needed.       . simvastatin (ZOCOR) 10 MG tablet Take 1 tablet (10 mg total) by mouth at bedtime.  90 tablet  3  . warfarin (COUMADIN) 5 MG tablet Take 1 tablet (5 mg total) by mouth as directed.  90 tablet  3  . HYDROcodone-acetaminophen (NORCO/VICODIN) 5-325 MG per tablet Take 1 tablet by mouth every 6 (six) hours as needed for moderate pain.      Marland Kitchen. levofloxacin (LEVAQUIN) 750 MG tablet Take 750 mg by mouth daily.       . [DISCONTINUED] omeprazole (PRILOSEC) 20 MG capsule Take 1 capsule by mouth Daily.        No current facility-administered medications for this visit.    Past Medical History  Diagnosis Date  . Coronary atherosclerosis of native coronary artery     Multivessel, LVEF 60%, occluded small nondominant RCA  (not bypassed)  . Atrial fibrillation   . Mixed hyperlipidemia   . Essential hypertension, benign   . Morbid obesity   . Carotid artery disease     Nonobstructive    Past Surgical History  Procedure Laterality Date  . Coronary artery bypass graft  2004    Off-pump LIMA to LAD, SVG to diagonal    Social History [name] reports that he quit smoking about 27 years ago. His smoking use included Cigars. He has never used smokeless tobacco. [name] reports that he does not drink alcohol.  Review of Systems Negative except as outlined.  Physical Examination Filed Vitals:   01/24/14 0852  BP: 133/75  Pulse: 81   Filed Weights   01/24/14 0848  Weight: 298 lb (135.172 kg)    Obese, appears comfortable.  HEENT: Conjunctiva and lids normal, oropharynx with moist mucosa.  Neck: Neck brace in place.  Cardiac: Irregularly irregular, no S3 gallop or pathologic systolic murmur. PMI  indistinct.  Lungs: Clear to auscultation.  Abdomen: Soft, nontender, no bruits, bowel sounds present.  Extremities: 1+ pitting edema, distal pulses 1+. Varicose veins. Soft brace on left knee.  Skin: Warm and dry.  Musculoskeletal: No gross deformities.  Neuropsychiatric: Alert and oriented x3, affect appropriate.   Problem List and Plan   Atrial fibrillation To have Coumadin clinic visit today with INR. ECG reviewed showing atrial fibrillation with PVCs versus aberrantly conducted beats. Continue current regimen.   CAD, NATIVE VESSEL Multivessel disease status post CABG as outlined. No active angina.  HYPERLIPIDEMIA-MIXED He continues on Zocor with good lipid control over time. Keep followup with Dr. Barron Alvine.  Essential hypertension, benign No change to current  regimen.    Jonelle Sidle, M.D., F.A.C.C.

## 2014-01-24 NOTE — Assessment & Plan Note (Signed)
Multivessel disease status post CABG as outlined. No active angina.

## 2014-02-07 ENCOUNTER — Ambulatory Visit (INDEPENDENT_AMBULATORY_CARE_PROVIDER_SITE_OTHER): Payer: 59 | Admitting: *Deleted

## 2014-02-07 DIAGNOSIS — I4891 Unspecified atrial fibrillation: Secondary | ICD-10-CM

## 2014-02-07 DIAGNOSIS — Z5181 Encounter for therapeutic drug level monitoring: Secondary | ICD-10-CM

## 2014-02-07 DIAGNOSIS — Z7901 Long term (current) use of anticoagulants: Secondary | ICD-10-CM

## 2014-02-07 LAB — POCT INR: INR: 3.1

## 2014-03-14 ENCOUNTER — Ambulatory Visit (INDEPENDENT_AMBULATORY_CARE_PROVIDER_SITE_OTHER): Payer: 59 | Admitting: *Deleted

## 2014-03-14 DIAGNOSIS — Z5181 Encounter for therapeutic drug level monitoring: Secondary | ICD-10-CM

## 2014-03-14 DIAGNOSIS — Z7901 Long term (current) use of anticoagulants: Secondary | ICD-10-CM

## 2014-03-14 DIAGNOSIS — I4891 Unspecified atrial fibrillation: Secondary | ICD-10-CM

## 2014-03-14 LAB — POCT INR: INR: 3

## 2014-04-03 ENCOUNTER — Other Ambulatory Visit: Payer: Self-pay | Admitting: *Deleted

## 2014-04-03 MED ORDER — WARFARIN SODIUM 5 MG PO TABS: 5.0000 mg | ORAL_TABLET | ORAL | Status: DC

## 2014-04-18 ENCOUNTER — Ambulatory Visit (INDEPENDENT_AMBULATORY_CARE_PROVIDER_SITE_OTHER): Payer: 59 | Admitting: *Deleted

## 2014-04-18 DIAGNOSIS — Z7901 Long term (current) use of anticoagulants: Secondary | ICD-10-CM

## 2014-04-18 DIAGNOSIS — Z5181 Encounter for therapeutic drug level monitoring: Secondary | ICD-10-CM

## 2014-04-18 DIAGNOSIS — I4891 Unspecified atrial fibrillation: Secondary | ICD-10-CM

## 2014-04-18 LAB — POCT INR: INR: 2.6

## 2014-05-30 ENCOUNTER — Ambulatory Visit (INDEPENDENT_AMBULATORY_CARE_PROVIDER_SITE_OTHER): Payer: 59 | Admitting: *Deleted

## 2014-05-30 DIAGNOSIS — I4891 Unspecified atrial fibrillation: Secondary | ICD-10-CM

## 2014-05-30 DIAGNOSIS — Z7901 Long term (current) use of anticoagulants: Secondary | ICD-10-CM

## 2014-05-30 DIAGNOSIS — Z5181 Encounter for therapeutic drug level monitoring: Secondary | ICD-10-CM

## 2014-05-30 LAB — POCT INR: INR: 2.4

## 2014-07-15 ENCOUNTER — Ambulatory Visit (INDEPENDENT_AMBULATORY_CARE_PROVIDER_SITE_OTHER): Payer: 59 | Admitting: *Deleted

## 2014-07-15 DIAGNOSIS — Z5181 Encounter for therapeutic drug level monitoring: Secondary | ICD-10-CM

## 2014-07-15 DIAGNOSIS — I4891 Unspecified atrial fibrillation: Secondary | ICD-10-CM

## 2014-07-15 DIAGNOSIS — Z7901 Long term (current) use of anticoagulants: Secondary | ICD-10-CM

## 2014-07-15 LAB — POCT INR: INR: 2.7

## 2014-08-05 ENCOUNTER — Telehealth: Payer: Self-pay | Admitting: Cardiology

## 2014-08-05 NOTE — Telephone Encounter (Signed)
Would like to know if will have any blood work on this coming Friday  He has an appointment with Dr Diona Browner

## 2014-08-06 ENCOUNTER — Ambulatory Visit: Payer: 59 | Admitting: Cardiology

## 2014-08-06 NOTE — Telephone Encounter (Signed)
Patient informed that he didn't have any lab orders pending.

## 2014-08-08 ENCOUNTER — Ambulatory Visit (INDEPENDENT_AMBULATORY_CARE_PROVIDER_SITE_OTHER): Payer: 59 | Admitting: *Deleted

## 2014-08-08 ENCOUNTER — Encounter: Payer: Self-pay | Admitting: Cardiology

## 2014-08-08 ENCOUNTER — Ambulatory Visit (INDEPENDENT_AMBULATORY_CARE_PROVIDER_SITE_OTHER): Payer: 59 | Admitting: Cardiology

## 2014-08-08 VITALS — BP 149/77 | HR 70 | Ht 77.0 in | Wt 297.8 lb

## 2014-08-08 DIAGNOSIS — I6529 Occlusion and stenosis of unspecified carotid artery: Secondary | ICD-10-CM

## 2014-08-08 DIAGNOSIS — I4891 Unspecified atrial fibrillation: Secondary | ICD-10-CM

## 2014-08-08 DIAGNOSIS — E785 Hyperlipidemia, unspecified: Secondary | ICD-10-CM

## 2014-08-08 DIAGNOSIS — Z7901 Long term (current) use of anticoagulants: Secondary | ICD-10-CM

## 2014-08-08 DIAGNOSIS — Z5181 Encounter for therapeutic drug level monitoring: Secondary | ICD-10-CM

## 2014-08-08 DIAGNOSIS — Z79899 Other long term (current) drug therapy: Secondary | ICD-10-CM

## 2014-08-08 DIAGNOSIS — I10 Essential (primary) hypertension: Secondary | ICD-10-CM

## 2014-08-08 LAB — POCT INR: INR: 2.7

## 2014-08-08 NOTE — Assessment & Plan Note (Signed)
No change to current regimen. 

## 2014-08-08 NOTE — Assessment & Plan Note (Signed)
Nonobstructive as of 2010. Bilateral carotid bruits noted on examination. Followup carotid Dopplers will be obtained.

## 2014-08-08 NOTE — Assessment & Plan Note (Signed)
Continue strategy of heart rate control and anticoagulation. 

## 2014-08-08 NOTE — Patient Instructions (Signed)
Your physician recommends that you schedule a follow-up appointment in: 6 months. You will receive a reminder letter in the mail in about 4 months reminding you to call and schedule your appointment. If you don't receive this letter, please contact our office. Your physician recommends that you continue on your current medications as directed. Please refer to the Current Medication list given to you today. Your physician has requested that you have a carotid duplex. This test is an ultrasound of the carotid arteries in your neck. It looks at blood flow through these arteries that supply the brain with blood. Allow one hour for this exam. There are no restrictions or special instructions. Your physician recommends that you have a FASTING lipid profile, and CMET done at the lab.

## 2014-08-08 NOTE — Assessment & Plan Note (Signed)
Continues on Zocor. Followup FLP and CMET will be obtained.

## 2014-08-08 NOTE — Progress Notes (Signed)
Clinical Summary Mr. Barkan is a 73 y.o.male last seen in March. He reports no angina symptoms, stable dyspnea on exertion. Still tries to stay as active as he can, mows his yard and does other chores.  Lipid panel from last year showed cholesterol 122, triglycerides 143, HDL 31, LDL 62. He has not had followup lab work with his primary care provider.  He reports compliance with his medications, no bleeding problems Coumadin.  I reviewed prior testing, last carotid Dopplers in 2010 showed less than ICA stenoses.   Allergies  Allergen Reactions  . Vicodin [Hydrocodone-Acetaminophen] Other (See Comments)    hallucinations    Current Outpatient Prescriptions  Medication Sig Dispense Refill  . allopurinol (ZYLOPRIM) 100 MG tablet Take 100 mg by mouth daily.        Marland Kitchen amLODipine (NORVASC) 10 MG tablet Take 1 tablet (10 mg total) by mouth daily.  90 tablet  3  . aspirin 81 MG tablet Take 81 mg by mouth daily.        Marland Kitchen lisinopril (PRINIVIL,ZESTRIL) 20 MG tablet Take 20 mg by mouth daily.        . metoprolol succinate (TOPROL-XL) 50 MG 24 hr tablet Take 1 tablet (50 mg total) by mouth daily. Take with or immediately following a meal.  90 tablet  3  . Nystatin (NYAMYC) 100000 UNIT/GM POWD as needed.       . simvastatin (ZOCOR) 10 MG tablet Take 1 tablet (10 mg total) by mouth at bedtime.  90 tablet  3  . warfarin (COUMADIN) 5 MG tablet Take 1-2 tablets (5-10 mg total) by mouth as directed. Take 2 daily except 1 daily on Monday and Friday  90 tablet  3  . [DISCONTINUED] omeprazole (PRILOSEC) 20 MG capsule Take 1 capsule by mouth Daily.       No current facility-administered medications for this visit.    Past Medical History  Diagnosis Date  . Coronary atherosclerosis of native coronary artery     Multivessel, LVEF 60%, occluded small nondominant RCA  (not bypassed)  . Atrial fibrillation   . Mixed hyperlipidemia   . Essential hypertension, benign   . Morbid obesity   . Carotid  artery disease     Nonobstructive    Past Surgical History  Procedure Laterality Date  . Coronary artery bypass graft  2004    Off-pump LIMA to LAD, SVG to diagonal    Social History Mr. Cham reports that he quit smoking about 27 years ago. His smoking use included Cigars. He has never used smokeless tobacco. Mr. Pierpoint reports that he does not drink alcohol.  Review of Systems Chronic arthritic pains, neck pain and limited range of motion as before. Intermittent headaches. Stable appetite. Other systems reviewed and negative except as outlined.  Physical Examination Filed Vitals:   08/08/14 0836  BP: 149/77  Pulse: 70   Filed Weights   08/08/14 0836  Weight: 297 lb 12.8 oz (135.081 kg)    Obese, appears comfortable.  HEENT: Conjunctiva and lids normal, oropharynx with moist mucosa. Bilateral carotid bruits.  Cardiac: Irregularly irregular, no S3 gallop or pathologic systolic murmur. PMI indistinct.  Lungs: Clear to auscultation.  Abdomen: Soft, nontender, no bruits, bowel sounds present.  Extremities: 1+ pitting edema, distal pulses 1+. Varicose veins. Skin: Warm and dry.  Musculoskeletal: No gross deformities.  Neuropsychiatric: Alert and oriented x3, affect appropriate.   Problem List and Plan   CAD, NATIVE VESSEL Symptomatically stable on medical therapy. No changes  made today.  CAROTID ARTERY DISEASE Nonobstructive as of 2010. Bilateral carotid bruits noted on examination. Followup carotid Dopplers will be obtained.  HYPERLIPIDEMIA-MIXED Continues on Zocor. Followup FLP and CMET will be obtained.  Essential hypertension, benign No change to current regimen.  Atrial fibrillation Continue strategy of heart rate control and anticoagulation.    Jonelle Sidle, M.D., F.A.C.C.

## 2014-08-08 NOTE — Assessment & Plan Note (Signed)
Symptomatically stable on medical therapy. No changes made today. 

## 2014-08-14 ENCOUNTER — Encounter (INDEPENDENT_AMBULATORY_CARE_PROVIDER_SITE_OTHER): Payer: 59

## 2014-08-14 DIAGNOSIS — I6523 Occlusion and stenosis of bilateral carotid arteries: Secondary | ICD-10-CM

## 2014-08-19 ENCOUNTER — Telehealth: Payer: Self-pay | Admitting: *Deleted

## 2014-08-19 NOTE — Telephone Encounter (Signed)
Patient informed. 

## 2014-08-19 NOTE — Telephone Encounter (Signed)
Message copied by Eustace MooreANDERSON, Sanjna Haskew M on Tue Aug 19, 2014  9:56 AM ------      Message from: Jonelle SidleMCDOWELL, SAMUEL G      Created: Fri Aug 15, 2014  3:07 PM       Reviewed. Please let him know that overall his carotids look fairly good, only1-39% bilateral ICA stenosis.continue medical therapy. ------

## 2014-08-19 NOTE — Telephone Encounter (Signed)
Message copied by Eustace MooreANDERSON, LYDIA M on Tue Aug 19, 2014  9:55 AM ------      Message from: Jonelle SidleMCDOWELL, SAMUEL G      Created: Mon Aug 18, 2014  1:02 PM       Reviewed. LFTs are normal. Renal function normal. LDL looks good at 79. No change in current regimen. ------

## 2014-08-25 ENCOUNTER — Other Ambulatory Visit: Payer: Self-pay | Admitting: Cardiology

## 2014-09-23 ENCOUNTER — Ambulatory Visit (INDEPENDENT_AMBULATORY_CARE_PROVIDER_SITE_OTHER): Payer: 59 | Admitting: *Deleted

## 2014-09-23 DIAGNOSIS — Z5181 Encounter for therapeutic drug level monitoring: Secondary | ICD-10-CM

## 2014-09-23 DIAGNOSIS — I4891 Unspecified atrial fibrillation: Secondary | ICD-10-CM

## 2014-09-23 DIAGNOSIS — Z7901 Long term (current) use of anticoagulants: Secondary | ICD-10-CM

## 2014-09-23 LAB — POCT INR: INR: 2.2

## 2014-11-10 ENCOUNTER — Telehealth: Payer: Self-pay | Admitting: *Deleted

## 2014-11-10 NOTE — Telephone Encounter (Signed)
Clifford King is having surgery on 11-19-14. Was told to go off coumdin for 5 days. Please call patient And advise him when to go back on the coumdin and he will need a follow up appointment.

## 2014-11-10 NOTE — Telephone Encounter (Signed)
Pt told to take last dose of coumadin 12/31.  He will restart coumadin 1/6 after procedure if ok with surgeon and come for INR check on 11/27/14.  He verbalized understanding.

## 2014-11-27 ENCOUNTER — Ambulatory Visit (INDEPENDENT_AMBULATORY_CARE_PROVIDER_SITE_OTHER): Payer: Medicare Other | Admitting: *Deleted

## 2014-11-27 DIAGNOSIS — Z7901 Long term (current) use of anticoagulants: Secondary | ICD-10-CM

## 2014-11-27 DIAGNOSIS — I4891 Unspecified atrial fibrillation: Secondary | ICD-10-CM

## 2014-11-27 DIAGNOSIS — Z5181 Encounter for therapeutic drug level monitoring: Secondary | ICD-10-CM

## 2014-11-27 LAB — POCT INR: INR: 1.7

## 2014-12-18 ENCOUNTER — Ambulatory Visit (INDEPENDENT_AMBULATORY_CARE_PROVIDER_SITE_OTHER): Payer: Medicare Other | Admitting: *Deleted

## 2014-12-18 DIAGNOSIS — I4891 Unspecified atrial fibrillation: Secondary | ICD-10-CM

## 2014-12-18 DIAGNOSIS — Z5181 Encounter for therapeutic drug level monitoring: Secondary | ICD-10-CM

## 2014-12-18 DIAGNOSIS — Z7901 Long term (current) use of anticoagulants: Secondary | ICD-10-CM

## 2014-12-18 LAB — POCT INR: INR: 2.2

## 2015-01-06 ENCOUNTER — Telehealth: Payer: Self-pay | Admitting: *Deleted

## 2015-01-06 MED ORDER — WARFARIN SODIUM 5 MG PO TABS: 5.0000 mg | ORAL_TABLET | ORAL | Status: DC

## 2015-01-06 NOTE — Telephone Encounter (Signed)
[facility] [place] [place] warfarin 5 mg. Medication sent to pharmacy.

## 2015-01-28 ENCOUNTER — Encounter: Payer: Self-pay | Admitting: Cardiology

## 2015-01-28 ENCOUNTER — Ambulatory Visit (INDEPENDENT_AMBULATORY_CARE_PROVIDER_SITE_OTHER): Payer: Medicare Other | Admitting: *Deleted

## 2015-01-28 ENCOUNTER — Encounter: Payer: Self-pay | Admitting: *Deleted

## 2015-01-28 ENCOUNTER — Ambulatory Visit (INDEPENDENT_AMBULATORY_CARE_PROVIDER_SITE_OTHER): Payer: Medicare Other | Admitting: Cardiology

## 2015-01-28 VITALS — BP 158/72 | HR 58 | Ht 77.0 in | Wt 286.0 lb

## 2015-01-28 DIAGNOSIS — I4891 Unspecified atrial fibrillation: Secondary | ICD-10-CM

## 2015-01-28 DIAGNOSIS — Z7901 Long term (current) use of anticoagulants: Secondary | ICD-10-CM

## 2015-01-28 DIAGNOSIS — I6523 Occlusion and stenosis of bilateral carotid arteries: Secondary | ICD-10-CM

## 2015-01-28 DIAGNOSIS — I2510 Atherosclerotic heart disease of native coronary artery without angina pectoris: Secondary | ICD-10-CM

## 2015-01-28 DIAGNOSIS — I10 Essential (primary) hypertension: Secondary | ICD-10-CM

## 2015-01-28 DIAGNOSIS — Z5181 Encounter for therapeutic drug level monitoring: Secondary | ICD-10-CM

## 2015-01-28 DIAGNOSIS — E782 Mixed hyperlipidemia: Secondary | ICD-10-CM

## 2015-01-28 LAB — POCT INR: INR: 1.9

## 2015-01-28 NOTE — Progress Notes (Signed)
Cardiology Office Note  Date: 01/28/2015   ID: Clifford ShamWilliam T Yawn, DOB 1941-07-10, MRN [id number]  PCP: Chana BodeFATADE,AYOKUNLE, DO  Primary Cardiologist: Nona DellSamuel Houa Ackert, MD   Chief Complaint  Patient presents with  . Coronary Artery Disease  . Atrial Fibrillation  . Hyperlipidemia    History of Present Illness: Clifford King is a 74 y.o. male last seen in September 2015. He presents for a routine visit. Reports no angina symptoms, but dyspnea on exertion and NYHA class II, sometimes class III. He reports compliance with his medications which are outlined below. No complaints of palpitations. He has had no falls, sometimes feels dizzy if he stands up and turns too quickly. He continues to follow in the Coumadin clinic, denies any spontaneous bleeding episodes. He is due for a follow-up PT/INR today.  Follow-up tracing is noted below. He has not had a stress test in the last 5 years.  Carotid Dopplers done in follow-up after the last visit showed only mild ICA stenoses.  Lipid status has been good overall with LDL 79 most recently on Zocor.  He has lost ten more pounds since his last encounter.   Past Medical History  Diagnosis Date  . Coronary atherosclerosis of native coronary artery     Multivessel, LVEF 60%, occluded small nondominant RCA  (not bypassed)  . Atrial fibrillation   . Mixed hyperlipidemia   . Essential hypertension, benign   . Morbid obesity   . Carotid artery disease     Nonobstructive    Past Surgical History  Procedure Laterality Date  . Coronary artery bypass graft  2004    Off-pump LIMA to LAD, SVG to diagonal    Current Outpatient Prescriptions  Medication Sig Dispense Refill  . allopurinol (ZYLOPRIM) 100 MG tablet Take 100 mg by mouth daily.      Marland Kitchen. amLODipine (NORVASC) 10 MG tablet TAKE 1 TABLET (10 MG TOTAL) BY MOUTH DAILY. 90 tablet 3  . aspirin 81 MG tablet Take 81 mg by mouth daily.      Marland Kitchen. lisinopril (PRINIVIL,ZESTRIL) 20 MG tablet Take  20 mg by mouth daily.      . metoprolol succinate (TOPROL-XL) 50 MG 24 hr tablet TAKE 1 TABLET (50 MG TOTAL) BY MOUTH DAILY. TAKE WITH OR IMMEDIATELY FOLLOWING A MEAL. 90 tablet 3  . Nystatin (NYAMYC) 100000 UNIT/GM POWD as needed.     . simvastatin (ZOCOR) 10 MG tablet TAKE 1 TABLET (10 MG TOTAL) BY MOUTH AT BEDTIME 90 tablet 3  . warfarin (COUMADIN) 5 MG tablet Take 1-2 tablets (5-10 mg total) by mouth as directed. Take 2 daily except 1 daily on Monday and Friday 90 tablet 3  . [DISCONTINUED] omeprazole (PRILOSEC) 20 MG capsule Take 1 capsule by mouth Daily.     No current facility-administered medications for this visit.    Allergies:  Vicodin   Social History: The patient  reports that he quit smoking about 28 years ago. His smoking use included Cigars. He has never used smokeless tobacco. He reports that he does not drink alcohol or use illicit drugs.   ROS:  Please see the history of present illness. Otherwise, complete review of systems is positive for back and neck pain that are chronic.  All other systems are reviewed and negative.    Physical Exam: VS:  BP 158/72 mmHg  Pulse 58  Ht 6\' 5"  (1.956 m)  Wt 286 lb (129.729 kg)  BMI 33.91 kg/m2  SpO2 98%, BMI Body mass index is  33.91 kg/(m^2).  Wt Readings from Last 3 Encounters:  01/28/15 286 lb (129.729 kg)  08/08/14 297 lb 12.8 oz (135.081 kg)  01/24/14 298 lb (135.172 kg)     Obese, appears comfortable.  HEENT: Conjunctiva and lids normal, oropharynx with moist mucosa. Bilateral carotid bruits.  Cardiac: Irregularly irregular, no S3 gallop or pathologic systolic murmur. PMI indistinct.  Lungs: Clear to auscultation.  Abdomen: Soft, nontender, no bruits, bowel sounds present.  Extremities: 1+ pitting edema, distal pulses 1+. Varicose veins. Skin: Warm and dry.  Musculoskeletal: No gross deformities.  Neuropsychiatric: Alert and oriented x3, affect appropriate.   ECG: ECG is ordered today and reviewed showing  rate-controlled atrial fibrillation with PVCs.   Recent Labwork:  Labwork from October 2015 showed BUN 13, creatinine 0.8, AST 25, ALT 25, triglycerides 112, cholesterol 134, HDL 33, LDL 79, potassium 3.8.  Other Studies Reviewed Today:  Carotid Dopplers from 08/15/2014 showed 1-39% bilateral ICA stenoses.  Assessment and Plan:  1. Two-vessel CAD status post CABG including LIMA to the LAD and SVG to the diagonal back in 2004. He has a non-grafted small nondominant RCA. We will continue medical therapy and follow-up with a Lexiscan Cardiolite (suggest 2 day protocol) to reassess ischemic burden. Unless there are substantial abnormalities, follow-up will be in 6 months.  2. Hyperlipidemia, well controlled on Zocor, recent LDL 79.  3. Mild, nonobstructive carotid artery disease.  4. Morbid obesity, he continues to work on weight loss.  5. Essential hypertension, blood pressure elevated today. Continue medical regimen, sodium restriction, further weight loss. Keep follow-up with primary care.  Current medicines are reviewed at length with the patient today.  The patient does not have concerns regarding medicines.     Orders Placed This Encounter  Procedures  . NM Myocar Multi W/Spect W/Wall Motion / EF  . Myocardial Perfusion Imaging  . EKG 12-Lead    Disposition: FU with me in 6 months.   Signed, Jonelle Sidle, MD, [facility] 01/28/2015 8:51 AM    [facility] at [facility] [address] [place], [place], [place] [address] Phone: [phone]; Fax: [phone]

## 2015-01-28 NOTE — Patient Instructions (Signed)

## 2015-02-03 ENCOUNTER — Encounter (HOSPITAL_COMMUNITY)
Admission: RE | Admit: 2015-02-03 | Discharge: 2015-02-03 | Disposition: A | Discharge status: Home / Self Care | Payer: Medicare Other | Source: Ambulatory Visit | Attending: Cardiology | Admitting: Cardiology

## 2015-02-03 ENCOUNTER — Encounter (HOSPITAL_COMMUNITY): Payer: Self-pay

## 2015-02-03 ENCOUNTER — Encounter (HOSPITAL_COMMUNITY): Payer: Medicare Other

## 2015-02-03 ENCOUNTER — Ambulatory Visit (HOSPITAL_COMMUNITY): Payer: Medicare Other

## 2015-02-03 ENCOUNTER — Ambulatory Visit (HOSPITAL_COMMUNITY)
Admission: RE | Admit: 2015-02-03 | Discharge: 2015-02-03 | Disposition: A | Discharge status: Home / Self Care | Payer: Medicare Other | Source: Ambulatory Visit | Attending: Cardiology | Admitting: Cardiology

## 2015-02-03 DIAGNOSIS — Z951 Presence of aortocoronary bypass graft: Secondary | ICD-10-CM | POA: Insufficient documentation

## 2015-02-03 DIAGNOSIS — I2510 Atherosclerotic heart disease of native coronary artery without angina pectoris: Secondary | ICD-10-CM | POA: Insufficient documentation

## 2015-02-03 DIAGNOSIS — I10 Essential (primary) hypertension: Secondary | ICD-10-CM | POA: Insufficient documentation

## 2015-02-03 MED ORDER — TECHNETIUM TC 99M SESTAMIBI GENERIC - CARDIOLITE
30.0000 | Freq: Once | INTRAVENOUS | Status: AC | PRN
  Administered 2015-02-03: 30 via INTRAVENOUS

## 2015-02-03 MED ORDER — SODIUM CHLORIDE 0.9 % IJ SOLN
INTRAMUSCULAR | Status: AC
  Administered 2015-02-03: 10 mL via INTRAVENOUS
  Filled 2015-02-03: qty 3

## 2015-02-03 MED ORDER — REGADENOSON 0.4 MG/5ML IV SOLN
0.4000 mg | Freq: Once | INTRAVENOUS | Status: AC | PRN
  Administered 2015-02-03: 0.4 mg via INTRAVENOUS

## 2015-02-03 MED ORDER — REGADENOSON 0.4 MG/5ML IV SOLN
INTRAVENOUS | Status: AC
  Administered 2015-02-03: 0.4 mg via INTRAVENOUS
  Filled 2015-02-03: qty 5

## 2015-02-03 MED ORDER — SODIUM CHLORIDE 0.9 % IJ SOLN
10.0000 mL | INTRAMUSCULAR | Status: DC | PRN
Start: 2015-02-03 — End: 2015-02-04
  Administered 2015-02-03: 10 mL via INTRAVENOUS
  Filled 2015-02-03: qty 10

## 2015-02-03 NOTE — Progress Notes (Addendum)
Stress Lab Nurses Notes - Clifford King  Clifford King 02/03/2015 Reason for doing test: CAD and Physical Type of test: Steffanie DunnLexiscan Myoview / 2-Day Study Nurse performing test: Clifford PoissonPhyllis Billingsly, RN Nuclear Medicine Tech: Clifford King Echo Tech: Not Applicable MD performing test: Clifford BishopLawrence NP Family [facility]: [facility] Test explained and consent signed: Yes.   IV started: Saline lock flushed, No redness or edema and Saline lock started in radiology Symptoms: Chest discomfort & SOB Treatment/Intervention: None Reason test stopped: protocol completed After recovery IV was: Discontinued via X-ray tech and No redness or edema Patient to return to Nuc. Med at : 10:00 Patient discharged: Home Patient's Condition upon discharge was: stable Comments: During test BP 128/57 & HR 80.  Recovery BP 127/58 & HR 71.  Symptoms resolved in recovery. Clifford King, Clifford King

## 2015-02-04 ENCOUNTER — Encounter (HOSPITAL_COMMUNITY)
Admission: RE | Admit: 2015-02-04 | Discharge: 2015-02-04 | Disposition: A | Discharge status: Home / Self Care | Payer: Medicare Other | Source: Ambulatory Visit | Attending: Cardiology | Admitting: Cardiology

## 2015-02-04 DIAGNOSIS — Z951 Presence of aortocoronary bypass graft: Secondary | ICD-10-CM | POA: Diagnosis not present

## 2015-02-04 DIAGNOSIS — I10 Essential (primary) hypertension: Secondary | ICD-10-CM | POA: Diagnosis not present

## 2015-02-04 DIAGNOSIS — I2510 Atherosclerotic heart disease of native coronary artery without angina pectoris: Secondary | ICD-10-CM | POA: Diagnosis not present

## 2015-02-04 MED ORDER — TECHNETIUM TC 99M SESTAMIBI GENERIC - CARDIOLITE
20.0000 | Freq: Once | INTRAVENOUS | Status: AC | PRN
  Administered 2015-02-04: 20 via INTRAVENOUS

## 2015-02-06 ENCOUNTER — Telehealth: Payer: Self-pay | Admitting: *Deleted

## 2015-02-06 NOTE — Telephone Encounter (Signed)
-----   Message from Jonelle SidleSamuel G McDowell, MD sent at 02/04/2015  4:01 PM EDT ----- Reviewed report. Overall this is a low risk test result with normal LVEF and evidence of inferior scar with perhaps mild peri-infarct ischemia that would be consistent with his coronary anatomy. Would continue medical therapy and follow up as arranged.

## 2015-02-06 NOTE — Telephone Encounter (Signed)
Patient informed. 

## 2015-02-26 ENCOUNTER — Ambulatory Visit (INDEPENDENT_AMBULATORY_CARE_PROVIDER_SITE_OTHER): Payer: 59 | Admitting: *Deleted

## 2015-02-26 DIAGNOSIS — Z7901 Long term (current) use of anticoagulants: Secondary | ICD-10-CM | POA: Diagnosis not present

## 2015-02-26 DIAGNOSIS — Z5181 Encounter for therapeutic drug level monitoring: Secondary | ICD-10-CM

## 2015-02-26 DIAGNOSIS — I480 Paroxysmal atrial fibrillation: Secondary | ICD-10-CM

## 2015-02-26 DIAGNOSIS — I4891 Unspecified atrial fibrillation: Secondary | ICD-10-CM | POA: Diagnosis not present

## 2015-02-26 LAB — POCT INR
INR: 2.9
INR: 2.9

## 2015-03-26 ENCOUNTER — Ambulatory Visit (INDEPENDENT_AMBULATORY_CARE_PROVIDER_SITE_OTHER): Payer: 59 | Admitting: *Deleted

## 2015-03-26 DIAGNOSIS — Z7901 Long term (current) use of anticoagulants: Secondary | ICD-10-CM

## 2015-03-26 DIAGNOSIS — Z5181 Encounter for therapeutic drug level monitoring: Secondary | ICD-10-CM

## 2015-03-26 DIAGNOSIS — I4891 Unspecified atrial fibrillation: Secondary | ICD-10-CM

## 2015-03-26 LAB — POCT INR: INR: 2.8

## 2015-04-30 ENCOUNTER — Ambulatory Visit (INDEPENDENT_AMBULATORY_CARE_PROVIDER_SITE_OTHER): Payer: Medicare Other | Admitting: *Deleted

## 2015-04-30 DIAGNOSIS — I4891 Unspecified atrial fibrillation: Secondary | ICD-10-CM | POA: Diagnosis not present

## 2015-04-30 DIAGNOSIS — Z5181 Encounter for therapeutic drug level monitoring: Secondary | ICD-10-CM

## 2015-04-30 DIAGNOSIS — Z7901 Long term (current) use of anticoagulants: Secondary | ICD-10-CM

## 2015-04-30 LAB — POCT INR: INR: 2.5

## 2015-06-02 ENCOUNTER — Other Ambulatory Visit: Payer: Self-pay | Admitting: Cardiology

## 2015-06-02 MED ORDER — WARFARIN SODIUM 5 MG PO TABS: 5.0000 mg | ORAL_TABLET | ORAL | Status: DC

## 2015-06-02 NOTE — Telephone Encounter (Signed)
Medication sent to pharmacy  

## 2015-06-02 NOTE — Telephone Encounter (Signed)
warfarin (COUMADIN) 5 MG tablet - patient is almost out  [facility] [place]

## 2015-06-11 ENCOUNTER — Ambulatory Visit (INDEPENDENT_AMBULATORY_CARE_PROVIDER_SITE_OTHER): Payer: Medicare Other | Admitting: *Deleted

## 2015-06-11 DIAGNOSIS — Z5181 Encounter for therapeutic drug level monitoring: Secondary | ICD-10-CM

## 2015-06-11 DIAGNOSIS — Z7901 Long term (current) use of anticoagulants: Secondary | ICD-10-CM

## 2015-06-11 DIAGNOSIS — I4891 Unspecified atrial fibrillation: Secondary | ICD-10-CM

## 2015-06-11 LAB — POCT INR: INR: 3.3

## 2015-07-21 ENCOUNTER — Ambulatory Visit: Payer: Medicare Other | Admitting: Cardiology

## 2015-07-23 ENCOUNTER — Ambulatory Visit: Payer: Medicare Other | Admitting: Cardiology

## 2015-08-04 ENCOUNTER — Ambulatory Visit (INDEPENDENT_AMBULATORY_CARE_PROVIDER_SITE_OTHER): Payer: Medicare Other | Admitting: Cardiology

## 2015-08-04 ENCOUNTER — Encounter: Payer: Self-pay | Admitting: Cardiology

## 2015-08-04 ENCOUNTER — Ambulatory Visit (INDEPENDENT_AMBULATORY_CARE_PROVIDER_SITE_OTHER): Payer: Medicare Other | Admitting: *Deleted

## 2015-08-04 VITALS — BP 123/66 | HR 69 | Ht 77.0 in | Wt 286.0 lb

## 2015-08-04 DIAGNOSIS — Z7901 Long term (current) use of anticoagulants: Secondary | ICD-10-CM

## 2015-08-04 DIAGNOSIS — I482 Chronic atrial fibrillation, unspecified: Secondary | ICD-10-CM

## 2015-08-04 DIAGNOSIS — I10 Essential (primary) hypertension: Secondary | ICD-10-CM

## 2015-08-04 DIAGNOSIS — I2510 Atherosclerotic heart disease of native coronary artery without angina pectoris: Secondary | ICD-10-CM | POA: Diagnosis not present

## 2015-08-04 DIAGNOSIS — I4891 Unspecified atrial fibrillation: Secondary | ICD-10-CM

## 2015-08-04 DIAGNOSIS — Z5181 Encounter for therapeutic drug level monitoring: Secondary | ICD-10-CM

## 2015-08-04 LAB — POCT INR: INR: 2.6

## 2015-08-04 NOTE — Progress Notes (Signed)
Cardiology Office Note  Date: 08/04/2015   ID: Clifford King, DOB 04-Nov-1941, MRN [id number]  PCP: Chana Bode DO  Primary Cardiologist: Nona Dell, MD   Chief Complaint  Patient presents with  . Coronary Artery Disease  . Atrial Fibrillation    History of Present Illness: Clifford King is a 74 y.o. male last seen in March. He presents today for a routine follow-up visit. He has been able to maintain a better weight, still trying to lose more pounds. Seems to be watching his diet fairly carefully, walking for exercise as tolerated. He does not endorse any angina symptoms.  Follow-up ischemic testing from March is outlined below. He had evidence of inferior scar with mild peri-infarct ischemia which would be consistent with his known coronary anatomy including a non-bypassed, occluded small nondominant RCA. LVEF was 58%.  He continues on Coumadin with history of atrial fibrillation. No reported bleeding problems.   Past Medical History  Diagnosis Date  . Coronary atherosclerosis of native coronary artery     Multivessel, LVEF 60%, occluded small nondominant RCA  (not bypassed)  . Atrial fibrillation   . Mixed hyperlipidemia   . Essential hypertension, benign   . Morbid obesity   . Carotid artery disease     Nonobstructive    Past Surgical History  Procedure Laterality Date  . Coronary artery bypass graft  2004    Off-pump LIMA to LAD, SVG to diagonal    Current Outpatient Prescriptions  Medication Sig Dispense Refill  . allopurinol (ZYLOPRIM) 100 MG tablet Take 100 mg by mouth daily.      Marland Kitchen amLODipine (NORVASC) 10 MG tablet TAKE 1 TABLET (10 MG TOTAL) BY MOUTH DAILY. 90 tablet 3  . aspirin 81 MG tablet Take 81 mg by mouth daily.      Marland Kitchen lisinopril (PRINIVIL,ZESTRIL) 20 MG tablet Take 20 mg by mouth daily.      . metoprolol succinate (TOPROL-XL) 50 MG 24 hr tablet TAKE 1 TABLET (50 MG TOTAL) BY MOUTH DAILY. TAKE WITH OR IMMEDIATELY FOLLOWING A MEAL.  90 tablet 3  . Nystatin (NYAMYC) 100000 UNIT/GM POWD as needed.     . simvastatin (ZOCOR) 10 MG tablet TAKE 1 TABLET (10 MG TOTAL) BY MOUTH AT BEDTIME 90 tablet 3  . warfarin (COUMADIN) 5 MG tablet Take 1-2 tablets (5-10 mg total) by mouth as directed. Take 2 daily except 1 daily on Monday and Friday 90 tablet 3  . [DISCONTINUED] omeprazole (PRILOSEC) 20 MG capsule Take 1 capsule by mouth Daily.     No current facility-administered medications for this visit.    Allergies:  Vicodin   Social History: The patient  reports that he quit smoking about 28 years ago. His smoking use included Cigars. He has never used smokeless tobacco. He reports that he does not drink alcohol or use illicit drugs.   ROS:  Please see the history of present illness. Otherwise, complete review of systems is positive for chronic neck pain and stiffness after prior surgery.  All other systems are reviewed and negative.   Physical Exam: VS:  BP 123/66 mmHg  Pulse 69  Ht  (1.956 m)  Wt 286 lb (129.729 kg)  BMI 33.91 kg/m2  SpO2 98%, BMI Body mass index is 33.91 kg/(m^2).  Wt Readings from Last 3 Encounters:  08/04/15 286 lb (129.729 kg)  01/28/15 286 lb (129.729 kg)  08/08/14 297 lb 12.8 oz (135.081 kg)     Obese, appears comfortable.  HEENT: Conjunctiva  and lids normal, oropharynx with moist mucosa. Bilateral carotid bruits.  Cardiac: Irregularly irregular, no S3 gallop or pathologic systolic murmur. PMI indistinct.  Lungs: Clear to auscultation.  Abdomen: Soft, nontender, no bruits, bowel sounds present.  Extremities: 1+ pitting edema, distal pulses 1+. Varicose veins. Skin: Warm and dry.  Musculoskeletal: No gross deformities.  Neuropsychiatric: Alert and oriented x3, affect appropriate.   ECG: ECG is not ordered today.  Other Studies Reviewed Today:  Lexiscan Cardiolite 02/03/2015: FINDINGS: Stress data: The patient was stressed according to the Lexiscan protocol. The resting heart  rate of 70 beats per min rose to a maximal heart rate of 87 beats per min. The blood pressure averaged 136/66. No chest pain was reported. Baseline ECG demonstrated sinus rhythm. With stress, frequent PVCs were noted. There were no ischemic ST segment or T-wave abnormalities. PACs were also seen. Both PVCs and PACs persisted into recovery. No chest pain was reported.  Perfusion: There is a moderate sized inferior wall defect with mild to moderately decreased uptake on stress images with partial reversibility on the resting images, extending from the apex to the base. This is suggestive of inferior wall scar with a mild degree of peri-infarct ischemia. However, overlapping soft tissue attenuation artifact cannot entirely be ruled out. Anterior wall soft tissue attenuation artifact is also noted.  Wall Motion: Abnormal septal motion consistent with prior thoracotomy. Possible mild degree of inferior wall hypokinesis. No left ventricular dilation.  Left Ventricular Ejection Fraction: 58 %  End diastolic volume 137 ml  End systolic volume 58 ml  IMPRESSION: 1. Inferior wall scar with possible mild peri-infarct ischemia. However, overlapping soft tissue attenuation artifact cannot entirely be ruled out .  2. Abnormal septal motion consistent with prior thoracotomy. Possible mild degree of inferior wall hypokinesis.  3. Left ventricular ejection fraction 58%  4. Low to intermediate-risk stress test findings*.  Assessment and Plan:  1. Multivessel CAD status post CABG. He reports no angina symptoms, has stable dyspnea on exertion. I encouraged continued efforts at diet and exercise.  2. Chronic atrial fibrillation, heart rate controlled and on Coumadin for stroke prophylaxis.  3. Essential hypertension, blood pressure is well controlled today.  Current medicines were reviewed with the patient today.   Disposition: FU with me in 6 months.   Signed, Jonelle Sidle, MD, [facility] 08/04/2015 1:52 PM    [facility] at [facility] [address] [place], [place], [place] [address] Phone: [phone]; Fax: [phone]

## 2015-08-04 NOTE — Patient Instructions (Signed)
Your physician recommends that you continue on your current medications as directed. Please refer to the Current Medication list given to you today. Your physician recommends that you schedule a follow-up appointment in: 6 months. You will receive a reminder letter in the mail in about 4 months reminding you to call and schedule your appointment. If you don't receive this letter, please contact our office. 

## 2015-08-21 ENCOUNTER — Other Ambulatory Visit: Payer: Self-pay | Admitting: *Deleted

## 2015-08-21 MED ORDER — METOPROLOL SUCCINATE ER 50 MG PO TB24: 50.0000 mg | ORAL_TABLET | Freq: Every day | ORAL | Status: DC

## 2015-08-27 ENCOUNTER — Other Ambulatory Visit: Payer: Self-pay | Admitting: *Deleted

## 2015-08-27 MED ORDER — AMLODIPINE BESYLATE 10 MG PO TABS: 10.0000 mg | ORAL_TABLET | Freq: Every day | ORAL | Status: DC

## 2015-09-08 ENCOUNTER — Ambulatory Visit (INDEPENDENT_AMBULATORY_CARE_PROVIDER_SITE_OTHER): Payer: Medicare Other | Admitting: *Deleted

## 2015-09-08 DIAGNOSIS — Z7901 Long term (current) use of anticoagulants: Secondary | ICD-10-CM

## 2015-09-08 DIAGNOSIS — I4891 Unspecified atrial fibrillation: Secondary | ICD-10-CM | POA: Diagnosis not present

## 2015-09-08 DIAGNOSIS — Z5181 Encounter for therapeutic drug level monitoring: Secondary | ICD-10-CM | POA: Diagnosis not present

## 2015-09-08 LAB — POCT INR: INR: 3

## 2015-09-14 ENCOUNTER — Other Ambulatory Visit: Payer: Self-pay

## 2015-09-14 ENCOUNTER — Other Ambulatory Visit: Payer: Self-pay | Admitting: *Deleted

## 2015-09-14 MED ORDER — SIMVASTATIN 10 MG PO TABS: ORAL_TABLET | ORAL | Status: DC

## 2015-10-20 ENCOUNTER — Ambulatory Visit (INDEPENDENT_AMBULATORY_CARE_PROVIDER_SITE_OTHER): Payer: Medicare Other | Admitting: *Deleted

## 2015-10-20 DIAGNOSIS — Z5181 Encounter for therapeutic drug level monitoring: Secondary | ICD-10-CM

## 2015-10-20 DIAGNOSIS — I4891 Unspecified atrial fibrillation: Secondary | ICD-10-CM

## 2015-10-20 DIAGNOSIS — Z7901 Long term (current) use of anticoagulants: Secondary | ICD-10-CM | POA: Diagnosis not present

## 2015-10-20 LAB — POCT INR: INR: 2.2

## 2015-12-01 ENCOUNTER — Ambulatory Visit (INDEPENDENT_AMBULATORY_CARE_PROVIDER_SITE_OTHER): Payer: Medicare Other | Admitting: Pharmacist

## 2015-12-01 DIAGNOSIS — Z5181 Encounter for therapeutic drug level monitoring: Secondary | ICD-10-CM

## 2015-12-01 DIAGNOSIS — I4891 Unspecified atrial fibrillation: Secondary | ICD-10-CM

## 2015-12-01 DIAGNOSIS — Z7901 Long term (current) use of anticoagulants: Secondary | ICD-10-CM | POA: Diagnosis not present

## 2015-12-01 LAB — POCT INR: INR: 2.7

## 2016-01-12 ENCOUNTER — Ambulatory Visit (INDEPENDENT_AMBULATORY_CARE_PROVIDER_SITE_OTHER): Payer: Medicare Other | Admitting: *Deleted

## 2016-01-12 DIAGNOSIS — I4891 Unspecified atrial fibrillation: Secondary | ICD-10-CM | POA: Diagnosis not present

## 2016-01-12 DIAGNOSIS — Z5181 Encounter for therapeutic drug level monitoring: Secondary | ICD-10-CM

## 2016-01-12 DIAGNOSIS — Z7901 Long term (current) use of anticoagulants: Secondary | ICD-10-CM | POA: Diagnosis not present

## 2016-01-12 LAB — POCT INR: INR: 2.8

## 2016-02-09 ENCOUNTER — Ambulatory Visit (INDEPENDENT_AMBULATORY_CARE_PROVIDER_SITE_OTHER): Payer: Medicare Other | Admitting: Cardiology

## 2016-02-09 ENCOUNTER — Encounter: Payer: Self-pay | Admitting: Cardiology

## 2016-02-09 VITALS — BP 146/72 | HR 55 | Ht 77.0 in | Wt 279.6 lb

## 2016-02-09 DIAGNOSIS — I2510 Atherosclerotic heart disease of native coronary artery without angina pectoris: Secondary | ICD-10-CM

## 2016-02-09 DIAGNOSIS — I10 Essential (primary) hypertension: Secondary | ICD-10-CM

## 2016-02-09 DIAGNOSIS — I482 Chronic atrial fibrillation, unspecified: Secondary | ICD-10-CM

## 2016-02-09 NOTE — Progress Notes (Signed)
Cardiology Office Note  Date: 02/09/2016   ID: Clifford King, DOB 02-Oct-1941, MRN [id number]  PCP: Chana Bode DO  Primary Cardiologist: Nona Dell, MD   Chief Complaint  Patient presents with  . Coronary Artery Disease  . Atrial Fibrillation    History of Present Illness: Clifford King is a 75 y.o. male last seen in September 2016. He presents for a routine follow-up visit. Reports chronic dyspnea on exertion without change in intensity, no angina symptoms on medical therapy.  He continues on Coumadin with follow-up in the anticoagulation clinic. Last INR was 2.8. He reports no bleeding problems.  Cardiac regimen reviewed and also includes Norvasc, lisinopril, Toprol-XL, and Zocor.  Past Medical History  Diagnosis Date  . Coronary atherosclerosis of native coronary artery     Multivessel, LVEF 60%, occluded small nondominant RCA  (not bypassed)  . Atrial fibrillation (HCC)   . Mixed hyperlipidemia   . Essential hypertension, benign   . Morbid obesity (HCC)   . Carotid artery disease (HCC)     Nonobstructive    Past Surgical History  Procedure Laterality Date  . Coronary artery bypass graft  2004    Off-pump LIMA to LAD, SVG to diagonal    Current Outpatient Prescriptions  Medication Sig Dispense Refill  . allopurinol (ZYLOPRIM) 100 MG tablet Take 100 mg by mouth daily.      Marland Kitchen amLODipine (NORVASC) 10 MG tablet Take 1 tablet (10 mg total) by mouth daily. 90 tablet 3  . aspirin 81 MG tablet Take 81 mg by mouth daily.      Marland Kitchen lisinopril (PRINIVIL,ZESTRIL) 20 MG tablet Take 20 mg by mouth daily.      . metoprolol succinate (TOPROL-XL) 50 MG 24 hr tablet Take 1 tablet (50 mg total) by mouth daily. Take with or immediately following a meal. 90 tablet 3  . Nystatin (NYAMYC) 100000 UNIT/GM POWD as needed.     . simvastatin (ZOCOR) 10 MG tablet TAKE 1 TABLET (10 MG TOTAL) BY MOUTH AT BEDTIME 90 tablet 1  . warfarin (COUMADIN) 5 MG tablet Take 1-2 tablets  (5-10 mg total) by mouth as directed. Take 2 daily except 1 daily on Monday and Friday 90 tablet 3  . [DISCONTINUED] omeprazole (PRILOSEC) 20 MG capsule Take 1 capsule by mouth Daily.     No current facility-administered medications for this visit.   Allergies:  Vicodin   Social History: The patient  reports that he quit smoking about 29 years ago. His smoking use included Cigars. He has never used smokeless tobacco. He reports that he does not drink alcohol or use illicit drugs.   ROS:  Please see the history of present illness. Otherwise, complete review of systems is positive for chronic neck pain with limited range of motion.  All other systems are reviewed and negative.   Physical Exam: VS:  BP 146/72 mmHg  Pulse 55  Ht  (1.956 m)  Wt 279 lb 9.6 oz (126.826 kg)  BMI 33.15 kg/m2  SpO2 97%, BMI Body mass index is 33.15 kg/(m^2).  Wt Readings from Last 3 Encounters:  02/09/16 279 lb 9.6 oz (126.826 kg)  08/04/15 286 lb (129.729 kg)  01/28/15 286 lb (129.729 kg)    General: Patient appears comfortable at rest. HEENT: Conjunctiva and lids normal, oropharynx clear with moist mucosa. Neck: Supple, no elevated JVP or carotid bruits, no thyromegaly. Lungs: Clear to auscultation, nonlabored breathing at rest. Cardiac: Regular rate and rhythm, no S3 or significant systolic  murmur, no pericardial rub. Abdomen: Soft, nontender, no hepatomegaly, bowel sounds present, no guarding or rebound. Extremities: No pitting edema, distal pulses 2+.  ECG: I personally reviewed the prior tracing from 01/28/2015 which showed atrial fibrillation with PVCs and borderline low voltage.  Recent Labwork:    Component Value Date/Time   CHOL 122 08/20/2013 0859   TRIG 143 08/20/2013 0859   HDL 31* 08/20/2013 0859   CHOLHDL 3.9 08/20/2013 0859   VLDL 29 08/20/2013 0859   LDLCALC 62 08/20/2013 0859    Other Studies Reviewed Today:  Eugenie BirksLexiscan Cardiolite 02/03/2015: FINDINGS: Stress data: The  patient was stressed according to the Lexiscan protocol. The resting heart rate of 70 beats per min rose to a maximal heart rate of 87 beats per min. The blood pressure averaged 136/66. No chest pain was reported. Baseline ECG demonstrated sinus rhythm. With stress, frequent PVCs were noted. There were no ischemic ST segment or T-wave abnormalities. PACs were also seen. Both PVCs and PACs persisted into recovery. No chest pain was reported.  Perfusion: There is a moderate sized inferior wall defect with mild to moderately decreased uptake on stress images with partial reversibility on the resting images, extending from the apex to the base. This is suggestive of inferior wall scar with a mild degree of peri-infarct ischemia. However, overlapping soft tissue attenuation artifact cannot entirely be ruled out. Anterior wall soft tissue attenuation artifact is also noted.  Wall Motion: Abnormal septal motion consistent with prior thoracotomy. Possible mild degree of inferior wall hypokinesis. No left ventricular dilation.  Left Ventricular Ejection Fraction: 58 %  End diastolic volume 137 ml  End systolic volume 58 ml  IMPRESSION: 1. Inferior wall scar with possible mild peri-infarct ischemia. However, overlapping soft tissue attenuation artifact cannot entirely be ruled out .  2. Abnormal septal motion consistent with prior thoracotomy. Possible mild degree of inferior wall hypokinesis.  3. Left ventricular ejection fraction 58%  4. Low to intermediate-risk stress test findings*.  Assessment and Plan:  1. Multivessel CAD status post CABG with LIMA to the LAD and SVG to diagonal. He remains clinically stable without active angina and chronic dyspnea exertion. No changes made to current regimen.  2. Essential hypertension, no changes to current antihypertensive regimen. Continue to encourage weight loss.  3. Chronic atrial fibrillation, subsequent medically  controlled on beta blocker and on Coumadin for stroke prophylaxis.  Current medicines were reviewed with the patient today.   Orders Placed This Encounter  Procedures  . EKG 12-Lead    Disposition: FU with me in 6 months.   Signed, Jonelle SidleSamuel G. McDowell, MD, [facility] 02/09/2016 2:54 PM    [facility] [address], [place], [place] [address] Phone: [phone]; Fax: [phone]

## 2016-02-09 NOTE — Patient Instructions (Signed)
Your physician recommends that you continue on your current medications as directed. Please refer to the Current Medication list given to you today. Your physician recommends that you schedule a follow-up appointment in: 6 months. You will receive a reminder letter in the mail in about 4 months reminding you to call and schedule your appointment. If you don't receive this letter, please contact our office. 

## 2016-02-23 ENCOUNTER — Ambulatory Visit (INDEPENDENT_AMBULATORY_CARE_PROVIDER_SITE_OTHER): Payer: Medicare Other | Admitting: *Deleted

## 2016-02-23 DIAGNOSIS — I4891 Unspecified atrial fibrillation: Secondary | ICD-10-CM | POA: Diagnosis not present

## 2016-02-23 DIAGNOSIS — Z7901 Long term (current) use of anticoagulants: Secondary | ICD-10-CM | POA: Diagnosis not present

## 2016-02-23 DIAGNOSIS — Z5181 Encounter for therapeutic drug level monitoring: Secondary | ICD-10-CM | POA: Diagnosis not present

## 2016-02-23 LAB — POCT INR: INR: 3.4

## 2016-04-05 ENCOUNTER — Ambulatory Visit (INDEPENDENT_AMBULATORY_CARE_PROVIDER_SITE_OTHER): Payer: Medicare Other | Admitting: *Deleted

## 2016-04-05 DIAGNOSIS — Z7901 Long term (current) use of anticoagulants: Secondary | ICD-10-CM

## 2016-04-05 DIAGNOSIS — Z5181 Encounter for therapeutic drug level monitoring: Secondary | ICD-10-CM

## 2016-04-05 DIAGNOSIS — I4891 Unspecified atrial fibrillation: Secondary | ICD-10-CM

## 2016-04-05 LAB — POCT INR: INR: 3.2

## 2016-05-06 ENCOUNTER — Other Ambulatory Visit: Payer: Self-pay | Admitting: Cardiology

## 2016-05-19 ENCOUNTER — Ambulatory Visit (INDEPENDENT_AMBULATORY_CARE_PROVIDER_SITE_OTHER): Payer: Medicare Other | Admitting: *Deleted

## 2016-05-19 DIAGNOSIS — I4891 Unspecified atrial fibrillation: Secondary | ICD-10-CM | POA: Diagnosis not present

## 2016-05-19 DIAGNOSIS — Z5181 Encounter for therapeutic drug level monitoring: Secondary | ICD-10-CM

## 2016-05-19 DIAGNOSIS — Z7901 Long term (current) use of anticoagulants: Secondary | ICD-10-CM | POA: Diagnosis not present

## 2016-05-19 LAB — POCT INR: INR: 2

## 2016-05-25 ENCOUNTER — Other Ambulatory Visit: Payer: Self-pay | Admitting: Cardiology

## 2016-06-28 ENCOUNTER — Other Ambulatory Visit: Payer: Self-pay | Admitting: Cardiology

## 2016-06-30 ENCOUNTER — Ambulatory Visit (INDEPENDENT_AMBULATORY_CARE_PROVIDER_SITE_OTHER): Payer: Medicare Other | Admitting: *Deleted

## 2016-06-30 DIAGNOSIS — Z5181 Encounter for therapeutic drug level monitoring: Secondary | ICD-10-CM | POA: Diagnosis not present

## 2016-06-30 DIAGNOSIS — I4891 Unspecified atrial fibrillation: Secondary | ICD-10-CM | POA: Diagnosis not present

## 2016-06-30 DIAGNOSIS — Z7901 Long term (current) use of anticoagulants: Secondary | ICD-10-CM | POA: Diagnosis not present

## 2016-06-30 LAB — POCT INR: INR: 2.2

## 2016-07-28 ENCOUNTER — Other Ambulatory Visit: Payer: Self-pay | Admitting: Cardiology

## 2016-08-04 ENCOUNTER — Other Ambulatory Visit: Payer: Self-pay | Admitting: Cardiology

## 2016-08-04 DIAGNOSIS — I6523 Occlusion and stenosis of bilateral carotid arteries: Secondary | ICD-10-CM

## 2016-08-08 ENCOUNTER — Encounter: Payer: Self-pay | Admitting: *Deleted

## 2016-08-09 ENCOUNTER — Ambulatory Visit (INDEPENDENT_AMBULATORY_CARE_PROVIDER_SITE_OTHER): Payer: Medicare Other | Admitting: *Deleted

## 2016-08-09 ENCOUNTER — Encounter: Payer: Self-pay | Admitting: Cardiology

## 2016-08-09 ENCOUNTER — Ambulatory Visit (INDEPENDENT_AMBULATORY_CARE_PROVIDER_SITE_OTHER): Payer: Medicare Other | Admitting: Cardiology

## 2016-08-09 VITALS — BP 138/78 | HR 66 | Ht 77.0 in | Wt 281.0 lb

## 2016-08-09 DIAGNOSIS — Z7901 Long term (current) use of anticoagulants: Secondary | ICD-10-CM

## 2016-08-09 DIAGNOSIS — E782 Mixed hyperlipidemia: Secondary | ICD-10-CM

## 2016-08-09 DIAGNOSIS — I2510 Atherosclerotic heart disease of native coronary artery without angina pectoris: Secondary | ICD-10-CM | POA: Diagnosis not present

## 2016-08-09 DIAGNOSIS — I6523 Occlusion and stenosis of bilateral carotid arteries: Secondary | ICD-10-CM

## 2016-08-09 DIAGNOSIS — I482 Chronic atrial fibrillation, unspecified: Secondary | ICD-10-CM

## 2016-08-09 DIAGNOSIS — I4891 Unspecified atrial fibrillation: Secondary | ICD-10-CM | POA: Diagnosis not present

## 2016-08-09 DIAGNOSIS — Z5181 Encounter for therapeutic drug level monitoring: Secondary | ICD-10-CM

## 2016-08-09 LAB — POCT INR: INR: 2.1

## 2016-08-09 NOTE — Patient Instructions (Signed)

## 2016-08-09 NOTE — Progress Notes (Signed)
Cardiology Office Note  Date: 08/09/2016   ID: Clifford King, DOB 11/26/1940, MRN [id number]  PCP: Chana Bode DO  Primary Cardiologist: Nona Dell, MD   Chief Complaint  Patient presents with  . PAF  . Coronary Artery Disease    History of Present Illness: Clifford King is a 75 y.o. male last seen in March. He presents with his wife for a follow-up visit. Reports no significant palpitations. He does have stable angina symptoms at higher levels of activity such as push-mowing his yard. Not using nitroglycerin.  He continues on Coumadin with follow-up in the anticoagulation clinic. No reported bleeding episodes.  I reviewed his medications which are outlined below. He underwent stress testing last year, results outlined below.  Past Medical History:  Diagnosis Date  . Atrial fibrillation (HCC)   . Carotid artery disease (HCC)    Nonobstructive  . Coronary atherosclerosis of native coronary artery    Multivessel, LVEF 60%, occluded small nondominant RCA  (not bypassed)  . Essential hypertension, benign   . Mixed hyperlipidemia   . Morbid obesity (HCC)     Past Surgical History:  Procedure Laterality Date  . CORONARY ARTERY BYPASS GRAFT  2004   Off-pump LIMA to LAD, SVG to diagonal    Current Outpatient Prescriptions  Medication Sig Dispense Refill  . allopurinol (ZYLOPRIM) 100 MG tablet Take 100 mg by mouth daily.      Marland Kitchen amLODipine (NORVASC) 10 MG tablet Take 1 tablet (10 mg total) by mouth daily. 90 tablet 3  . aspirin 81 MG tablet Take 81 mg by mouth daily.      Marland Kitchen lisinopril (PRINIVIL,ZESTRIL) 20 MG tablet Take 20 mg by mouth daily.      . metoprolol succinate (TOPROL-XL) 50 MG 24 hr tablet TAKE 1 TABLET BY MOUTH DAILY WITH OR IMMEDIATELY FOLLOWING A MEAL 90 tablet 0  . Nystatin (NYAMYC) 100000 UNIT/GM POWD as needed.     . simvastatin (ZOCOR) 10 MG tablet TAKE 1 TABLET BY MOUTH AT BEDTIME 90 tablet 3  . warfarin (COUMADIN) 5 MG tablet TAKE 2  TABLETS BY MOUTH DAILY EXCEPT ON DAYS MONDAYS AND FRIDAYS 90 tablet 3   No current facility-administered medications for this visit.    Allergies:  Vicodin [hydrocodone-acetaminophen]   Social History: The patient  reports that he quit smoking about 29 years ago. His smoking use included Cigars. He has a 5.00 pack-year smoking history. He has never used smokeless tobacco. He reports that he does not drink alcohol or use drugs.   ROS:  Please see the history of present illness. 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  All other systems are reviewed and negative.   Physical Exam: VS:  BP 138/78   Pulse 66   Ht 6\' 5"  (1.956 m)   Wt 281 lb (127.5 kg)   SpO2 98%   BMI 33.32 kg/m , BMI Body mass index is 33.32 kg/m.  Wt Readings from Last 3 Encounters:  08/09/16 281 lb (127.5 kg)  02/09/16 279 lb 9.6 oz (126.8 kg)  08/04/15 286 lb (129.7 kg)    General: Patient appears comfortable at rest. HEENT: Conjunctiva and lids normal, oropharynx clear with moist mucosa. Neck: Supple, no elevated JVP or carotid bruits, no thyromegaly. Lungs: Clear to auscultation, nonlabored breathing at rest. Cardiac: Regular rate and rhythm, no S3 or significant systolic murmur, no pericardial rub. Abdomen: Soft, nontender, no hepatomegaly, bowel sounds present, no guarding or rebound. Extremities: No pitting edema,  distal pulses 2+.  ECG: I personally reviewed the tracing from 02/09/2016 which showed rate-controlled atrial fibrillation.  Recent Labwork:  October 2015: BUN 13, creatinine 0.8, potassium 3.8, AST 25, ALT 25, cholesterol 134, triglycerides 112, HDL 33, LDL 79  Other Studies Reviewed Today:  Lexiscan Cardiolite 02/03/2015: FINDINGS: Stress data: The patient was stressed according to the Lexiscan protocol. The resting heart rate of 70 beats per min rose to a maximal heart rate of 87 beats per min. The blood pressure averaged 136/66. No chest pain was  reported. Baseline ECG demonstrated sinus rhythm. With stress, frequent PVCs were noted. There were no ischemic ST segment or T-wave abnormalities. PACs were also seen. Both PVCs and PACs persisted into recovery. No chest pain was reported.  Perfusion: There is a moderate sized inferior wall defect with mild to moderately decreased uptake on stress images with partial reversibility on the resting images, extending from the apex to the base. This is suggestive of inferior wall scar with a mild degree of peri-infarct ischemia. However, overlapping soft tissue attenuation artifact cannot entirely be ruled out. Anterior wall soft tissue attenuation artifact is also noted.  Wall Motion: Abnormal septal motion consistent with prior thoracotomy. Possible mild degree of inferior wall hypokinesis. No left ventricular dilation.  Left Ventricular Ejection Fraction: 58 %  End diastolic volume 137 ml  End systolic volume 58 ml  IMPRESSION: 1. Inferior wall scar with possible mild peri-infarct ischemia. However, overlapping soft tissue attenuation artifact cannot entirely be ruled out .  2. Abnormal septal motion consistent with prior thoracotomy. Possible mild degree of inferior wall hypokinesis.  3. Left ventricular ejection fraction 58%  4. Low to intermediate-risk stress test findings*.  Assessment and Plan:  1. Multivessel CAD status post CABG in 2004 with non-bypassed small nondominant RCA is occluded. He has stable angina symptoms. Cardiolite from last year showed inferior scar with mild peri-infarct ischemia and normal LVEF. We will continue medical therapy.  2. Chronic atrial fibrillation, no palpitations at present time. Heart rate is well controlled and he continues on Coumadin for stroke prophylaxis.  3. Hyperlipidemia, continues on Zocor with follow-up per PCP.  Current medicines were reviewed with the patient today.  Disposition: Follow-up with me in 6  months.  Signed, Jonelle SidleSamuel G. Dyshon Philbin, MD, [facility] 08/09/2016 12:27 PM    [facility] [address], [place], [place] [address] Phone: [phone]; Fax: [phone]

## 2016-08-25 ENCOUNTER — Ambulatory Visit: Payer: Medicare Other

## 2016-08-25 DIAGNOSIS — I6523 Occlusion and stenosis of bilateral carotid arteries: Secondary | ICD-10-CM

## 2016-08-26 LAB — VAS US CAROTID
LCCAPDIAS: 0 cm/s
LCCAPSYS: 83 cm/s
LEFT ECA DIAS: -14 cm/s
LEFT VERTEBRAL DIAS: 0 cm/s
LICAPDIAS: -27 cm/s
Left CCA dist dias: 0 cm/s
Left CCA dist sys: 63 cm/s
Left ICA dist dias: -13 cm/s
Left ICA dist sys: -72 cm/s
Left ICA prox sys: -114 cm/s
RCCADSYS: -63 cm/s
RCCAPDIAS: 0 cm/s
RIGHT ECA DIAS: 0 cm/s
RIGHT VERTEBRAL DIAS: -3 cm/s
Right CCA prox sys: 162 cm/s

## 2016-08-31 ENCOUNTER — Other Ambulatory Visit: Payer: Self-pay | Admitting: Cardiology

## 2016-09-20 ENCOUNTER — Ambulatory Visit (INDEPENDENT_AMBULATORY_CARE_PROVIDER_SITE_OTHER): Payer: Medicare Other | Admitting: *Deleted

## 2016-09-20 DIAGNOSIS — Z5181 Encounter for therapeutic drug level monitoring: Secondary | ICD-10-CM | POA: Diagnosis not present

## 2016-09-20 DIAGNOSIS — I4891 Unspecified atrial fibrillation: Secondary | ICD-10-CM | POA: Diagnosis not present

## 2016-09-20 DIAGNOSIS — Z7901 Long term (current) use of anticoagulants: Secondary | ICD-10-CM | POA: Diagnosis not present

## 2016-09-20 LAB — POCT INR: INR: 2.5

## 2016-10-25 ENCOUNTER — Other Ambulatory Visit: Payer: Self-pay | Admitting: Cardiology

## 2016-11-01 ENCOUNTER — Ambulatory Visit (INDEPENDENT_AMBULATORY_CARE_PROVIDER_SITE_OTHER): Payer: Medicare Other | Admitting: *Deleted

## 2016-11-01 DIAGNOSIS — Z7901 Long term (current) use of anticoagulants: Secondary | ICD-10-CM | POA: Diagnosis not present

## 2016-11-01 DIAGNOSIS — I4891 Unspecified atrial fibrillation: Secondary | ICD-10-CM | POA: Diagnosis not present

## 2016-11-01 DIAGNOSIS — Z5181 Encounter for therapeutic drug level monitoring: Secondary | ICD-10-CM

## 2016-11-01 DIAGNOSIS — I6523 Occlusion and stenosis of bilateral carotid arteries: Secondary | ICD-10-CM

## 2016-11-01 LAB — POCT INR: INR: 2

## 2016-11-01 MED ORDER — WARFARIN SODIUM 5 MG PO TABS: ORAL_TABLET | ORAL | 3 refills | Status: DC

## 2016-12-13 ENCOUNTER — Ambulatory Visit (INDEPENDENT_AMBULATORY_CARE_PROVIDER_SITE_OTHER): Payer: Medicare Other | Admitting: *Deleted

## 2016-12-13 DIAGNOSIS — Z5181 Encounter for therapeutic drug level monitoring: Secondary | ICD-10-CM

## 2016-12-13 DIAGNOSIS — Z7901 Long term (current) use of anticoagulants: Secondary | ICD-10-CM | POA: Diagnosis not present

## 2016-12-13 DIAGNOSIS — I4891 Unspecified atrial fibrillation: Secondary | ICD-10-CM

## 2016-12-13 LAB — POCT INR: INR: 2

## 2017-01-26 ENCOUNTER — Ambulatory Visit (INDEPENDENT_AMBULATORY_CARE_PROVIDER_SITE_OTHER): Payer: Medicare Other | Admitting: *Deleted

## 2017-01-26 DIAGNOSIS — Z7901 Long term (current) use of anticoagulants: Secondary | ICD-10-CM

## 2017-01-26 DIAGNOSIS — I4891 Unspecified atrial fibrillation: Secondary | ICD-10-CM | POA: Diagnosis not present

## 2017-01-26 DIAGNOSIS — Z5181 Encounter for therapeutic drug level monitoring: Secondary | ICD-10-CM | POA: Diagnosis not present

## 2017-01-26 LAB — POCT INR: INR: 2.2

## 2017-02-09 NOTE — Progress Notes (Signed)
Cardiology Office Note  Date: 02/10/2017   ID: Clifford King, DOB 1941-03-02, MRN [id number]  PCP: Clifford King,AYOKUNLE, DO  Primary Cardiologist: Nona DellSamuel Alder Murri, MD   Chief Complaint  Patient presents with  . Atrial Fibrillation  . Coronary Artery Disease    History of Present Illness: Clifford King is a 76 y.o. male last seen in September 2017. He is here today with his wife for a routine follow-up visit. He states that over the last 3 months he has become much more short of breath with activity, fatigue, has to rest when walking out to his mailbox and back. No definite chest tightness or palpitations with this. He reports compliance with his medications.  He continues to follow in the anticoagulation clinic on Coumadin. No reported bleeding episodes. Remaining cardiac regimen includes Norvasc, lisinopril, Toprol-XL, and Zocor.  I personally reviewed his ECG today which shows rate-controlled atrial fibrillation with PVCs versus aberrantly conducted complexes and left bundle branch block.  Last ischemic testing was back in 2016.  Past Medical History:  Diagnosis Date  . Atrial fibrillation (HCC)   . Carotid artery disease (HCC)    Nonobstructive  . Coronary atherosclerosis of native coronary artery    Multivessel, LVEF 60%, occluded small nondominant RCA  (not bypassed)  . Essential hypertension, benign   . Mixed hyperlipidemia   . Morbid obesity (HCC)     Past Surgical History:  Procedure Laterality Date  . CORONARY ARTERY BYPASS GRAFT  2004   Off-pump LIMA to LAD, SVG to diagonal    Current Outpatient Prescriptions  Medication Sig Dispense Refill  . allopurinol (ZYLOPRIM) 100 MG tablet Take 100 mg by mouth daily.      Marland Kitchen. amLODipine (NORVASC) 10 MG tablet TAKE 1 TABLET BY MOUTH DAILY 90 tablet 3  . aspirin 81 MG tablet Take 81 mg by mouth daily.      Marland Kitchen. lisinopril (PRINIVIL,ZESTRIL) 20 MG tablet Take 20 mg by mouth daily.      . metoprolol succinate  (TOPROL-XL) 50 MG 24 hr tablet TAKE 1 TABLET BY MOUTH DAILY WITH OR IMMEDIATELY FOLLOWING A MEAL 90 tablet 1  . Nystatin (NYAMYC) 100000 UNIT/GM POWD as needed.     . simvastatin (ZOCOR) 10 MG tablet TAKE 1 TABLET BY MOUTH AT BEDTIME 90 tablet 3  . warfarin (COUMADIN) 5 MG tablet Take 2 tablets daily except 1 tablet on Mondays and Fridays 60 tablet 3   No current facility-administered medications for this visit.    Allergies:  Vicodin [hydrocodone-acetaminophen]   Social History: The patient  reports that he quit smoking about 30 years ago. His smoking use included Cigars. He has a 5.00 pack-year smoking history. He has never used smokeless tobacco. He reports that he does not drink alcohol or use drugs.   ROS:  Please see the history of present illness. Otherwise, complete review of systems is positive for shortness of breath and fatigue.  All other systems are reviewed and negative.   Physical Exam: VS:  BP (!) 156/81 (BP Location: Left Arm, Patient Position: Sitting, Cuff Size: Normal)   Pulse 76   Ht 6\' 5"  (1.956 m)   Wt 277 lb (125.6 kg)   SpO2 96%   BMI 32.85 kg/m , BMI Body mass index is 32.85 kg/m.  Wt Readings from Last 3 Encounters:  02/10/17 277 lb (125.6 kg)  08/09/16 281 lb (127.5 kg)  02/09/16 279 lb 9.6 oz (126.8 kg)    General:  Obese male, appears comfortable  at rest. HEENT: Conjunctiva and lids normal, oropharynx clear with moist mucosa. Neck: Supple, no elevated JVP or carotid bruits, no thyromegaly. Lungs: Clear to auscultation, nonlabored breathing at rest. Cardiac: Irregularly irregular, no S3, 2/6 systolic murmur, no pericardial rub. Abdomen: Soft, nontender, bowel sounds present, no guarding or rebound. Extremities: No pitting edema, distal pulses 2+. Skin: Warm and dry. Musculoskeletal: No kyphosis. Neuropsychiatric: Alert and oriented 3, affect appropriate.  ECG: I personally reviewed the tracing from 02/09/2016 which showed rate-controlled atrial  fibrillation.  Recent Labwork:  October 2015: BUN 13, creatinine 0.8, potassium 3.8, AST 25, ALT 25, cholesterol 134, triglycerides 112, HDL 33, LDL 79  Other Studies Reviewed Today:  Lexiscan Cardiolite 02/03/2015: FINDINGS: Stress data: The patient was stressed according to the Lexiscan protocol. The resting heart rate of 70 beats per min rose to a maximal heart rate of 87 beats per min. The blood pressure averaged 136/66. No chest pain was reported. Baseline ECG demonstrated sinus rhythm. With stress, frequent PVCs were noted. There were no ischemic ST segment or T-wave abnormalities. PACs were also seen. Both PVCs and PACs persisted into recovery. No chest pain was reported.  Perfusion: There is a moderate sized inferior wall defect with mild to moderately decreased uptake on stress images with partial reversibility on the resting images, extending from the apex to the base. This is suggestive of inferior wall scar with a mild degree of peri-infarct ischemia. However, overlapping soft tissue attenuation artifact cannot entirely be ruled out. Anterior wall soft tissue attenuation artifact is also noted.  Wall Motion: Abnormal septal motion consistent with prior thoracotomy. Possible mild degree of inferior wall hypokinesis. No left ventricular dilation.  Left Ventricular Ejection Fraction: 58 %  End diastolic volume 137 ml  End systolic volume 58 ml  IMPRESSION: 1. Inferior wall scar with possible mild peri-infarct ischemia. However, overlapping soft tissue attenuation artifact cannot entirely be ruled out .  2. Abnormal septal motion consistent with prior thoracotomy. Possible mild degree of inferior wall hypokinesis.  3. Left ventricular ejection fraction 58%  4. Low to intermediate-risk stress test findings*.  Assessment and Plan:  1. Progressive exertional shortness of breath and fatigue. Patient states worsening symptoms over the last few months  despite compliance with medications. Last ischemic workup was in 2016. We will obtain a 2 day protocol Lexiscan Myoview on medical therapy to reevaluate ischemic burden. Also echocardiogram for evaluation of cardiac structure and function.  2. Multivessel CAD with occluded nondominant RCA and previous off pump CABG in 2004 including LIMA to the LAD and SVG to the diagonal. He has been maintained on medical therapy and has normal LVEF by last assessment.  3. Chronic atrial fibrillation, heart rate-controlled on beta blocker. Continues on Coumadin for stroke prophylaxis.  4. Hyperlipidemia, on Zocor.  Current medicines were reviewed with the patient today.   Orders Placed This Encounter  Procedures  . EKG 12-Lead    Disposition: Follow-up to review test results.  Signed, Jonelle Sidle, MD, [facility] 02/10/2017 10:26 AM    [facility] at [facility] [address] [place], [place], [place] [address] Phone: [phone]; Fax: [phone]

## 2017-02-10 ENCOUNTER — Telehealth: Payer: Self-pay | Admitting: Cardiology

## 2017-02-10 ENCOUNTER — Encounter: Payer: Self-pay | Admitting: Cardiology

## 2017-02-10 ENCOUNTER — Encounter: Payer: Self-pay | Admitting: *Deleted

## 2017-02-10 ENCOUNTER — Ambulatory Visit (INDEPENDENT_AMBULATORY_CARE_PROVIDER_SITE_OTHER): Payer: Medicare Other | Admitting: Cardiology

## 2017-02-10 VITALS — BP 156/81 | HR 76 | Ht 77.0 in | Wt 277.0 lb

## 2017-02-10 DIAGNOSIS — I482 Chronic atrial fibrillation, unspecified: Secondary | ICD-10-CM

## 2017-02-10 DIAGNOSIS — I209 Angina pectoris, unspecified: Secondary | ICD-10-CM

## 2017-02-10 DIAGNOSIS — R0602 Shortness of breath: Secondary | ICD-10-CM | POA: Diagnosis not present

## 2017-02-10 DIAGNOSIS — E782 Mixed hyperlipidemia: Secondary | ICD-10-CM | POA: Diagnosis not present

## 2017-02-10 DIAGNOSIS — I25119 Atherosclerotic heart disease of native coronary artery with unspecified angina pectoris: Secondary | ICD-10-CM | POA: Diagnosis not present

## 2017-02-10 NOTE — Patient Instructions (Signed)
Medication Instructions:  Continue all current medications.  Labwork: none  Testing/Procedures:  Your physician has requested that you have an echocardiogram. Echocardiography is a painless test that uses sound waves to create images of your heart. It provides your doctor with information about the size and shape of your heart and how well your heart's chambers and valves are working. This procedure takes approximately one hour. There are no restrictions for this procedure.  Your physician has requested that you have a lexiscan myoview. For further information please visit [email or web address] Please follow instruction sheet, as given. (2 day protocol)  Office will contact with results via phone or letter.   Follow-Up: 3 weeks   Any Other Special Instructions Will Be Listed Below (If Applicable).  If you need a refill on your cardiac medications before your next appointment, please call your pharmacy.

## 2017-02-10 NOTE — Telephone Encounter (Signed)
Pre-cert Verification for the following procedure    Lexiscan Myoview scheduled for 02/21/17  Echo scheduled for 02/21/17   Both are scheduled for [facility]

## 2017-02-21 ENCOUNTER — Encounter (HOSPITAL_COMMUNITY): Payer: Self-pay

## 2017-02-21 ENCOUNTER — Ambulatory Visit (HOSPITAL_COMMUNITY)
Admission: RE | Admit: 2017-02-21 | Discharge: 2017-02-21 | Disposition: A | Discharge status: Home / Self Care | Payer: Medicare Other | Source: Ambulatory Visit | Attending: Cardiology | Admitting: Cardiology

## 2017-02-21 ENCOUNTER — Encounter (HOSPITAL_COMMUNITY)
Admission: RE | Admit: 2017-02-21 | Discharge: 2017-02-21 | Disposition: A | Discharge status: Home / Self Care | Payer: Medicare Other | Source: Ambulatory Visit | Attending: Cardiology | Admitting: Cardiology

## 2017-02-21 ENCOUNTER — Inpatient Hospital Stay (HOSPITAL_COMMUNITY): Admission: RE | Admit: 2017-02-21 | Payer: Medicare Other | Source: Ambulatory Visit

## 2017-02-21 ENCOUNTER — Telehealth: Payer: Self-pay

## 2017-02-21 DIAGNOSIS — I25119 Atherosclerotic heart disease of native coronary artery with unspecified angina pectoris: Secondary | ICD-10-CM | POA: Diagnosis present

## 2017-02-21 DIAGNOSIS — R0602 Shortness of breath: Secondary | ICD-10-CM | POA: Insufficient documentation

## 2017-02-21 DIAGNOSIS — I340 Nonrheumatic mitral (valve) insufficiency: Secondary | ICD-10-CM | POA: Insufficient documentation

## 2017-02-21 DIAGNOSIS — R9439 Abnormal result of other cardiovascular function study: Secondary | ICD-10-CM | POA: Diagnosis not present

## 2017-02-21 LAB — NM MYOCAR MULTI W/SPECT W/WALL MOTION / EF
CHL CUP NUCLEAR SDS: 1
CHL CUP NUCLEAR SRS: 4
CHL CUP RESTING HR STRESS: 65 {beats}/min
CSEPPHR: 71 {beats}/min
LHR: 0.52
LV dias vol: 226 mL (ref 62–150)
LV sys vol: 139 mL
NUC STRESS TID: 1.12
SSS: 5

## 2017-02-21 MED ORDER — REGADENOSON 0.4 MG/5ML IV SOLN
INTRAVENOUS | Status: AC
  Administered 2017-02-21: 0.4 mg via INTRAVENOUS
  Filled 2017-02-21: qty 5

## 2017-02-21 MED ORDER — TECHNETIUM TC 99M TETROFOSMIN IV KIT
30.0000 | PACK | Freq: Once | INTRAVENOUS | Status: AC | PRN
  Administered 2017-02-21: 33 via INTRAVENOUS

## 2017-02-21 MED ORDER — SODIUM CHLORIDE 0.9% FLUSH
INTRAVENOUS | Status: AC
  Administered 2017-02-21: 10 mL via INTRAVENOUS
  Filled 2017-02-21: qty 10

## 2017-02-21 MED ORDER — TECHNETIUM TC 99M TETROFOSMIN IV KIT
10.0000 | PACK | Freq: Once | INTRAVENOUS | Status: AC | PRN
  Administered 2017-02-21: 10.6 via INTRAVENOUS

## 2017-02-21 NOTE — Telephone Encounter (Signed)
-----   Message from Jonelle Sidle, MD sent at 02/21/2017 12:51 PM EDT ----- Results reviewed. LVEF mildly reduced in 45-50% range as compared to prior assessment. Await Myoview results to decide about next step in management. A copy of this test should be forwarded to FATADE,AYOKUNLE, DO.

## 2017-02-21 NOTE — Progress Notes (Signed)
*  PRELIMINARY RESULTS* Echocardiogram 2D Echocardiogram has been performed.  Clifford King 02/21/2017, 10:59 AM

## 2017-02-21 NOTE — Telephone Encounter (Signed)
-----   Message from Jonelle Sidle, MD sent at 02/21/2017  3:16 PM EDT ----- Results reviewed. Intermediate stress test as outlined based on reduction in LVEF. He had evidence of previous inferior scar but no active ischemia. Based on this we may try to adjust medications first. Please make sure that he has an office follow-up visit. A copy of this test should be forwarded to FATADE,AYOKUNLE, DO.

## 2017-02-21 NOTE — Telephone Encounter (Signed)
Patient notified. Follow up appointment 02/24/17. Routed to PCP

## 2017-02-21 NOTE — Telephone Encounter (Signed)
Patient notified. Follow up appointment on 02/24/17. Routed to PCP

## 2017-02-23 NOTE — Progress Notes (Signed)
Cardiology Office Note  Date: 02/24/2017   ID: AIVEN KAMPE, DOB 1941-03-12, MRN [id number]  PCP: Chana Bode DO  Primary Cardiologist: Nona Dell, MD   Chief Complaint  Patient presents with  . Follow-up testing    History of Present Illness: Clifford King is a 76 y.o. male that I saw recently in March with subsequent follow-up cardiac testing arranged. He presents to review the results. He is here today with his wife. No major change in symptoms, predominantly dyspnea on exertion. We discussed the results of his testing.  Echocardiogram revealed LVEF 45-50% range with mild mitral regurgitation, severe left atrial enlargement, and a PASP 40 mmHg. Lexiscan Myoview did not demonstrate active ischemia, most likely inferior wall scar, LVEF was 39% by that study.  He did not have any substantial ischemic burden and is on a reasonable cardiac regimen. I do have some concern that his heart rate may not be optimally controlled with activity in atrial fibrillation and this could have impacted his LVEF as well. We have discussed increasing Toprol-XL to see if he feels better, otherwise we may need to push forward with invasive evaluation.  Past Medical History:  Diagnosis Date  . Atrial fibrillation (HCC)   . Carotid artery disease (HCC)    Nonobstructive  . Coronary atherosclerosis of native coronary artery    Multivessel, LVEF 60%, occluded small nondominant RCA  (not bypassed)  . Essential hypertension, benign   . Mixed hyperlipidemia   . Morbid obesity (HCC)     Past Surgical History:  Procedure Laterality Date  . CORONARY ARTERY BYPASS GRAFT  2004   Off-pump LIMA to LAD, SVG to diagonal    Current Outpatient Prescriptions  Medication Sig Dispense Refill  . allopurinol (ZYLOPRIM) 100 MG tablet Take 100 mg by mouth daily.      Marland Kitchen amLODipine (NORVASC) 10 MG tablet TAKE 1 TABLET BY MOUTH DAILY 90 tablet 3  . aspirin 81 MG tablet Take 81 mg by mouth daily.       Marland Kitchen lisinopril (PRINIVIL,ZESTRIL) 20 MG tablet Take 20 mg by mouth daily.      Marland Kitchen Nystatin (NYAMYC) 100000 UNIT/GM POWD as needed.     . simvastatin (ZOCOR) 10 MG tablet TAKE 1 TABLET BY MOUTH AT BEDTIME 90 tablet 3  . warfarin (COUMADIN) 5 MG tablet TAKE AS DIRECTED BY COUMADIN CLINIC 180 tablet 0  . metoprolol succinate (TOPROL-XL) 50 MG 24 hr tablet Take 1 & 1/2 tablets daily 135 tablet 1   No current facility-administered medications for this visit.    Allergies:  Vicodin [hydrocodone-acetaminophen]   Social History: The patient  reports that he quit smoking about 30 years ago. His smoking use included Cigars. He has a 5.00 pack-year smoking history. He has never used smokeless tobacco. He reports that he does not drink alcohol or use drugs.   ROS:  Please see the history of present illness. Otherwise, complete review of systems is positive for none.  All other systems are reviewed and negative.   Physical Exam: VS:  BP 122/88   Pulse 88   Ht  (1.956 m)   Wt 273 lb (123.8 kg)   SpO2 99%   BMI 32.37 kg/m , BMI Body mass index is 32.37 kg/m.  Wt Readings from Last 3 Encounters:  02/24/17 273 lb (123.8 kg)  02/10/17 277 lb (125.6 kg)  08/09/16 281 lb (127.5 kg)    General:  Obese male, appears comfortable at rest. HEENT: Conjunctiva and lids  normal, oropharynx clear with moist mucosa. Neck: Supple, no elevated JVP or carotid bruits, no thyromegaly. Lungs: Clear to auscultation, nonlabored breathing at rest. Cardiac: Irregularly irregular, no S3, 2/6 systolic murmur, no pericardial rub. Abdomen: Soft, nontender, bowel sounds present, no guarding or rebound. Extremities: No pitting edema, distal pulses 2+. Skin: Warm and dry. Musculoskeletal: No kyphosis. Neuropsychiatric: Alert and oriented 3, affect appropriate.  ECG: I personally reviewed the tracing from 02/10/2017 which showed rate-controlled atrial fibrillation with aberrantly conducted complexes versus PVCs and left  bundle branch block.  Recent Labwork:  October 2015: BUN 13, creatinine 0.8, potassium 3.8, AST 25, ALT 25, cholesterol 134, triglycerides 112, HDL 33, LDL 79  Other Studies Reviewed Today:  Echocardiogram 02/21/2017: Study Conclusions  - Left ventricle: The cavity size was normal. There was mild   concentric hypertrophy. Systolic function was low normal to   mildly reduced. The estimated ejection fraction was in the range   of 45% to 50%. Diffuse hypokinesis. The study was not technically   sufficient to allow evaluation of LV diastolic dysfunction due to   atrial fibrillation. - Aortic valve: Peak velocity (S): 415 cm/s. Mean gradient (S): 41   mm Hg. Valve area (VTI): 0.6 cm^2. Valve area (Vmax): 0.59 cm^2.   Valve area (Vmean): 0.59 cm^2. - Mitral valve: Mildly calcified annulus. There was mild   regurgitation. - Left atrium: The atrium was severely dilated. - Right ventricle: Systolic function was mildly reduced. - Right atrium: The atrium was mildly dilated. - Pulmonary arteries: PA peak pressure: 40 mm Hg (S).  Lexiscan Myoview 02/21/2017:  Defect 1: There is a medium defect of mild severity present in the mid inferoseptal, mid inferior and apical inferior location.  Findings consistent with prior myocardial infarction. No ischemic territories.  This is an intermediate risk study.  Nuclear stress EF: 39%.  Assessment and Plan:  1. Dyspnea on exertion. No substantial ischemic burden noted by recent Myoview. LVEF 45-50% range by echocardiography. At this point our plan is to increase Toprol-XL to 75 mg daily to see if better heart rate control of atrial fibrillation is beneficial in terms of symptoms. If not, we may need to consider further invasive testing.  2. CAD status post CABG in 2004. He underwent LIMA to LAD and SVG to diagonal. No anterior or lateral ischemia noted by Myoview. Inferior scar with known small nondominant RCA that is occluded and not bypassed.  Continue medical therapy for now.  3. Essential hypertension, blood pressure is well controlled today.  4. Chronic atrial fibrillation. Increasing Toprol-XL to see if better heart rate control is helpful in terms of symptoms and his cardiomyopathy. Keep follow-up on Coumadin in the anticoagulation clinic.  Current medicines were reviewed with the patient today.  Disposition: Follow-up in about 4 weeks.  Signed, Jonelle Sidle, MD, [facility] 02/24/2017 10:51 AM    [facility] at [facility] [address] [place], [place], [place] [address] Phone: [phone]; Fax: [phone]

## 2017-02-24 ENCOUNTER — Encounter: Payer: Self-pay | Admitting: Cardiology

## 2017-02-24 ENCOUNTER — Ambulatory Visit (INDEPENDENT_AMBULATORY_CARE_PROVIDER_SITE_OTHER): Payer: Medicare Other | Admitting: Cardiology

## 2017-02-24 VITALS — BP 122/88 | HR 88 | Ht 77.0 in | Wt 273.0 lb

## 2017-02-24 DIAGNOSIS — I25119 Atherosclerotic heart disease of native coronary artery with unspecified angina pectoris: Secondary | ICD-10-CM | POA: Diagnosis not present

## 2017-02-24 DIAGNOSIS — I482 Chronic atrial fibrillation, unspecified: Secondary | ICD-10-CM

## 2017-02-24 DIAGNOSIS — R0609 Other forms of dyspnea: Secondary | ICD-10-CM

## 2017-02-24 DIAGNOSIS — I209 Angina pectoris, unspecified: Secondary | ICD-10-CM | POA: Diagnosis not present

## 2017-02-24 DIAGNOSIS — I10 Essential (primary) hypertension: Secondary | ICD-10-CM | POA: Diagnosis not present

## 2017-02-24 MED ORDER — WARFARIN SODIUM 5 MG PO TABS: ORAL_TABLET | ORAL | 0 refills | Status: DC

## 2017-02-24 MED ORDER — METOPROLOL SUCCINATE ER 50 MG PO TB24: ORAL_TABLET | ORAL | 1 refills | Status: DC

## 2017-02-24 NOTE — Patient Instructions (Signed)
Your physician recommends that you schedule a follow-up appointment in: 4 WEEKS WITH DR MCDOWELL  Your physician has recommended you make the following change in your medication:   INCREASE TOPROL XL 75 MG DAILY (1 & 1/2 TABLETS)  Thank you for choosing [facility]!!

## 2017-03-09 ENCOUNTER — Ambulatory Visit (INDEPENDENT_AMBULATORY_CARE_PROVIDER_SITE_OTHER): Payer: Medicare Other | Admitting: *Deleted

## 2017-03-09 DIAGNOSIS — I25119 Atherosclerotic heart disease of native coronary artery with unspecified angina pectoris: Secondary | ICD-10-CM

## 2017-03-09 DIAGNOSIS — Z5181 Encounter for therapeutic drug level monitoring: Secondary | ICD-10-CM | POA: Diagnosis not present

## 2017-03-09 DIAGNOSIS — I4891 Unspecified atrial fibrillation: Secondary | ICD-10-CM | POA: Diagnosis not present

## 2017-03-09 DIAGNOSIS — Z7901 Long term (current) use of anticoagulants: Secondary | ICD-10-CM | POA: Diagnosis not present

## 2017-03-09 LAB — POCT INR: INR: 2.7

## 2017-03-20 NOTE — Progress Notes (Signed)
  Cardiology Office Note  Date: 03/21/2017   ID: Clifford King, DOB 05/12/1941, MRN [id number]  PCP: Fatade, Ayokunle, DO  Primary Cardiologist: Gurfateh Mcclain, MD   Chief Complaint  Patient presents with  . Atrial Fibrillation  . Coronary Artery Disease    History of Present Illness: Clifford King is a 75 y.o. male last seen in April. He presents for a follow-up visit. We did increase his Toprol-XL for further heart rate control of atrial fibrillation.He states that this has not made any significant difference in his symptoms. Still reporting exertional fatigue and breathlessness, tingling in his left arm and left chest. He has not had any syncope.  Current cardiac medications are outlined below. He reports compliance with therapy.  He continues on Coumadin with follow-up in the anticoagulation clinic. He does not report any bleeding episodes.  Today I reviewed his recent noninvasive studies again and we went over the results. Myoview study is most consistent with inferior scar. He does have known occlusion of a small nondominant RCA that was not bypassed. There were no ischemic anterior or lateral defects. I personally reviewed his echocardiogram images today and would grade his LVEF closer to 40%. He also looks to have fairly significant aortic stenosis, higher end of moderate to severe range. 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  Past Medical History:  Diagnosis Date  . Atrial fibrillation (HCC)   . Carotid artery disease (HCC)    Nonobstructive  . Coronary atherosclerosis of native coronary artery    Multivessel, LVEF 60%, occluded small nondominant RCA  (not bypassed)  . Essential hypertension, benign   . Mixed hyperlipidemia   . Morbid obesity (HCC)     Past Surgical History:  Procedure Laterality Date  . CORONARY ARTERY BYPASS GRAFT  2004   Off-pump LIMA to LAD, SVG to diagonal     Current Outpatient Prescriptions  Medication Sig Dispense Refill  . allopurinol (ZYLOPRIM) 100 MG tablet Take 100 mg by mouth daily.      . amLODipine (NORVASC) 10 MG tablet TAKE 1 TABLET BY MOUTH DAILY 90 tablet 3  . aspirin 81 MG tablet Take 81 mg by mouth daily.      . lisinopril (PRINIVIL,ZESTRIL) 20 MG tablet Take 20 mg by mouth daily.      . metoprolol succinate (TOPROL-XL) 50 MG 24 hr tablet Take 1 & 1/2 tablets daily 135 tablet 1  . Nystatin (NYAMYC) 100000 UNIT/GM POWD as needed.     . simvastatin (ZOCOR) 10 MG tablet TAKE 1 TABLET BY MOUTH AT BEDTIME 90 tablet 3  . warfarin (COUMADIN) 5 MG tablet TAKE AS DIRECTED BY COUMADIN CLINIC 180 tablet 0   No current facility-administered medications for this visit.    Allergies:  Vicodin [hydrocodone-acetaminophen]   Social History: The patient  reports that he quit smoking about 30 years ago. His smoking use included Cigars. He started smoking about 40 years ago. He has a 5.00 pack-year smoking history. He has never used smokeless tobacco. He reports that he does not drink alcohol or use drugs.   Family History: The patient's family history includes Cancer in his other; Stroke in his other.   ROS:  Please see the history of present illness. Otherwise, complete review of systems is positive for progressive fatigue.  All other systems are reviewed and negative.   Physical Exam: VS:  BP 132/73   Pulse 63   Ht 6' 5" (1.956 m)     Wt 277 lb (125.6 kg)   SpO2 96% Comment: on room air  BMI 32.85 kg/m , BMI Body mass index is 32.85 kg/m.  Wt Readings from Last 3 Encounters:  03/21/17 277 lb (125.6 kg)  02/24/17 273 lb (123.8 kg)  02/10/17 277 lb (125.6 kg)    General: Obese male, appearscomfortable at rest. HEENT: Conjunctiva and lids normal, oropharynx clear with moist mucosa. Neck: Supple, no elevated JVP or carotid bruits, no thyromegaly. Lungs: Clear to auscultation, nonlabored breathing at rest. Cardiac: Irregularly  irregular, no S3, 2-3/6 systolic murmur, no pericardial rub. Abdomen: Soft, nontender, bowel sounds present, no guarding or rebound. Extremities: No pitting edema, distal pulses 2+. Skin: Warm and dry. Musculoskeletal: No kyphosis. Neuropsychiatric: Alert and oriented 3, affect appropriate.  ECG: I personally reviewed the tracing from 02/10/2017 which showed rate-controlled atrial fibrillation with aberrantly conducted complexes versus PVCs and left bundle branch block.  Recent Labwork:  October 2015: BUN 13, creatinine 0.8, potassium 3.8, AST 25, ALT 25, cholesterol 134, triglycerides 112, HDL 33, LDL 79  Other Studies Reviewed Today:  Echocardiogram 02/21/2017: Study Conclusions  - Left ventricle: The cavity size was normal. There was mild concentric hypertrophy. Systolic function was low normal to mildly reduced. The estimated ejection fraction was in the range of 45% to 50%. Diffuse hypokinesis. The study was not technically sufficient to allow evaluation of LV diastolic dysfunction due to atrial fibrillation. - Aortic valve: Peak velocity (S): 415 cm/s. Mean gradient (S): 41 mm Hg. Valve area (VTI): 0.6 cm^2. Valve area (Vmax): 0.59 cm^2. Valve area (Vmean): 0.59 cm^2. - Mitral valve: Mildly calcified annulus. There was mild regurgitation. - Left atrium: The atrium was severely dilated. - Right ventricle: Systolic function was mildly reduced. - Right atrium: The atrium was mildly dilated. - Pulmonary arteries: PA peak pressure: 40 mm Hg (S).  Lexiscan Myoview 02/21/2017:  Defect 1: There is a medium defect of mild severity present in the mid inferoseptal, mid inferior and apical inferior location.  Findings consistent with prior myocardial infarction. No ischemic territories.  This is an intermediate risk study.  Nuclear stress EF: 39%.  Assessment and Plan:  1. Progressive fatigue, dyspnea on exertion, left arm and chest tingling. Angina certainly a  possibility although recent Myoview indicates inferior scar which would fit with known occlusion of a small nondominant RCA that was not bypassed. He did not have any large anterior or lateral defects, has history of LIMA to the LAD and SVG to the diagonal with off pump bypass. I increased his beta blocker to see if further heart rate control of atrial fibrillation would help and it has not. Of additional concern is what looks to be fairly significant aortic stenosis based on my review of his echocardiographic images from April. Plan at this time is to schedule a left and right heart catheterization to clarify coronary and bypass graft anatomy, also further assess aortic valve. He may actually need to be considered for AVR versus TAVR. He will come off of Coumadin for heart catheterization, we will obtain baseline blood work.  2. CAD status post off-pump CABG in 2004 with LIMA to LAD and SVG to the diagonal. He has a small nondominant RCA that is occluded and was not bypassed.  3. Chronic atrial fibrillation being managed with strategy of heart rate control and anticoagulation. He has a severely dilated left atrium. Heart rate is adequately controlled at this time.  4. Cardiomyopathy, LVEF looks to be around 40% by recent imaging studies. Question   whether this could be related to progressive aortic stenosis. Also need to exclude worsening CAD or graft disease. Heart catheterization being pursued as outlined above.  5. Essential hypertension, blood pressure control has been adequate.  Current medicines were reviewed with the patient today.   Orders Placed This Encounter  Procedures  . DG Chest 2 View  . CBC  . Basic metabolic panel  . Protime-INR  . APTT     Disposition: Follow-up after cardiac catheterization.  Signed, Lemoine Goyne G. Koron Godeaux, MD, FACC 03/21/2017 8:52 AM    [facility] [place] [address], [place], [place] [address] Phone: [phone]; Fax: [phone] 

## 2017-03-21 ENCOUNTER — Telehealth: Payer: Self-pay | Admitting: Cardiology

## 2017-03-21 ENCOUNTER — Encounter: Payer: Self-pay | Admitting: Cardiology

## 2017-03-21 ENCOUNTER — Ambulatory Visit (INDEPENDENT_AMBULATORY_CARE_PROVIDER_SITE_OTHER): Payer: Medicare Other | Admitting: Cardiology

## 2017-03-21 ENCOUNTER — Other Ambulatory Visit: Payer: Self-pay | Admitting: Cardiology

## 2017-03-21 VITALS — BP 132/73 | HR 63 | Ht 77.0 in | Wt 277.0 lb

## 2017-03-21 DIAGNOSIS — I25119 Atherosclerotic heart disease of native coronary artery with unspecified angina pectoris: Secondary | ICD-10-CM | POA: Diagnosis not present

## 2017-03-21 DIAGNOSIS — I350 Nonrheumatic aortic (valve) stenosis: Secondary | ICD-10-CM

## 2017-03-21 DIAGNOSIS — Z01812 Encounter for preprocedural laboratory examination: Secondary | ICD-10-CM

## 2017-03-21 DIAGNOSIS — I482 Chronic atrial fibrillation, unspecified: Secondary | ICD-10-CM

## 2017-03-21 DIAGNOSIS — R0609 Other forms of dyspnea: Principal | ICD-10-CM

## 2017-03-21 DIAGNOSIS — I209 Angina pectoris, unspecified: Secondary | ICD-10-CM

## 2017-03-21 DIAGNOSIS — I429 Cardiomyopathy, unspecified: Secondary | ICD-10-CM

## 2017-03-21 DIAGNOSIS — I10 Essential (primary) hypertension: Secondary | ICD-10-CM

## 2017-03-21 LAB — PROTIME-INR

## 2017-03-21 NOTE — Patient Instructions (Signed)
Medication Instructions:  Continue all current medications.  Labwork: BMET, CBC, PT, PTT - orders given today.  Testing/Procedures:  Chest x-ray - order given today.  Your physician has requested that you have a cardiac catheterization. Cardiac catheterization is used to diagnose and/or treat various heart conditions. Doctors may recommend this procedure for a number of different reasons. The most common reason is to evaluate chest pain. Chest pain can be a symptom of coronary artery disease (CAD), and cardiac catheterization can show whether plaque is narrowing or blocking your heart's arteries. This procedure is also used to evaluate the valves, as well as measure the blood flow and oxygen levels in different parts of your heart. For further information please visit [email or web address]. Please follow instruction sheet, as given.  Follow-Up: 1 month   Any Other Special Instructions Will Be Listed Below (If Applicable).   [facility] [place] [address] [place] [address] Dept: [phone] Loc: [phone]  Clifford King  03/21/2017  You are scheduled for a Cardiac Catheterization on Wednesday, May 16 with Dr. Tonny BollmanMichael King at 2:30 pm.   1. Please arrive at the [facility]: [address] [place], [place] [address] at 12:30 PM (two hours before your procedure to ensure your preparation). Free valet parking service is available.   Special note: Every effort is made to have your procedure done on time. Please understand that emergencies sometimes delay scheduled procedures.  2. Diet: You may have a clear liquid breakfast, but nothing after 8 AM on your procedure day.  3. Labs: You will need to have blood drawn on Friday, May 11 at [facility] [address], [place]  Open: 7am - 6pm, Sat 8am - 12 noon   Phone: [phone]. You do  not need to be fasting.  4. Medication instructions in preparation for your procedure:  Stop taking Coumadin (Warfarin) on Thursday, May 10.  On the morning of your procedure, take your Aspirin and any morning medicines NOT listed above.  You may use sips of water.  5. Plan for one night stay--bring personal belongings. 6. Bring a current list of your medications and current insurance cards. 7. You MUST have a responsible person to drive you home. 8. Someone MUST be with you the first 24 hours after you arrive home or your discharge will be delayed. 9. Please wear clothes that are easy to get on and off and wear slip-on shoes.  Thank you for allowing us to care for you!   -- [facility]   If you need a refill on your cardiac medications before your next appointment, please call your pharmacy.

## 2017-03-21 NOTE — Telephone Encounter (Signed)
Pre-cert Verification for the following procedure    Left & right heart cath - Wednesday, 5/16 at 2:30 w/ Excell Seltzerooper

## 2017-03-29 ENCOUNTER — Ambulatory Visit (HOSPITAL_COMMUNITY)
Admission: RE | Admit: 2017-03-29 | Discharge: 2017-03-29 | Disposition: A | Discharge status: Home / Self Care | Payer: Medicare Other | Source: Ambulatory Visit | Attending: Cardiovascular Disease | Admitting: Cardiovascular Disease

## 2017-03-29 ENCOUNTER — Encounter (HOSPITAL_COMMUNITY)
Admission: RE | Disposition: A | Discharge status: Home / Self Care | Payer: Self-pay | Source: Ambulatory Visit | Attending: Cardiovascular Disease

## 2017-03-29 DIAGNOSIS — I429 Cardiomyopathy, unspecified: Secondary | ICD-10-CM | POA: Diagnosis not present

## 2017-03-29 DIAGNOSIS — Z6832 Body mass index (BMI) 32.0-32.9, adult: Secondary | ICD-10-CM | POA: Diagnosis not present

## 2017-03-29 DIAGNOSIS — I5042 Chronic combined systolic (congestive) and diastolic (congestive) heart failure: Secondary | ICD-10-CM | POA: Insufficient documentation

## 2017-03-29 DIAGNOSIS — Z87891 Personal history of nicotine dependence: Secondary | ICD-10-CM | POA: Insufficient documentation

## 2017-03-29 DIAGNOSIS — I110 Hypertensive heart disease with heart failure: Secondary | ICD-10-CM | POA: Diagnosis not present

## 2017-03-29 DIAGNOSIS — E782 Mixed hyperlipidemia: Secondary | ICD-10-CM | POA: Insufficient documentation

## 2017-03-29 DIAGNOSIS — Z7982 Long term (current) use of aspirin: Secondary | ICD-10-CM | POA: Diagnosis not present

## 2017-03-29 DIAGNOSIS — I350 Nonrheumatic aortic (valve) stenosis: Secondary | ICD-10-CM | POA: Insufficient documentation

## 2017-03-29 DIAGNOSIS — R0609 Other forms of dyspnea: Secondary | ICD-10-CM

## 2017-03-29 DIAGNOSIS — Z951 Presence of aortocoronary bypass graft: Secondary | ICD-10-CM | POA: Insufficient documentation

## 2017-03-29 DIAGNOSIS — I2582 Chronic total occlusion of coronary artery: Secondary | ICD-10-CM | POA: Insufficient documentation

## 2017-03-29 DIAGNOSIS — I2510 Atherosclerotic heart disease of native coronary artery without angina pectoris: Secondary | ICD-10-CM | POA: Insufficient documentation

## 2017-03-29 DIAGNOSIS — Z7901 Long term (current) use of anticoagulants: Secondary | ICD-10-CM | POA: Diagnosis not present

## 2017-03-29 HISTORY — PX: RIGHT/LEFT HEART CATH AND CORONARY ANGIOGRAPHY: CATH118266

## 2017-03-29 LAB — PROTIME-INR
INR: 1.17
PROTHROMBIN TIME: 15 s (ref 11.4–15.2)

## 2017-03-29 LAB — POCT I-STAT 3, ART BLOOD GAS (G3+)
ACID-BASE EXCESS: 1 mmol/L (ref 0.0–2.0)
Bicarbonate: 25.6 mmol/L (ref 20.0–28.0)
O2 Saturation: 89 %
TCO2: 27 mmol/L (ref 0–100)
pCO2 arterial: 38.1 mmHg (ref 32.0–48.0)
pH, Arterial: 7.436 (ref 7.350–7.450)
pO2, Arterial: 55 mmHg — ABNORMAL LOW (ref 83.0–108.0)

## 2017-03-29 LAB — POCT ACTIVATED CLOTTING TIME: Activated Clotting Time: 175 seconds

## 2017-03-29 LAB — POCT I-STAT 3, VENOUS BLOOD GAS (G3P V)
Acid-Base Excess: 2 mmol/L (ref 0.0–2.0)
Bicarbonate: 26.4 mmol/L (ref 20.0–28.0)
O2 SAT: 61 %
PCO2 VEN: 41 mmHg — AB (ref 44.0–60.0)
TCO2: 28 mmol/L (ref 0–100)
pH, Ven: 7.417 (ref 7.250–7.430)
pO2, Ven: 31 mmHg — CL (ref 32.0–45.0)

## 2017-03-29 SURGERY — RIGHT/LEFT HEART CATH AND CORONARY ANGIOGRAPHY
Anesthesia: LOCAL

## 2017-03-29 MED ORDER — LIDOCAINE HCL 1 % IJ SOLN
INTRAMUSCULAR | Status: AC
  Filled 2017-03-29: qty 20

## 2017-03-29 MED ORDER — IOPAMIDOL (ISOVUE-370) INJECTION 76%
INTRAVENOUS | Status: AC
  Filled 2017-03-29: qty 125

## 2017-03-29 MED ORDER — ASPIRIN 81 MG PO CHEW
81.0000 mg | CHEWABLE_TABLET | ORAL | Status: AC
  Administered 2017-03-29: 81 mg via ORAL

## 2017-03-29 MED ORDER — SODIUM CHLORIDE 0.9 % IV SOLN: 250.0000 mL | INTRAVENOUS | Status: DC | PRN

## 2017-03-29 MED ORDER — FENTANYL CITRATE (PF) 100 MCG/2ML IJ SOLN
INTRAMUSCULAR | Status: DC | PRN
  Administered 2017-03-29: 25 ug via INTRAVENOUS

## 2017-03-29 MED ORDER — HEPARIN (PORCINE) IN NACL 2-0.9 UNIT/ML-% IJ SOLN
INTRAMUSCULAR | Status: AC | PRN
  Administered 2017-03-29: 1000 mL

## 2017-03-29 MED ORDER — VERAPAMIL HCL 2.5 MG/ML IV SOLN
INTRAVENOUS | Status: AC
  Filled 2017-03-29: qty 2

## 2017-03-29 MED ORDER — FENTANYL CITRATE (PF) 100 MCG/2ML IJ SOLN
INTRAMUSCULAR | Status: AC
  Filled 2017-03-29: qty 2

## 2017-03-29 MED ORDER — SODIUM CHLORIDE 0.9% FLUSH: 3.0000 mL | INTRAVENOUS | Status: DC | PRN

## 2017-03-29 MED ORDER — HEPARIN (PORCINE) IN NACL 2-0.9 UNIT/ML-% IJ SOLN
INTRAMUSCULAR | Status: AC
  Filled 2017-03-29: qty 1000

## 2017-03-29 MED ORDER — MIDAZOLAM HCL 2 MG/2ML IJ SOLN
INTRAMUSCULAR | Status: AC
  Filled 2017-03-29: qty 2

## 2017-03-29 MED ORDER — ONDANSETRON HCL 4 MG/2ML IJ SOLN: 4.0000 mg | Freq: Four times a day (QID) | INTRAMUSCULAR | Status: DC | PRN

## 2017-03-29 MED ORDER — LIDOCAINE HCL (PF) 1 % IJ SOLN
INTRAMUSCULAR | Status: DC | PRN
  Administered 2017-03-29: 12 mL
  Administered 2017-03-29: 5 mL via INTRADERMAL

## 2017-03-29 MED ORDER — POTASSIUM CHLORIDE ER 10 MEQ PO TBCR: 10.0000 meq | EXTENDED_RELEASE_TABLET | Freq: Every day | ORAL | 1 refills | Status: DC

## 2017-03-29 MED ORDER — SODIUM CHLORIDE 0.9 % IV SOLN: INTRAVENOUS | Status: DC

## 2017-03-29 MED ORDER — SODIUM CHLORIDE 0.9% FLUSH: 3.0000 mL | Freq: Two times a day (BID) | INTRAVENOUS | Status: DC

## 2017-03-29 MED ORDER — VERAPAMIL HCL 2.5 MG/ML IV SOLN
INTRAVENOUS | Status: DC | PRN
  Administered 2017-03-29: 10 mL via INTRA_ARTERIAL

## 2017-03-29 MED ORDER — FUROSEMIDE 40 MG PO TABS: 40.0000 mg | ORAL_TABLET | Freq: Every day | ORAL | 1 refills | Status: DC

## 2017-03-29 MED ORDER — IOPAMIDOL (ISOVUE-370) INJECTION 76%
INTRAVENOUS | Status: DC | PRN
  Administered 2017-03-29: 85 mL via INTRA_ARTERIAL

## 2017-03-29 MED ORDER — ASPIRIN 81 MG PO CHEW
CHEWABLE_TABLET | ORAL | Status: AC
  Filled 2017-03-29: qty 1

## 2017-03-29 MED ORDER — HEPARIN SODIUM (PORCINE) 1000 UNIT/ML IJ SOLN
INTRAMUSCULAR | Status: AC
  Filled 2017-03-29: qty 1

## 2017-03-29 MED ORDER — HEPARIN SODIUM (PORCINE) 1000 UNIT/ML IJ SOLN
INTRAMUSCULAR | Status: DC | PRN
  Administered 2017-03-29: 6000 [IU] via INTRAVENOUS

## 2017-03-29 MED ORDER — SODIUM CHLORIDE 0.9 % IV SOLN
INTRAVENOUS | Status: DC
  Administered 2017-03-29: 13:00:00 via INTRAVENOUS

## 2017-03-29 MED ORDER — MIDAZOLAM HCL 2 MG/2ML IJ SOLN
INTRAMUSCULAR | Status: DC | PRN
  Administered 2017-03-29: 1 mg via INTRAVENOUS

## 2017-03-29 SURGICAL SUPPLY — 14 items
CATH EXPO 5FR AL2 (CATHETERS) ×1 IMPLANT
CATH INFINITI MULTIPACK ST 5F (CATHETERS) ×1 IMPLANT
CATH SWAN GANZ 7F STRAIGHT (CATHETERS) ×1 IMPLANT
DEVICE RAD COMP TR BAND LRG (VASCULAR PRODUCTS) ×1 IMPLANT
GLIDESHEATH SLEND SS 6F .021 (SHEATH) ×2 IMPLANT
GUIDEWIRE INQWIRE 1.5J.035X260 (WIRE) IMPLANT
INQWIRE 1.5J .035X260CM (WIRE) ×2
KIT HEART LEFT (KITS) ×2 IMPLANT
PACK CARDIAC CATHETERIZATION (CUSTOM PROCEDURE TRAY) ×2 IMPLANT
SHEATH PINNACLE 7F 10CM (SHEATH) ×1 IMPLANT
TRANSDUCER W/STOPCOCK (MISCELLANEOUS) ×2 IMPLANT
TUBING CIL FLEX 10 FLL-RA (TUBING) ×2 IMPLANT
WIRE EMERALD ST .035X260CM (WIRE) ×1 IMPLANT
WIRE HI TORQ VERSACORE-J 145CM (WIRE) ×2 IMPLANT

## 2017-03-29 NOTE — Discharge Instructions (Signed)
RESTART COUMADIN/WARFARIN TONIGHT PICK UP SCRIPT/ LASIX=WATER PILL AND POTASSIUM TO START TOMORROW MORNING 5/17    Radial Site Care Refer to this sheet in the next few weeks. These instructions provide you with information about caring for yourself after your procedure. Your health care provider may also give you more specific instructions. Your treatment has been planned according to current medical practices, but problems sometimes occur. Call your health care provider if you have any problems or questions after your procedure. What can I expect after the procedure? After your procedure, it is typical to have the following:  Bruising at the radial site that usually fades within 1-2 weeks.  Blood collecting in the tissue (hematoma) that may be painful to the touch. It should usually decrease in size and tenderness within 1-2 weeks. Follow these instructions at home:  Take medicines only as directed by your health care provider.  You may shower 24-48 hours after the procedure or as directed by your health care provider. Remove the bandage (dressing) and gently wash the site with plain soap and water. Pat the area dry with a clean towel. Do not rub the site, because this may cause bleeding.  Do not take baths, swim, or use a hot tub until your health care provider approves.  Check your insertion site every day for redness, swelling, or drainage.  Do not apply powder or lotion to the site.  Do not flex or bend the affected arm for 24 hours or as directed by your health care provider.  Do not push or pull heavy objects with the affected arm for 24 hours or as directed by your health care provider.  Do not lift over 10 lb (4.5 kg) for 5 days after your procedure or as directed by your health care provider.  Ask your health care provider when it is okay to:  Return to work or school.  Resume usual physical activities or sports.  Resume sexual activity.  Do not drive home if you are  discharged the same day as the procedure. Have someone else drive you.  You may drive 24 hours after the procedure unless otherwise instructed by your health care provider.  Do not operate machinery or power tools for 24 hours after the procedure.  If your procedure was done as an outpatient procedure, which means that you went home the same day as your procedure, a responsible adult should be with you for the first 24 hours after you arrive home.  Keep all follow-up visits as directed by your health care provider. This is important. Contact a health care provider if:  You have a fever.  You have chills.  You have increased bleeding from the radial site. Hold pressure on the site. CALL 911 Get help right away if:  You have unusual pain at the radial site.  You have redness, warmth, or swelling at the radial site.  You have drainage (other than a small amount of blood on the dressing) from the radial site.  The radial site is bleeding, and the bleeding does not stop after 30 minutes of holding steady pressure on the site.  Your arm or hand becomes pale, cool, tingly, or numb. This information is not intended to replace advice given to you by your health care provider. Make sure you discuss any questions you have with your health care provider. Document Released: 12/03/2010 Document Revised: 04/07/2016 Document Reviewed: 05/19/2014 Elsevier Interactive Patient Education  2017 [employer] Femoral Site Care Refer to this sheet  in the next few weeks. These instructions provide you with information about caring for yourself after your procedure. Your health care provider may also give you more specific instructions. Your treatment has been planned according to current medical practices, but problems sometimes occur. Call your health care provider if you have any problems or questions after your procedure. What can I expect after the procedure? After your procedure, it is typical to have  the following:  Bruising at the site that usually fades within 1-2 weeks.  Blood collecting in the tissue (hematoma) that may be painful to the touch. It should usually decrease in size and tenderness within 1-2 weeks. Follow these instructions at home:  Take medicines only as directed by your health care provider.  You may shower 24-48 hours after the procedure or as directed by your health care provider. Remove the bandage (dressing) and gently wash the site with plain soap and water. Pat the area dry with a clean towel. Do not rub the site, because this may cause bleeding.  Do not take baths, swim, or use a hot tub until your health care provider approves.  Check your insertion site every day for redness, swelling, or drainage.  Do not apply powder or lotion to the site.  Limit use of stairs to twice a day for the first 2-3 days or as directed by your health care provider.  Do not squat for the first 2-3 days or as directed by your health care provider.  Do not lift over 10 lb (4.5 kg) for 5 days after your procedure or as directed by your health care provider.  Ask your health care provider when it is okay to:  Return to work or school.  Resume usual physical activities or sports.  Resume sexual activity.  Do not drive home if you are discharged the same day as the procedure. Have someone else drive you.  You may drive 24 hours after the procedure unless otherwise instructed by your health care provider.  Do not operate machinery or power tools for 24 hours after the procedure or as directed by your health care provider.  If your procedure was done as an outpatient procedure, which means that you went home the same day as your procedure, a responsible adult should be with you for the first 24 hours after you arrive home.  Keep all follow-up visits as directed by your health care provider. This is important. Contact a health care provider if:  You have a fever.  You  have chills.  You have increased bleeding from the site. Hold pressure on the site. Get help right away if:  You have unusual pain at the site.  You have redness, warmth, or swelling at the site.  You have drainage (other than a small amount of blood on the dressing) from the site.  The site is bleeding, and the bleeding does not stop after 30 minutes of holding steady pressure on the site.  Your leg or foot becomes pale, cool, tingly, or numb. This information is not intended to replace advice given to you by your health care provider. Make sure you discuss any questions you have with your health care provider. Document Released: 07/04/2014 Document Revised: 04/07/2016 Document Reviewed: 05/20/2014 Elsevier Interactive Patient Education  2017 [employer].

## 2017-03-29 NOTE — Progress Notes (Signed)
Dr. Excell Seltzerooper called me to discuss patient - recommends he start Lasix 40mg  daily and potassium 10meq daily (ok to start tomorrow), rx sent in, also reviewed with short stay nurse to make sure AVS gets re-printed. Yoshio Seliga PA-C

## 2017-03-29 NOTE — Interval H&P Note (Signed)
History and Physical Interval Note:  03/29/2017 3:12 PM  Clifford ShamWilliam T Clites  has presented today for surgery, with the diagnosis of arotic stenosis - angina - cp  The various methods of treatment have been discussed with the patient and family. After consideration of risks, benefits and other options for treatment, the patient has consented to  Procedure(s): Right/Left Heart Cath and Coronary Angiography (N/A) as a surgical intervention .  The patient's history has been reviewed, patient examined, no change in status, stable for surgery.  I have reviewed the patient's chart and labs.  Questions were answered to the patient's satisfaction.     Tonny Bollmanooper, Ailie Gage

## 2017-03-29 NOTE — H&P (View-Only) (Signed)
Cardiology Office Note  Date: 03/21/2017   ID: Clifford ShamWilliam T Grewe, DOB 1941-07-21, MRN [id number]  PCP: Chana BodeFatade, Ayokunle, DO  Primary Cardiologist: Nona DellSamuel Kimimila Tauzin, MD   Chief Complaint  Patient presents with  . Atrial Fibrillation  . Coronary Artery Disease    History of Present Illness: Clifford King is a 76 y.o. male last seen in April. He presents for a follow-up visit. We did increase his Toprol-XL for further heart rate control of atrial fibrillation.He states that this has not made any significant difference in his symptoms. Still reporting exertional fatigue and breathlessness, tingling in his left arm and left chest. He has not had any syncope.  Current cardiac medications are outlined below. He reports compliance with therapy.  He continues on Coumadin with follow-up in the anticoagulation clinic. He does not report any bleeding episodes.  Today I reviewed his recent noninvasive studies again and we went over the results. Myoview study is most consistent with inferior scar. He does have known occlusion of a small nondominant RCA that was not bypassed. There were no ischemic anterior or lateral defects. I personally reviewed his echocardiogram images today and would grade his LVEF closer to 40%. He also looks to have fairly significant aortic stenosis, higher end of moderate to severe range. 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  Past Medical History:  Diagnosis Date  . Atrial fibrillation (HCC)   . Carotid artery disease (HCC)    Nonobstructive  . Coronary atherosclerosis of native coronary artery    Multivessel, LVEF 60%, occluded small nondominant RCA  (not bypassed)  . Essential hypertension, benign   . Mixed hyperlipidemia   . Morbid obesity (HCC)     Past Surgical History:  Procedure Laterality Date  . CORONARY ARTERY BYPASS GRAFT  2004   Off-pump LIMA to LAD, SVG to diagonal     Current Outpatient Prescriptions  Medication Sig Dispense Refill  . allopurinol (ZYLOPRIM) 100 MG tablet Take 100 mg by mouth daily.      Marland Kitchen. amLODipine (NORVASC) 10 MG tablet TAKE 1 TABLET BY MOUTH DAILY 90 tablet 3  . aspirin 81 MG tablet Take 81 mg by mouth daily.      Marland Kitchen. lisinopril (PRINIVIL,ZESTRIL) 20 MG tablet Take 20 mg by mouth daily.      . metoprolol succinate (TOPROL-XL) 50 MG 24 hr tablet Take 1 & 1/2 tablets daily 135 tablet 1  . Nystatin (NYAMYC) 100000 UNIT/GM POWD as needed.     . simvastatin (ZOCOR) 10 MG tablet TAKE 1 TABLET BY MOUTH AT BEDTIME 90 tablet 3  . warfarin (COUMADIN) 5 MG tablet TAKE AS DIRECTED BY COUMADIN CLINIC 180 tablet 0   No current facility-administered medications for this visit.    Allergies:  Vicodin [hydrocodone-acetaminophen]   Social History: The patient  reports that he quit smoking about 30 years ago. His smoking use included Cigars. He started smoking about 40 years ago. He has a 5.00 pack-year smoking history. He has never used smokeless tobacco. He reports that he does not drink alcohol or use drugs.   Family History: The patient's family history includes Cancer in his other; Stroke in his other.   ROS:  Please see the history of present illness. Otherwise, complete review of systems is positive for progressive fatigue.  All other systems are reviewed and negative.   Physical Exam: VS:  BP 132/73   Pulse 63   Ht 6\' 5"  (1.956 m)  Wt 277 lb (125.6 kg)   SpO2 96% Comment: on room air  BMI 32.85 kg/m , BMI Body mass index is 32.85 kg/m.  Wt Readings from Last 3 Encounters:  03/21/17 277 lb (125.6 kg)  02/24/17 273 lb (123.8 kg)  02/10/17 277 lb (125.6 kg)    General: Obese male, appearscomfortable at rest. HEENT: Conjunctiva and lids normal, oropharynx clear with moist mucosa. Neck: Supple, no elevated JVP or carotid bruits, no thyromegaly. Lungs: Clear to auscultation, nonlabored breathing at rest. Cardiac: Irregularly  irregular, no S3, 2-3/6 systolic murmur, no pericardial rub. Abdomen: Soft, nontender, bowel sounds present, no guarding or rebound. Extremities: No pitting edema, distal pulses 2+. Skin: Warm and dry. Musculoskeletal: No kyphosis. Neuropsychiatric: Alert and oriented 3, affect appropriate.  ECG: I personally reviewed the tracing from 02/10/2017 which showed rate-controlled atrial fibrillation with aberrantly conducted complexes versus PVCs and left bundle branch block.  Recent Labwork:  October 2015: BUN 13, creatinine 0.8, potassium 3.8, AST 25, ALT 25, cholesterol 134, triglycerides 112, HDL 33, LDL 79  Other Studies Reviewed Today:  Echocardiogram 02/21/2017: Study Conclusions  - Left ventricle: The cavity size was normal. There was mild concentric hypertrophy. Systolic function was low normal to mildly reduced. The estimated ejection fraction was in the range of 45% to 50%. Diffuse hypokinesis. The study was not technically sufficient to allow evaluation of LV diastolic dysfunction due to atrial fibrillation. - Aortic valve: Peak velocity (S): 415 cm/s. Mean gradient (S): 41 mm Hg. Valve area (VTI): 0.6 cm^2. Valve area (Vmax): 0.59 cm^2. Valve area (Vmean): 0.59 cm^2. - Mitral valve: Mildly calcified annulus. There was mild regurgitation. - Left atrium: The atrium was severely dilated. - Right ventricle: Systolic function was mildly reduced. - Right atrium: The atrium was mildly dilated. - Pulmonary arteries: PA peak pressure: 40 mm Hg (S).  Lexiscan Myoview 02/21/2017:  Defect 1: There is a medium defect of mild severity present in the mid inferoseptal, mid inferior and apical inferior location.  Findings consistent with prior myocardial infarction. No ischemic territories.  This is an intermediate risk study.  Nuclear stress EF: 39%.  Assessment and Plan:  1. Progressive fatigue, dyspnea on exertion, left arm and chest tingling. Angina certainly a  possibility although recent Myoview indicates inferior scar which would fit with known occlusion of a small nondominant RCA that was not bypassed. He did not have any large anterior or lateral defects, has history of LIMA to the LAD and SVG to the diagonal with off pump bypass. I increased his beta blocker to see if further heart rate control of atrial fibrillation would help and it has not. Of additional concern is what looks to be fairly significant aortic stenosis based on my review of his echocardiographic images from April. Plan at this time is to schedule a left and right heart catheterization to clarify coronary and bypass graft anatomy, also further assess aortic valve. He may actually need to be considered for AVR versus TAVR. He will come off of Coumadin for heart catheterization, we will obtain baseline blood work.  2. CAD status post off-pump CABG in 2004 with LIMA to LAD and SVG to the diagonal. He has a small nondominant RCA that is occluded and was not bypassed.  3. Chronic atrial fibrillation being managed with strategy of heart rate control and anticoagulation. He has a severely dilated left atrium. Heart rate is adequately controlled at this time.  4. Cardiomyopathy, LVEF looks to be around 40% by recent imaging studies. Question  whether this could be related to progressive aortic stenosis. Also need to exclude worsening CAD or graft disease. Heart catheterization being pursued as outlined above.  5. Essential hypertension, blood pressure control has been adequate.  Current medicines were reviewed with the patient today.   Orders Placed This Encounter  Procedures  . DG Chest 2 View  . CBC  . Basic metabolic panel  . Protime-INR  . APTT     Disposition: Follow-up after cardiac catheterization.  Signed, Jonelle Sidle, MD, [facility] 03/21/2017 8:52 AM    [facility] [address] [place], [place], [place] [address] Phone: [phone]; Fax: [phone]

## 2017-03-30 ENCOUNTER — Encounter (HOSPITAL_COMMUNITY): Payer: Self-pay | Admitting: Cardiovascular Disease

## 2017-03-30 ENCOUNTER — Other Ambulatory Visit: Payer: Self-pay | Admitting: *Deleted

## 2017-03-30 DIAGNOSIS — I350 Nonrheumatic aortic (valve) stenosis: Secondary | ICD-10-CM

## 2017-04-03 ENCOUNTER — Telehealth: Payer: Self-pay

## 2017-04-03 NOTE — Telephone Encounter (Signed)
I spoke with the pt and he states that he took 4 doses of Furosemide and potassium and he decided to stop the medication today.  The pt complains of sharp cramping pains in his feet and legs. The pt states he took these medications 2 years ago and had similar symptoms.  The pt denies any swelling at this time.  I advised the pt that I will forward this information to Dr Excell Seltzerooper for review and further recommendations. Pt agreed with plan.

## 2017-04-04 NOTE — Telephone Encounter (Signed)
Attempted to reach the pt but he has a voicemail box that has not been set up.  I spoke with Dr Excell Seltzerooper and he is okay with pt stopping Furosemide and potassium due to symptoms.

## 2017-04-05 NOTE — Telephone Encounter (Signed)
I spoke with the pt and made him aware that per Dr Excell Seltzerooper he can stop lasix and potassium.  The pt still has pain in his legs and I advised him that since he has been off these medications the pain may not be coming from the medications. If the pt's pain has not resolved by the end of the week I advised the pt to restart these medications and follow-up with PCP for leg pain.

## 2017-04-06 ENCOUNTER — Ambulatory Visit: Payer: Medicare Other | Attending: Cardiovascular Disease | Admitting: Physical Therapy

## 2017-04-06 ENCOUNTER — Ambulatory Visit (HOSPITAL_COMMUNITY)
Admission: RE | Admit: 2017-04-06 | Discharge: 2017-04-06 | Disposition: A | Discharge status: Home / Self Care | Payer: Medicare Other | Source: Ambulatory Visit | Attending: Cardiovascular Disease | Admitting: Cardiovascular Disease

## 2017-04-06 ENCOUNTER — Encounter: Payer: Self-pay | Admitting: Physical Therapy

## 2017-04-06 ENCOUNTER — Encounter (HOSPITAL_COMMUNITY): Payer: Self-pay

## 2017-04-06 ENCOUNTER — Ambulatory Visit (HOSPITAL_COMMUNITY): Payer: Medicare Other

## 2017-04-06 DIAGNOSIS — K8689 Other specified diseases of pancreas: Secondary | ICD-10-CM

## 2017-04-06 DIAGNOSIS — I517 Cardiomegaly: Secondary | ICD-10-CM | POA: Insufficient documentation

## 2017-04-06 DIAGNOSIS — I350 Nonrheumatic aortic (valve) stenosis: Secondary | ICD-10-CM

## 2017-04-06 DIAGNOSIS — R590 Localized enlarged lymph nodes: Secondary | ICD-10-CM | POA: Diagnosis not present

## 2017-04-06 DIAGNOSIS — R2689 Other abnormalities of gait and mobility: Secondary | ICD-10-CM

## 2017-04-06 DIAGNOSIS — Z951 Presence of aortocoronary bypass graft: Secondary | ICD-10-CM | POA: Insufficient documentation

## 2017-04-06 DIAGNOSIS — I700 Atherosclerosis of aorta: Secondary | ICD-10-CM | POA: Insufficient documentation

## 2017-04-06 DIAGNOSIS — K869 Disease of pancreas, unspecified: Secondary | ICD-10-CM | POA: Diagnosis not present

## 2017-04-06 DIAGNOSIS — I2510 Atherosclerotic heart disease of native coronary artery without angina pectoris: Secondary | ICD-10-CM | POA: Diagnosis not present

## 2017-04-06 DIAGNOSIS — I513 Intracardiac thrombosis, not elsewhere classified: Secondary | ICD-10-CM | POA: Insufficient documentation

## 2017-04-06 HISTORY — DX: Other specified diseases of pancreas: K86.89

## 2017-04-06 LAB — PULMONARY FUNCTION TEST
DL/VA % PRED: 100 %
DL/VA: 4.96 ml/min/mmHg/L
DLCO unc % pred: 72 %
DLCO unc: 30.21 ml/min/mmHg
FEF 25-75 POST: 2.87 L/s
FEF 25-75 Pre: 2.23 L/sec
FEF2575-%Change-Post: 28 %
FEF2575-%PRED-PRE: 75 %
FEF2575-%Pred-Post: 96 %
FEV1-%CHANGE-POST: 6 %
FEV1-%PRED-PRE: 66 %
FEV1-%Pred-Post: 70 %
FEV1-PRE: 2.67 L
FEV1-Post: 2.84 L
FEV1FVC-%CHANGE-POST: 6 %
FEV1FVC-%PRED-PRE: 104 %
FEV6-%Change-Post: -1 %
FEV6-%Pred-Post: 66 %
FEV6-%Pred-Pre: 67 %
FEV6-POST: 3.46 L
FEV6-Pre: 3.5 L
FEV6FVC-%Change-Post: 0 %
FEV6FVC-%Pred-Post: 105 %
FEV6FVC-%Pred-Pre: 105 %
FVC-%Change-Post: 0 %
FVC-%PRED-PRE: 63 %
FVC-%Pred-Post: 63 %
FVC-POST: 3.5 L
FVC-PRE: 3.5 L
PRE FEV1/FVC RATIO: 76 %
PRE FEV6/FVC RATIO: 100 %
Post FEV1/FVC ratio: 81 %
Post FEV6/FVC ratio: 100 %
RV % pred: 103 %
RV: 3.02 L
TLC % PRED: 77 %
TLC: 6.55 L

## 2017-04-06 MED ORDER — IOPAMIDOL (ISOVUE-370) INJECTION 76%
INTRAVENOUS | Status: AC
  Administered 2017-04-06: 100 mL
  Filled 2017-04-06: qty 100

## 2017-04-06 MED ORDER — IOPAMIDOL (ISOVUE-370) INJECTION 76%
INTRAVENOUS | Status: AC
  Administered 2017-04-06: 50 mL
  Filled 2017-04-06: qty 50

## 2017-04-06 MED ORDER — ALBUTEROL SULFATE (2.5 MG/3ML) 0.083% IN NEBU
2.5000 mg | INHALATION_SOLUTION | Freq: Once | RESPIRATORY_TRACT | Status: AC
  Administered 2017-04-06: 2.5 mg via RESPIRATORY_TRACT

## 2017-04-06 NOTE — Therapy (Signed)
[facility] [address] [place], [place], [address] Phone: [phone]   Fax:  [phone]  Patient Details  Name: Clifford ShamWilliam T Art MRN: [id number] Date of Birth: 1940/11/30 Referring Provider:  Tonny Bollmanooper, Michael, MD  Encounter Date: 04/06/2017  Pt arrived for PT eval and reported he had stepped wrong on his L foot on Sunday. He reported hearing a pop and felt the foot "drop". Upon examination today, there was obvious swelling and discoloration along medial foot and calf. Pt was very point tender at distal posterior calf. Pt was unable to flex any of his toes or maintain great toe flexion when placed there. Pt reports he has decreased sensation to that foot since Sunday and it feels colder to him. He reports he was hesitant to report it because he didn't want this to interfere with his TAVR procedure. Called and spoke with Darius Bumpyan Brooks to report findings and plans to hold PT eval until examined by MD. Pt planned to go to Urgent Care or Orthopedic MD closer to home to be evaluated. Will complete PT eval on future date.   Rancho MurietaNICOLETTA,Felisa Zechman, PT 04/06/2017, 1:45 PM  [facility] [address] [place], [place], [address] Phone: [phone]   Fax:  [phone]

## 2017-04-07 ENCOUNTER — Ambulatory Visit (INDEPENDENT_AMBULATORY_CARE_PROVIDER_SITE_OTHER): Payer: Medicare Other | Admitting: *Deleted

## 2017-04-07 ENCOUNTER — Telehealth: Payer: Self-pay | Admitting: Cardiology

## 2017-04-07 DIAGNOSIS — I4891 Unspecified atrial fibrillation: Secondary | ICD-10-CM | POA: Diagnosis not present

## 2017-04-07 DIAGNOSIS — Z5181 Encounter for therapeutic drug level monitoring: Secondary | ICD-10-CM | POA: Diagnosis not present

## 2017-04-07 DIAGNOSIS — I25119 Atherosclerotic heart disease of native coronary artery with unspecified angina pectoris: Secondary | ICD-10-CM

## 2017-04-07 DIAGNOSIS — Z7901 Long term (current) use of anticoagulants: Secondary | ICD-10-CM | POA: Diagnosis not present

## 2017-04-07 LAB — POCT INR: INR: 2.2

## 2017-04-07 NOTE — Telephone Encounter (Signed)
Pt aware of upcoming appts with Dr Tyrone SageGerhardt on 5/31 and anti coag visit 6/7 - pt was not aware of upcoming anti coag visit.

## 2017-04-07 NOTE — Telephone Encounter (Signed)
[name] called stating that he received a telephone call from Dr. Excell Seltzerooper on Thursday evening. He stated that Dr. Excell Seltzerooper told him he would contact Dr. Diona BrownerMcDowell about his coumdin.  [name] is Concerned about upcoming procedure and blood clots.  [phone] or [phone].

## 2017-04-07 NOTE — Telephone Encounter (Signed)
[facility] Chip Boer(Vicki)  and they advised for the pt to just walk-in to the office this afternoon for an INR check. The pt had already contacted the office earlier today but they were not aware of conversation between Dr Excell Seltzerooper and Dr Diona BrownerMcDowell. No TEE needed at this time. I spoke with the pt and he will go to the office after he picks up his wife at her hair appointment.

## 2017-04-12 ENCOUNTER — Encounter: Payer: Medicare Other | Admitting: Cardiothoracic Surgery

## 2017-04-12 ENCOUNTER — Encounter: Payer: Self-pay | Admitting: Thoracic Surgery (Cardiothoracic Vascular Surgery)

## 2017-04-12 ENCOUNTER — Institutional Professional Consult (permissible substitution) (INDEPENDENT_AMBULATORY_CARE_PROVIDER_SITE_OTHER): Payer: Medicare Other | Admitting: Thoracic Surgery (Cardiothoracic Vascular Surgery)

## 2017-04-12 VITALS — BP 125/68 | HR 67 | Resp 16 | Ht 77.0 in | Wt 264.2 lb

## 2017-04-12 DIAGNOSIS — I350 Nonrheumatic aortic (valve) stenosis: Secondary | ICD-10-CM | POA: Diagnosis not present

## 2017-04-12 DIAGNOSIS — Z951 Presence of aortocoronary bypass graft: Secondary | ICD-10-CM | POA: Diagnosis not present

## 2017-04-12 DIAGNOSIS — I447 Left bundle-branch block, unspecified: Secondary | ICD-10-CM | POA: Insufficient documentation

## 2017-04-12 DIAGNOSIS — I25119 Atherosclerotic heart disease of native coronary artery with unspecified angina pectoris: Secondary | ICD-10-CM | POA: Diagnosis not present

## 2017-04-12 NOTE — Patient Instructions (Signed)
Continue all previous medications without any changes at this time  

## 2017-04-12 NOTE — Progress Notes (Signed)
[facility]  MULTIDISCIPLINARY HEART VALVE CLINIC  CARDIOTHORACIC SURGERY CONSULTATION REPORT  Referring Provider is Jonelle Sidle, MD PCP is Chana Bode, DO  Chief Complaint  Patient presents with  . Aortic Stenosis    severe..eval for TAVR VS OPEN...all studies are complete except for exercise due to left heel spurs    HPI:  Patient is a 76 year old male with history of coronary artery disease status post coronary artery bypass grafting in 2004, aortic stenosis, long standing persistent atrial fibrillation on warfarin anticoagulation, left bundle branch block, hyperlipidemia, peripheral vascular disease, chronic venous insufficiency, instability of gait with mildly reduced mobility, remote history of tobacco use, and degenerative disease of the cervical spine with remote history of injury to the neck who has been referred for surgical consultation to discuss treatment options for management of severe symptomatic aortic stenosis. The patient's cardiac history dates back nearly 15 years ago when the patient presented with angina pectoris. He was found to have single-vessel coronary artery disease and underwent PCI and stenting of the left anterior descending coronary artery. He developed restenosis requiring multiple interventions, and eventually he underwent off-pump coronary artery bypass grafting 2 by Dr. Tyrone Sage in 2004. Grafts placed at the time of surgery included left internal mammary artery to the distal left anterior descending coronary artery and saphenous vein graft to the diagonal branch. More recently the patient developed persistent atrial fibrillation which failed an attempt at DC cardioversion, and he has been chronically anticoagulated using warfarin for several years. He has known of the presence of a heart murmur for least 10 years, and recently he has been followed by Dr. Diona Browner with serial echocardiograms. Over the past 6-12 months the patient  complains of progressive symptoms of exertional shortness of breath, chest tightness, and fatigue. He now gets short of breath with very mild activity and occasionally at rest. He reports episodes of PND and dizzy spells without syncope. He has chronic lower extremity edema.  He was seen in follow-up by Dr. Diona Browner and repeat echocardiogram performed 02/21/2017 revealed severe aortic stenosis. Peak velocity across the aortic valve measured nearly 4.2 m/s corresponding to mean transvalvular gradient estimated 39 mmHg. The DVI was quite low at 0.19.  Left ventricular systolic function was mildly reduced with ejection fraction estimated 45-50%.  The patient was referred to the multidisciplinary heart valve clinic and underwent left and right heart catheterization by Dr. Excell Seltzer on 03/29/2017. Catheterization confirmed the presence of severe aortic stenosis with mean transvalvular gradient measured 43 mmHg by catheterization and aortic valve area calculated 0.92 cm.  The patient was noted to have severe multivessel coronary artery disease with 100% chronic occlusion of the left anterior descending coronary artery but continued patency of the left internal mammary artery and saphenous vein grafts. There was moderate disease involving a small nondominant right coronary artery.  Pulmonary artery pressures were moderately elevated. The patient subsequently underwent CT angiography and was referred for surgical consultation. Of note, CT angiogram of the chest, abdomen and pelvis was reported to be concerning for the possibility of a small clot in the distal portion of left atrial appendage as well as a small benign-appearing low-attenuation lesion in the pancreas. However, cardiac gated CT angiogram of the heart was not suggestive of left atrial thrombus. Since that time the patient's prothrombin time has been checked and he remains therapeutic on warfarin.  The patient is married and lives with his wife in [place]  [place]. 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  He has remained reasonably active physically in retirement although he complains that recently he has been severely limited by progressive exertional shortness of breath, chest tightness, and fatigue. He also is limited by instability of gait with chronic pain in his left foot related to heel spurs. He has some poor balance and difficulty walking. In 2011 he suffered a fall and broke one of the vertebra in his neck, and he now has limited mobility of the cervical spine.  Past Medical History:  Diagnosis Date  . Atrial fibrillation (HCC)   . Carotid artery disease (HCC)    Nonobstructive  . Coronary atherosclerosis of native coronary artery    Multivessel, LVEF 60%, occluded small nondominant RCA  (not bypassed)  . Essential hypertension, benign   . LBBB (left bundle branch block)   . Longstanding persistent atrial fibrillation (HCC)        . Mixed hyperlipidemia   . Morbid obesity (HCC)   . Pancreatic mass 04/06/2017   low attenuation lesion - needs f/u imaging 2 years  . 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 Severe aortic stenosis     Past Surgical History:  Procedure Laterality Date  . CORONARY ARTERY BYPASS GRAFT  02/17/2003   Off-pump LIMA to LAD, SVG to diagonal - Dr Tyrone Sage  . RIGHT/LEFT HEART CATH AND CORONARY ANGIOGRAPHY N/A 03/29/2017   Procedure: Right/Left Heart Cath and Coronary Angiography;  Surgeon: Tonny Bollman, MD;  Location: [facility] INVASIVE CV LAB;  Service: Cardiovascular;  Laterality: N/A;    Family History  Problem Relation Age of Onset  . Stroke Other        family h/o  . Cancer Other        family h/o    Social History   Social History  . Marital status: Married    Spouse name: N/A  . Number of children: N/A  . Years of education: N/A   Occupational History  . Retired     Disabled  .  Retired   Social History Main Topics  . Smoking status: Former  Smoker    Packs/day: 0.50    Years: 10.00    Types: Cigars    Start date: 11/14/1976    Quit date: 11/14/1986  . Smokeless tobacco: Never Used  . Alcohol use No  . Drug use: No  . Sexual activity: Not on file   Other Topics Concern  . Not on file   Social History Narrative  . No narrative on file    Current Outpatient Prescriptions  Medication Sig Dispense Refill  . allopurinol (ZYLOPRIM) 100 MG tablet Take 100 mg by mouth daily.      Marland Kitchen amLODipine (NORVASC) 10 MG tablet TAKE 1 TABLET BY MOUTH DAILY 90 tablet 3  . aspirin 81 MG tablet Take 81 mg by mouth daily.      Marland Kitchen lisinopril (PRINIVIL,ZESTRIL) 20 MG tablet Take 20 mg by mouth daily.      . metoprolol succinate (TOPROL-XL) 50 MG 24 hr tablet Take 1 & 1/2 tablets daily 135 tablet 1  . Nystatin (NYAMYC) 100000 UNIT/GM POWD Apply topically daily as needed (infections).     . simvastatin (ZOCOR) 10 MG tablet TAKE 1 TABLET BY MOUTH AT BEDTIME 90 tablet 3  . warfarin (COUMADIN) 5 MG tablet TAKE AS DIRECTED BY COUMADIN CLINIC (Patient taking differently: Take 5-10 mg by mouth See admin instructions. Takes 5 mg (1 tab) on  Monday and Friday, all other days (sun, tues, wed, thurs and Saturday) takes 2 (5mg ) tablets =10mg ) 180 tablet 0  . furosemide (LASIX) 40 MG tablet Take 1 tablet (40 mg total) by mouth daily. (Patient not taking: Reported on 04/06/2017) 30 tablet 1  . potassium chloride (K-DUR) 10 MEQ tablet Take 1 tablet (10 mEq total) by mouth daily. (Patient not taking: Reported on 04/12/2017) 30 tablet 1   No current facility-administered medications for this visit.     Allergies  Allergen Reactions  . Vicodin [Hydrocodone-Acetaminophen] Other (See Comments)    hallucinations      Review of Systems:   General:  normal appetite, decreased energy, no weight gain, no weight loss, no fever  Cardiac:  + chest pain with exertion, no chest pain at rest, + SOB with exertion, + occasional resting SOB, + PND, no orthopnea, no  palpitations, + arrhythmia, + atrial fibrillation, + LE edema, + dizzy spells, no syncope  Respiratory:  + shortness of breath, no home oxygen, no productive cough, no dry cough, no bronchitis, no wheezing, no hemoptysis, no asthma, no pain with inspiration or cough, no sleep apnea, no CPAP at night  GI:   no difficulty swallowing, no reflux, no frequent heartburn, no hiatal hernia, no abdominal pain, no constipation, no diarrhea, no hematochezia, no hematemesis, no melena  GU:   no dysuria,  no frequency, no urinary tract infection, no hematuria, no enlarged prostate, no kidney stones, no kidney disease  Vascular:  no pain suggestive of claudication, + pain in feet, no leg cramps, + varicose veins, + DVT, no non-healing foot ulcer  Neuro:   no stroke, no TIA's, no seizures, no headaches, no temporary blindness one eye,  no slurred speech, no peripheral neuropathy, + mild chronic pain, + mild instability of gait, + mild memory/cognitive dysfunction  Musculoskeletal: + arthritis, no joint swelling, no myalgias, + mild difficulty walking, decreased mobility   Skin:   no rash, no itching, no skin infections, no pressure sores or ulcerations  Psych:   no anxiety, no depression, no nervousness, no unusual recent stress  Eyes:   + blurry vision, no floaters, no recent vision changes, + wears glasses or contacts  ENT:   no hearing loss, no loose or painful teeth, no dentures, last saw dentist 2 years ago  Hematologic:  + easy bruising, no abnormal bleeding, no clotting disorder, no frequent epistaxis  Endocrine:  no diabetes, does not check CBG's at home           Physical Exam:   BP 125/68 (BP Location: Right Arm, Patient Position: Sitting, Cuff Size: Large)   Pulse 67   Resp 16   Ht 6\' 5"  (1.956 m)   Wt 264 lb 3.2 oz (119.8 kg)   SpO2 95% Comment: ON RA  BMI 31.33 kg/m   General:  Moderately obese,  well-appearing  HEENT:  Unremarkable   Neck:   no JVD, no bruits, no adenopathy    Chest:   clear to auscultation, symmetrical breath sounds, no wheezes, no rhonchi   CV:   RRR, grade III/VI crescendo/decrescendo murmur heard best at RSB,  no diastolic murmur  Abdomen:  soft, non-tender, no masses   Extremities:  warm, well-perfused, pulses diminished but palpable, + LE edema, + severe varicose veins RLE  Rectal/GU  Deferred  Neuro:   Grossly non-focal and symmetrical throughout  Skin:   Clean and dry, + chronic venous stasis both lower legs R>>L but no breakdown  Diagnostic Tests:  Transthoracic Echocardiography  Patient:    Calob, Baskette MR #:       [id number] Study Date: 02/21/2017 Gender:     M Age:        64 Height:     195.6 cm Weight:     125.6 kg BSA:        2.64 m^2 Pt. Status: Room:   ATTENDING    Nona Dell, M.D.  ORDERING     Nona Dell, M.D.  REFERRING    Nona Dell, M.D.  SONOGRAPHER  [facility]  PERFORMING   [facility]  cc:  ------------------------------------------------------------------- LV EF: 45% -   50%  ------------------------------------------------------------------- Indications:      CAD of native vessels 414.01.  ------------------------------------------------------------------- History:   PMH:  Former Smoker.  Atrial fibrillation.  Coronary artery disease.  Risk factors:  Hypertension. Dyslipidemia.  ------------------------------------------------------------------- Study Conclusions  - Left ventricle: The cavity size was normal. There was mild   concentric hypertrophy. Systolic function was low normal to   mildly reduced. The estimated ejection fraction was in the range   of 45% to 50%. Diffuse hypokinesis. The study was not technically   sufficient to allow evaluation of LV diastolic dysfunction due to   atrial fibrillation. - Aortic valve: Peak velocity (S): 415 cm/s. Mean gradient (S): 41   mm Hg. Valve area (VTI): 0.6 cm^2. Valve area (Vmax): 0.59 cm^2.   Valve area  (Vmean): 0.59 cm^2. - Mitral valve: Mildly calcified annulus. There was mild   regurgitation. - Left atrium: The atrium was severely dilated. - Right ventricle: Systolic function was mildly reduced. - Right atrium: The atrium was mildly dilated. - Pulmonary arteries: PA peak pressure: 40 mm Hg (S).  ------------------------------------------------------------------- Study data:  No prior study was available for comparison.  Study status:  Routine.  Procedure:  Transthoracic echocardiography. Image quality was adequate.          Transthoracic echocardiography.  M-mode, complete 2D, spectral Doppler, and color Doppler.  Birthdate:  Patient birthdate: 1940-12-07.  Age:  Patient is 76 yr old.  Sex:  Gender: male.    BMI: 32.8 kg/m^2.  Patient status:  Outpatient.  Study date:  Study date: 02/21/2017. Study time: 09:54 AM.  Location:  Echo laboratory.  -------------------------------------------------------------------  ------------------------------------------------------------------- Left ventricle:  The cavity size was normal. There was mild concentric hypertrophy. Systolic function was low normal to mildly reduced. The estimated ejection fraction was in the range of 45% to 50%. Diffuse hypokinesis. The study was not technically sufficient to allow evaluation of LV diastolic dysfunction due to atrial fibrillation. There was no evidence of elevated ventricular filling pressure by Doppler parameters.  ------------------------------------------------------------------- Aortic valve:   Severely calcified leaflets.  Doppler:  There was no regurgitation.    VTI ratio of LVOT to aortic valve: 0.19. Valve area (VTI): 0.6 cm^2. Indexed valve area (VTI): 0.23 cm^2/m^2. Peak velocity ratio of LVOT to aortic valve: 0.19. Valve area (Vmax): 0.59 cm^2. Indexed valve area (Vmax): 0.22 cm^2/m^2. Mean velocity ratio of LVOT to aortic valve: 0.19. Valve area (Vmean): 0.59 cm^2. Indexed valve  area (Vmean): 0.22 cm^2/m^2.    Mean gradient (S): 41 mm Hg. Peak gradient (S): 69 mm Hg.  ------------------------------------------------------------------- Aorta:  Aortic root: The aortic root was normal in size.  ------------------------------------------------------------------- Mitral valve:   Mildly calcified annulus. Leaflet separation was normal.  Doppler:  Transvalvular velocity was within the normal range. There was no evidence for stenosis. There was mild regurgitation.  Peak gradient (D): 5 mm Hg.  ------------------------------------------------------------------- Left atrium:  The atrium was severely dilated.  ------------------------------------------------------------------- Right ventricle:  The cavity size was normal. Systolic function was mildly reduced.  ------------------------------------------------------------------- Pulmonic valve:   Poorly visualized.  Doppler:  There was no significant regurgitation.  ------------------------------------------------------------------- Tricuspid valve:   The valve appears to be grossly normal. Doppler:  There was trivial regurgitation.  ------------------------------------------------------------------- Right atrium:  The atrium was mildly dilated.  ------------------------------------------------------------------- Pericardium:  Not well visualized.  ------------------------------------------------------------------- Systemic veins: Inferior vena cava: The vessel was dilated. The respirophasic diameter changes were blunted (< 50%), consistent with elevated central venous pressure.  ------------------------------------------------------------------- Measurements   Left ventricle                           Value          Reference  LV ID, ED, PLAX chordal          (H)     56.7  mm       43 - 52  LV ID, ES, PLAX chordal          (H)     45.6  mm       23 - 38  LV fx shortening, PLAX chordal   (L)      20    %        >=29  LV PW thickness, ED                      13.4  mm       ----------  IVS/LV PW ratio, ED                      0.86           <=1.3  Stroke volume, 2D                        70    ml       ----------  Stroke volume/bsa, 2D                    26    ml/m^2   ----------  LV e&', lateral                           8.38  cm/s     ----------  LV E/e&', lateral                         13.37          ----------  LV e&', medial                            7.4   cm/s     ----------  LV E/e&', medial                          15.14          ----------  LV e&', average                           7.89  cm/s     ----------  LV E/e&', average  14.2           ----------    Ventricular septum                       Value          Reference  IVS thickness, ED                        11.5  mm       ----------    LVOT                                     Value          Reference  LVOT ID, S                               20    mm       ----------  LVOT area                                3.14  cm^2     ----------  LVOT peak velocity, S                    78.1  cm/s     ----------  LVOT mean velocity, S                    54.5  cm/s     ----------  LVOT VTI, S                              22.3  cm       ----------  LVOT peak gradient, S                    2     mm Hg    ----------    Aortic valve                             Value          Reference  Aortic valve peak velocity, S            415   cm/s     ----------  Aortic valve mean velocity, S            290   cm/s     ----------  Aortic valve VTI, S                      117   cm       ----------  Aortic mean gradient, S                  39    mm Hg    ----------  Aortic peak gradient, S                  69    mm Hg    ----------  VTI ratio, LVOT/AV                       0.19           ----------  Aortic valve area, VTI                   0.6   cm^2     ----------  Aortic valve area/bsa, VTI               0.23  cm^2/m^2  ----------  Velocity ratio, peak, LVOT/AV            0.19           ----------  Aortic valve area, peak velocity         0.59  cm^2     ----------  Aortic valve area/bsa, peak              0.22  cm^2/m^2 ----------  velocity  Velocity ratio, mean, LVOT/AV            0.19           ----------  Aortic valve area, mean velocity         0.59  cm^2     ----------  Aortic valve area/bsa, mean              0.22  cm^2/m^2 ----------  velocity    Aorta                                    Value          Reference  Aortic root ID, ED                       36    mm       ----------    Left atrium                              Value          Reference  LA ID, A-P, ES                           62    mm       ----------  LA ID/bsa, A-P                   (H)     2.35  cm/m^2   <=2.2  LA volume, S                             173   ml       ----------  LA volume/bsa, S                         65.4  ml/m^2   ----------  LA volume, ES, 1-p A4C                   158   ml       ----------  LA volume/bsa, ES, 1-p A4C               59.8  ml/m^2   ----------  LA volume, ES, 1-p A2C                   171   ml       ----------  LA volume/bsa, ES, 1-p A2C  64.7  ml/m^2   ----------    Mitral valve                             Value          Reference  Mitral E-wave peak velocity              112   cm/s     ----------  Mitral A-wave peak velocity              49    cm/s     ----------  Mitral deceleration time         (H)     261   ms       150 - 230  Mitral peak gradient, D                  5     mm Hg    ----------  Mitral E/A ratio, peak                   2.3            ----------    Pulmonary arteries                       Value          Reference  PA pressure, S, DP               (H)     40    mm Hg    <=30    Tricuspid valve                          Value          Reference  Tricuspid regurg peak velocity           250   cm/s     ----------  Tricuspid peak RV-RA gradient            25    mm Hg     ----------    Right atrium                             Value          Reference  RA ID, S-I, ES, A4C              (H)     63.9  mm       34 - 49  RA area, ES, A4C                 (H)     34.7  cm^2     8.3 - 19.5  RA volume, ES, A/L                       152   ml       ----------  RA volume/bsa, ES, A/L                   57.5  ml/m^2   ----------    Systemic veins                           Value          Reference  Estimated CVP  3     mm Hg    ----------    Right ventricle                          Value          Reference  TAPSE                                    18.9  mm       ----------  RV pressure, S, DP                       28    mm Hg    <=30  RV s&', lateral, S                        7.34  cm/s     ----------  Legend: (L)  and  (H)  mark values outside specified reference range.  ------------------------------------------------------------------- Prepared and Electronically Authenticated by  Prentice Docker, MD 2018-04-10T12:48:10   Right/Left Heart Cath and Coronary Angiography  Conclusion     There is severe aortic valve stenosis.   1. Severe aortic stenosis with a mean transvalvular gradient of 43 mmHg and calculated AVA 0.92 square cm 2. 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 S/P CABG with continued patency of the SVG-diagonal and LIMA-LAD 4. Elevated intracardiac pressures consistent with chronic combined systolic and diastolic heart failure  Case discussed with Dr Diona Browner. Will move forward with further evaluation for aortic valve replacement/TAVR for treatment of severe symptomatic aortic stenosis   Indications   Severe aortic stenosis [I35.0 (ICD-10-CM)]  Procedural Details/Technique   Technical Details INDICATION: CHF, aortic stenosis, CAD s/p CABG, progressive dyspnea and fatigue, severe AS by echo (mean gradient 43 mmHg) with reduced LV function LVEF 45-50% diffuse  hypokinesis  PROCEDURAL DETAILS: The right heart cath was performed from the groin. Using the modified seldinger technique, the RFV was accessed with a front wall puncture and a 7 Fr sheath is placed. A swan-ganz catheter is used to record right heart pressures and draw oxygen saturations. The left wrist was then prepped, draped, and anesthetized with 1% lidocaine. Using the modified Seldinger technique a 5/6 French Slender sheath was placed in the left radial artery. Intra-arterial verapamil was administered through the radial artery sheath. IV heparin was administered after a JR4 catheter was advanced into the central aorta. Standard Judkins catheters were used for selective coronary angiography and bypass graft angiography. The aortic valve is crossed with a JR4 catheter and a straight wire. LV pressure is recorded and a pullback across the aortic valve is performed. There were no immediate procedural complications. The patient was transferred to the post catheterization recovery area for further monitoring.     Estimated blood loss <50 mL.  During this procedure the patient was administered the following to achieve and maintain moderate conscious sedation: Versed 1 mg, Fentanyl 25 mcg, while the patient's heart rate, blood pressure, and oxygen saturation were continuously monitored. The period of conscious sedation was 67 minutes, of which I was present face-to-face 100% of this time.    Coronary Findings   Dominance: Left  Left Anterior Descending  Prox LAD lesion, 100% stenosed.  Left Circumflex  There is mild the vessel.  First Obtuse Marginal Branch  There is mild disease  in the vessel.  Second Obtuse Marginal Branch  There is mild disease in the vessel.  Right Coronary Artery  Mid RCA lesion, 40% stenosed.  Graft Angiography  saphenous Graft to 1st Diag  SVG graft was visualized by angiography and is normal in caliber. The graft exhibits mild . Patent SVG-diagonal with mild  irregularity noted  Free LIMA Graft to Dist LAD  LIMA graft was visualized by angiography and is large and anatomically normal. widely patent LIMA-LAD graft  Right Heart   Right Heart Pressures Elevated LV EDP consistent with volume overload.    Left Heart   Aortic Valve There is severe aortic valve stenosis. The aortic valve is calcified. There is restricted aortic valve motion.    Coronary Diagrams   Diagnostic Diagram       Implants     No implant documentation for this case.  PACS Images   Show images for Cardiac catheterization   Link to Procedure Log   Procedure Log    Hemo Data    Most Recent Value  Fick Cardiac Output 6.55 L/min  Fick Cardiac Output Index 2.57 (L/min)/BSA  Aortic Mean Gradient 42.6 mmHg  Aortic Peak Gradient 45 mmHg  Aortic Valve Area 0.92  Aortic Value Area Index 0.36 cm2/BSA  RA A Wave 14 mmHg  RA V Wave 16 mmHg  RA Mean 15 mmHg  RV Systolic Pressure 54 mmHg  RV Diastolic Pressure 9 mmHg  RV EDP 15 mmHg  PA Systolic Pressure 59 mmHg  PA Diastolic Pressure 23 mmHg  PA Mean 40 mmHg  PW A Wave 34 mmHg  PW V Wave 35 mmHg  PW Mean 30 mmHg  AO Systolic Pressure 124 mmHg  AO Diastolic Pressure 55 mmHg  AO Mean 81 mmHg  LV Systolic Pressure 179 mmHg  LV Diastolic Pressure 14 mmHg  LV EDP 25 mmHg  Arterial Occlusion Pressure Extended Systolic Pressure 133 mmHg  Arterial Occlusion Pressure Extended Diastolic Pressure 54 mmHg  Arterial Occlusion Pressure Extended Mean Pressure 81 mmHg  Left Ventricular Apex Extended Systolic Pressure 178 mmHg  Left Ventricular Apex Extended Diastolic Pressure 15 mmHg  Left Ventricular Apex Extended EDP Pressure 22 mmHg  QP/QS 1  TPVR Index 13.24 HRUI  TSVR Index 31.54 HRUI  PVR SVR Ratio 0.06  TPVR/TSVR Ratio 0.42     Cardiac TAVR CT  TECHNIQUE: The patient was scanned on a Philips 256 scanner. A 120 kV retrospective scan was triggered in the descending thoracic aorta at 111 HU's. Gantry  rotation speed was 270 msecs and collimation was .9 mm. 5 mg of iv Metoprolol and no nitro were given. The 3D data set was reconstructed in 5% intervals of the R-R cycle. Systolic and diastolic phases were analyzed on a dedicated work station using MPR, MIP and VRT modes. The patient received 80 cc of contrast.  FINDINGS: Aortic Valve: Trileaflet, severely thickened and calcified with severely restricted leaflet opening, minimal calcifications extending into the LVOT.  Aorta: Normal size, no dissection. Mild calcifications in the ascending and descending aorta, severe circumferential calcifications in the aortic arch.  Sinotubular Junction:  31 x 31 mm  Ascending Thoracic Aorta:  33 x 30 mm  Aortic Arch:  31 x 27 mm  Descending Thoracic Aorta:  28 x 25 mm  Sinus of Valsalva Measurements:  Non-coronary:  32 mm  Right -coronary:  33 mm  Left -coronary:  35 mm  Coronary Artery Height above Annulus:  Left Main:  16 mm  Right Coronary:  16 mm  Virtual Basal Annulus Measurements:  Maximum/Minimum Diameter:  31 x 23 mm  Perimeter:  101 mm  Area:  551 mm2  Optimum Fluoroscopic Angle for Delivery:  LAO 2 CAU 3  IMPRESSION: The study quality is affected by motion artifact.  1. Trileaflet, severely thickened and calcified aortic valve with severely restricted leaflet opening and minimal calcifications extending into the LVOT. Annular measurements suitable for delivery of a 29 mm Edwards-SAPIEN 3 valve.  2.  Sufficient annulus to coronary distance.  3.  Optimum Fluoroscopic Angle for Delivery:  LAO 2 CAU 3  4.  No thrombus in the left atrial appendage.  Tobias Alexander   Electronically Signed   By: Tobias Alexander   On: 04/07/2017 19:50   CT ANGIOGRAPHY CHEST, ABDOMEN AND PELVIS  TECHNIQUE: Multidetector CT imaging through the chest, abdomen and pelvis was performed using the standard protocol during bolus administration  of intravenous contrast. Multiplanar reconstructed images and MIPs were obtained and reviewed to evaluate the vascular anatomy.  CONTRAST:  80 mL of Isovue 370.  COMPARISON:  None.  FINDINGS: CTA CHEST FINDINGS  Cardiovascular: Heart size is mildly enlarged with left atrial dilatation. Filling defect in the tip of the left atrial appendage (axial image 97 of series 5), highly concerning for left atrial appendage thrombus. There is no significant pericardial fluid, thickening or pericardial calcification. There is aortic atherosclerosis, as well as atherosclerosis of the great vessels of the mediastinum and the coronary arteries, including calcified atherosclerotic plaque in the left main, left anterior descending, left circumflex and right coronary arteries. Patient is status post median sternotomy for CABG including LIMA to the LAD.  Mediastinum/Lymph Nodes: Mildly enlarged low right paratracheal lymph node measuring 12 mm in short axis. No other mediastinal are hilar lymphadenopathy. Esophagus is unremarkable in appearance. No axillary lymphadenopathy.  Lungs/Pleura: No suspicious appearing pulmonary nodules or masses. No acute consolidative airspace disease. No pleural effusions.  Musculoskeletal/Soft Tissues: Old healed fracture of the left clavicle. There are no aggressive appearing lytic or blastic lesions noted in the visualized portions of the skeleton.  CTA ABDOMEN AND PELVIS FINDINGS  Hepatobiliary: No definite cystic or solid hepatic lesions. No intra or extrahepatic biliary ductal dilatation. Status post cholecystectomy.  Pancreas: Well-defined 12 mm low-attenuation lesion in the body of the pancreas (axial image 183 of series 5). No other suspicious appearing pancreatic mass. No pancreatic ductal dilatation. No pancreatic or peripancreatic fluid or inflammatory changes.  Spleen: The wedge-shaped hypovascular area in the lateral aspect of the  spleen could reflect a recent splenic infarct, best appreciated on image 164 of series 5 and coronal image 96 of series 8).  Adrenals/Urinary Tract: 2 wedge-shaped areas of hypoenhancement are noted in the lower pole of the left kidney, concerning for potential recent infarcts. Subcentimeter low-attenuation lesion in the upper pole the right kidney is too small to definitively characterize. 1.9 cm parapelvic cyst in the interpolar region of the right kidney. No hydroureteronephrosis. Urinary bladder is normal in appearance.  Stomach/Bowel: Normal appearance of the stomach. No pathologic dilatation of small bowel or colon. The appendix is not confidently identified and may be surgically absent. Regardless, there are no inflammatory changes noted adjacent to the cecum to suggest the presence of an acute appendicitis at this time.  Vascular/Lymphatic: Aortic atherosclerosis, without evidence of aneurysm or dissection in the abdominal or pelvic vasculature. Vascular findings and measurements pertinent to potential TAVR procedure, as detailed below. Celiac axis, superior mesenteric artery and inferior mesenteric artery are  all widely patent without hemodynamically significant stenosis. Single left and two right renal arteries are widely patent. No lymphadenopathy noted in the abdomen or pelvis.  Reproductive: Prostate gland and seminal vesicles are unremarkable in appearance.  Other: No significant volume of ascites.  No pneumoperitoneum.  Musculoskeletal: There are no aggressive appearing lytic or blastic lesions noted in the visualized portions of the skeleton.  VASCULAR MEASUREMENTS PERTINENT TO TAVR:  AORTA:  Minimal Aortic Diameter -  13 x 16 mm  Severity of Aortic Calcification -  severe  RIGHT PELVIS:  Right Common Iliac Artery -  Minimal Diameter - 8.9 x 6.1 mm  Tortuosity - mild  Calcification - moderate  Right External Iliac Artery -  Minimal  Diameter - 8.6 x 7.8 mm  Tortuosity - mild  Calcification - mild  Right Common Femoral Artery -  Minimal Diameter - 8.1 x 6.3 mm  Tortuosity - mild  Calcification - mild  LEFT PELVIS:  Left Common Iliac Artery -  Minimal Diameter - 9.5 x 8.9 mm  Tortuosity - mild  Calcification - moderate  Left External Iliac Artery -  Minimal Diameter - 7.9 x 6.3 mm  Tortuosity - mild  Calcification - mild  Left Common Femoral Artery -  Minimal Diameter - 8.6 x 5.2 mm  Tortuosity - mild  Calcification - mild  Review of the MIP images confirms the above findings.  IMPRESSION: 1. Vascular findings and measurements pertinent to potential TAVR procedure, as detailed above. This patient does appear to have suitable pelvic arterial access bilaterally. 2. Cardiomegaly with left atrial dilatation. Notably, there is a filling defect in the tip of the left atrial appendage highly concerning for left atrial appendage thrombus. If present, this places the patient at risk for potential systemic embolization. Correlation with transesophageal echocardiography is strongly recommended if clinically appropriate. 3. Importantly, there are peripheral wedge-shaped hypovascular areas in both the spleen and the lower pole of the left kidney, which could reflect recent embolic infarcts. 4. Severe thickening calcification of the aortic valve, compatible with the reported clinical history of aortic stenosis. 5. Aortic atherosclerosis, in addition to left main and 3 vessel coronary artery disease. Status post median sternotomy for CABG including LIMA to the LAD. 6. Mildly enlarged low right paratracheal lymph node. This is nonspecific, and could be chronic and reactive in this patient with prior history of sternotomy. No definite other findings to suggest malignancy noted elsewhere in the chest, abdomen or pelvis. 7. 12 mm low-attenuation lesion in the body of the pancreas.  This is nonspecific. Follow-up pancreatic protocol CT or MRI with contrast is recommended in 2 years to ensure the stability of this lesion. This recommendation follows ACR consensus guidelines: Management of Incidental Pancreatic Cysts: A White Paper of the ACR Incidental Findings Committee. J Am Coll Radiol 2017;14:911-923. 8. Additional incidental findings, as above.   Electronically Signed   By: Trudie Reed M.D.   On: 04/06/2017 12:20    STS Risk Calculator  Procedure    Redo CABG + AVR  Risk of Mortality   3.7% Morbidity or Mortality  21.9% Prolonged LOS   10.2% Short LOS    30.8% Permanent Stroke   1.7% Prolonged Vent Support  15.4% DSW Infection    0.5% Renal Failure    4.9% Reoperation    9.9%    Impression:  Patient has stage D severe symptomatic aortic stenosis. He presents with progressive symptoms of exertional shortness of breath, chest tightness, and fatigue consistent with chronic  diastolic congestive heart failure, New York Heart Association functional class IIIB.  He has also been having some dizzy spells without syncope. I have personally reviewed the patient's recent transthoracic echocardiogram, diagnostic cardiac catheterization, and CT angiograms. Echocardiogram confirms the presence of severe aortic stenosis. The aortic valve appears trileaflet with severe thickening, fibrosis, calcification, and restricted leaflet mobility involving all 3 leaflets. Peak velocity across the aortic valve measured greater than 4 m/s corresponding to mean transvalvular gradient estimated close to 40 mmHg. Diagnostic cardiac catheterization confirmed the presence of severe aortic stenosis with mean transvalvular gradient measured 43 mmHg. The patient has severe multivessel coronary artery disease but continued patency of the bypass grafts placed in 2004 at the time of his previous surgery. He does have significant disease in a right coronary artery that has not been  revascularized, but this vessel appears relatively small and nondominant.  I agree the patient would benefit from aortic valve replacement. Risks associated with conventional surgery would be at least moderately elevated because of the patient's age, numerous comorbid medical problems, and previous coronary artery bypass surgery.  He also has somewhat limited mobility and I suspect that recovery following conventional surgery would be quite slow.  Cardiac gated CT angiogram of the heart reveals anatomical findings consistent with severe aortic stenosis suitable for transcatheter aortic valve replacement without any significant complicating features. CT angiogram of the aorta and iliac vessels demonstrates moderate atherosclerotic disease particular in the femoral vessels but what appears to be adequate pelvic vascular access for a transfemoral approach.  Question was raised at the time of CT angiogram of the chest regarding the possibility of a clot in the distal portion of the left atrial appendage. However, this was not seen on the cardiac gated CT and gram of the heart and the patient was continuously anticoagulated and therapeutic on warfarin at the time.  Finally, the patient did have a very small benign-appearing low-attenuation lesion in the pancreas noted on CT scan. This requires follow-up imaging in 2 years.   Plan:  The patient and his wife and close friend were counseled at length regarding treatment alternatives for management of severe symptomatic aortic stenosis. Alternative approaches such as conventional aortic valve replacement, transcatheter aortic valve replacement, and palliative medical therapy were compared and contrasted at length.  The risks associated with conventional surgical aortic valve replacement were been discussed in detail, as were expectations for post-operative convalescence, and why I would be reluctant to consider this patient a candidate for conventional surgery.  Issues  specific to transcatheter aortic valve replacement were discussed including questions about long term valve durability, the potential for paravalvular leak, possible increased risk of need for permanent pacemaker placement, and other technical complications related to the procedure itself.  Long-term prognosis with medical therapy was discussed. This discussion was placed in the context of the patient's own specific clinical presentation and past medical history.  All of their questions been addressed.  The patient is interested in proceeding with transcatheter aortic valve replacement in the near future as an alternative to conventional surgery.  We tentatively plan to proceed with transcatheter aortic valve replacement via transfemoral approach on 05/09/2017  Following the decision to proceed with transcatheter aortic valve replacement, a discussion has been held regarding what types of management strategies would be attempted intraoperatively in the event of life-threatening complications, including whether or not the patient would be considered a candidate for the use of cardiopulmonary bypass and/or conversion to open sternotomy for attempted surgical intervention.  The patient has been advised of a variety of complications that might develop including but not limited to risks of death, stroke, paravalvular leak, aortic dissection or other major vascular complications, aortic annulus rupture, device embolization, cardiac rupture or perforation, mitral regurgitation, acute myocardial infarction, arrhythmia, heart block or bradycardia requiring permanent pacemaker placement, congestive heart failure, respiratory failure, renal failure, pneumonia, infection, other late complications related to structural valve deterioration or migration, or other complications that might ultimately cause a temporary or permanent loss of functional independence or other long term morbidity.  The patient provides full informed  consent for the procedure as described and all questions were answered.   I spent in excess of 90 minutes during the conduct of this office consultation and >50% of this time involved direct face-to-face encounter with the patient for counseling and/or coordination of their care.    Salvatore Decent. Cornelius Moras, MD 04/12/2017 2:58 PM

## 2017-04-13 ENCOUNTER — Other Ambulatory Visit: Payer: Self-pay | Admitting: *Deleted

## 2017-04-13 DIAGNOSIS — I350 Nonrheumatic aortic (valve) stenosis: Secondary | ICD-10-CM

## 2017-04-14 ENCOUNTER — Encounter: Payer: Medicare Other | Admitting: Cardiothoracic Surgery

## 2017-04-20 ENCOUNTER — Ambulatory Visit (INDEPENDENT_AMBULATORY_CARE_PROVIDER_SITE_OTHER): Payer: Medicare Other | Admitting: *Deleted

## 2017-04-20 DIAGNOSIS — I25119 Atherosclerotic heart disease of native coronary artery with unspecified angina pectoris: Secondary | ICD-10-CM | POA: Diagnosis not present

## 2017-04-20 DIAGNOSIS — Z5181 Encounter for therapeutic drug level monitoring: Secondary | ICD-10-CM

## 2017-04-20 DIAGNOSIS — I4891 Unspecified atrial fibrillation: Secondary | ICD-10-CM | POA: Diagnosis not present

## 2017-04-20 DIAGNOSIS — Z7901 Long term (current) use of anticoagulants: Secondary | ICD-10-CM | POA: Diagnosis not present

## 2017-04-20 DIAGNOSIS — I481 Persistent atrial fibrillation: Secondary | ICD-10-CM

## 2017-04-20 DIAGNOSIS — I4811 Longstanding persistent atrial fibrillation: Secondary | ICD-10-CM

## 2017-04-20 LAB — POCT INR: INR: 2.5

## 2017-04-20 MED ORDER — ENOXAPARIN SODIUM 120 MG/0.8ML ~~LOC~~ SOLN: 120.0000 mg | Freq: Two times a day (BID) | SUBCUTANEOUS | 1 refills | Status: DC

## 2017-04-20 NOTE — Patient Instructions (Signed)
Scheduled for TAVR 05/09/17  Labs 03/15/17: SCr 0.79 Hgb 13.6  HCT 41.1 CrCl  141.35 ml/min Wt.  120kg  6/20  Last dose of coumadin 6/21  No lovenox or coumadin 6/22  Lovenox 120mg  sq at 8am & 8pm 6/23  Lovenox 120mg  sq at 8am & 8pm 6/24  Lovenox 120mg  sq at 8am & 8pm 6/25  Lovenox 120mg  sq at 8am &  No lovenox in pm 6/26  No lovenox in am-------procedure------hospital admission

## 2017-04-23 ENCOUNTER — Encounter: Payer: Self-pay | Admitting: Cardiology

## 2017-04-23 NOTE — Progress Notes (Signed)
Cardiology Office Note  Date: 04/24/2017   ID: Clifford King, DOB 1941/03/05, MRN [id number]  PCP: Chana Bode, DO  Primary Cardiologist: Nona Dell, MD   Chief Complaint  Patient presents with  . Aortic Stenosis    History of Present Illness: Clifford King is a 76 y.o. male last seen in May and referred for cardiac catheterization with Dr. Excell Seltzer. The procedure revealed patient LIMA to LAD and SVG to diagonal, also confirmed severe aortic stenosis. He has since been assessed for TAVR with plan to move ahead in late June. He is here today with his wife for a follow-up visit. States that he is prepared to move ahead, overall no major change in symptoms.  There was question of possible LA appendage thrombus by initial CT imaging, however this was not confirmed by cardiac CT.  He continues on Coumadin with follow-up in the anticoagulation clinic. INR 2.5 on June 7. Plan is to transitioned to Lovenox prior to his TAVR.  Additional cardiac regimen currently includes aspirin, Norvasc, Lasix with potassium supplements, lisinopril, Toprol-XL, and Zocor.  Past Medical History:  Diagnosis Date  . Atrial fibrillation (HCC)   . Carotid artery disease (HCC)    Nonobstructive  . Coronary atherosclerosis of native coronary artery    Multivessel, LVEF 60%, occluded small nondominant RCA  (not bypassed)  . Essential hypertension, benign   . LBBB (left bundle branch block)   . Mixed hyperlipidemia   . Morbid obesity (HCC)   . 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 Severe aortic stenosis     Past Surgical History:  Procedure Laterality Date  . CORONARY ARTERY BYPASS GRAFT  02/17/2003   Off-pump LIMA to LAD, SVG to diagonal - Dr Tyrone Sage  . RIGHT/LEFT HEART CATH AND CORONARY ANGIOGRAPHY N/A 03/29/2017   Procedure: Right/Left Heart Cath and Coronary Angiography;  Surgeon: Tonny Bollman, MD;  Location: [facility] INVASIVE CV LAB;  Service: Cardiovascular;  Laterality: N/A;    Current Outpatient Prescriptions  Medication Sig Dispense Refill  . allopurinol (ZYLOPRIM) 100 MG tablet Take 100 mg by mouth daily.      Marland Kitchen amLODipine (NORVASC) 10 MG tablet TAKE 1 TABLET BY MOUTH DAILY 90 tablet 3  . aspirin 81 MG tablet Take 81 mg by mouth daily.      [name] ON 05/05/2017] enoxaparin (LOVENOX) 120 MG/0.8ML injection Inject 0.8 mLs (120 mg total) into the skin every 12 (twelve) hours. 10 Syringe 1  . furosemide (LASIX) 40 MG tablet Take 1 tablet (40 mg total) by mouth daily. 30 tablet 1  . lisinopril (PRINIVIL,ZESTRIL) 20 MG tablet Take 20 mg by mouth daily.      . metoprolol succinate (TOPROL-XL) 50 MG 24 hr tablet Take 1 & 1/2 tablets daily 135 tablet 1  . Nystatin (NYAMYC) 100000 UNIT/GM POWD Apply topically daily as needed (infections).     . potassium chloride (K-DUR) 10 MEQ tablet Take 1 tablet (10 mEq total) by mouth daily. 30 tablet 1  . simvastatin (ZOCOR) 10 MG tablet TAKE 1 TABLET BY MOUTH AT BEDTIME 90 tablet 3  . warfarin (COUMADIN) 5 MG tablet TAKE AS DIRECTED BY COUMADIN CLINIC (Patient taking differently: Take 5-10 mg by mouth See admin instructions. Takes 5 mg (1 tab) on Monday and Friday, all other  days (sun, tues, wed, thurs and Saturday) takes 2 (5mg ) tablets =10mg ) 180 tablet 0   No current facility-administered medications for this visit.    Allergies:  Vicodin [hydrocodone-acetaminophen]   Social History: The patient  reports that he quit smoking about 30 years ago. His smoking use included Cigars. He started smoking about 40 years ago. He has a 5.00 pack-year smoking history. He has never used smokeless tobacco. He reports that he does not drink alcohol or use drugs.   ROS:  Please see the history of present illness. Otherwise, complete review of systems is positive for dyspnea on  exertion, intermittent dizziness.  All other systems are reviewed and negative.   Physical Exam: VS:  BP 120/76   Pulse 64   Ht 6\' 5"  (1.956 m)   Wt 264 lb 6.4 oz (119.9 kg)   BMI 31.35 kg/m , BMI Body mass index is 31.35 kg/m.  Wt Readings from Last 3 Encounters:  04/24/17 264 lb 6.4 oz (119.9 kg)  04/12/17 264 lb 3.2 oz (119.8 kg)  03/29/17 273 lb (123.8 kg)    General: Obese male, appearscomfortable at rest. HEENT: Conjunctiva and lids normal, oropharynx clear with moist mucosa. Neck: Supple, no elevated JVP or carotid bruits, no thyromegaly. Lungs: Clear to auscultation, nonlabored breathing at rest. Cardiac: Irregularly irregular, no S3, 3/6 systolic murmur, no pericardial rub. Abdomen: Soft, nontender, bowel sounds present, no guarding or rebound. Extremities: No pitting edema, distal pulses 2+. Skin: Warm and dry. Musculoskeletal: No kyphosis. Neuropsychiatric: Alert and oriented 3, affect appropriate.  ECG: I personally reviewed the tracing from 03/29/2017 which showed atrial fibrillation with LBBB.  Recent Labwork:  May 2018: BUN 12, creatinine 0.79, potassium 3.9, Hgb 13.6, platelets105  Other Studies Reviewed Today:  Echocardiogram 02/21/2017: Study Conclusions  - Left ventricle: The cavity size was normal. There was mild   concentric hypertrophy. Systolic function was low normal to   mildly reduced. The estimated ejection fraction was in the range   of 45% to 50%. Diffuse hypokinesis. The study was not technically   sufficient to allow evaluation of LV diastolic dysfunction due to   atrial fibrillation. - Aortic valve: Peak velocity (S): 415 cm/s. Mean gradient (S): 41   mm Hg. Valve area (VTI): 0.6 cm^2. Valve area (Vmax): 0.59 cm^2.   Valve area (Vmean): 0.59 cm^2. - Mitral valve: Mildly calcified annulus. There was mild   regurgitation. - Left atrium: The atrium was severely dilated. - Right ventricle: Systolic function was mildly reduced. - Right  atrium: The atrium was mildly dilated. - Pulmonary arteries: PA peak pressure: 40 mm Hg (S).  Cardiac catheterization 03/29/2017: 1. Severe aortic stenosis with a mean transvalvular gradient of 43 mmHg and calculated AVA 0.92 square cm 2. 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 S/P CABG with continued patency of the SVG-diagonal and LIMA-LAD 4. Elevated intracardiac pressures consistent with chronic combined systolic and diastolic heart failure  Case discussed with Dr Diona Browner. Will move forward with further evaluation for aortic valve replacement/TAVR for treatment of severe symptomatic aortic stenosis.  Cardiac CT 04/06/2017: IMPRESSION: The study quality is affected by motion artifact.  1. Trileaflet, severely thickened and calcified aortic valve with severely restricted leaflet opening and minimal calcifications extending into the LVOT. Annular measurements suitable for delivery of a 29 mm Edwards-SAPIEN 3 valve.  2.  Sufficient annulus to coronary distance.  3.  Optimum Fluoroscopic Angle for Delivery:  LAO 2 CAU 3  4.  No  thrombus in the left atrial appendage.  Assessment and Plan:  1. Severe symptomatic aortic stenosis, has completed workup for TAVR as detailed above with plans to proceed on June 26.  2. Chronic atrial fibrillation, continuing with strategy of heart rate control and anticoagulation. He follows in the anticoagulation clinic, will transition off Coumadin to Lovenox prior to TAVR. He did not have evidence of left atrial appendage thrombus by gated cardiac CT.  3. CAD status post off pump CABG in 2004. LIMA to LAD and SVG to diagonal were patent at recent angiography. He has a small nondominant RCA that is occluded.  4. LVEF approximately 45%, no volume overload at this time. Continue with current medical therapy.  5. Incidentally noted nonspecific low attenuation lesion in the body of the pancreas by CT imaging in May.  Follow-up study recommended in 2 years.  Current medicines were reviewed with the patient today.  Disposition: Follow-up in 6 weeks.  Signed, Jonelle SidleSamuel G. Ronette Hank, MD, [facility] Center[facility]ACC 04/24/2017 11:47 AM    [facility] [address], [place], [place] [address] Phone: [phone]; Fax: [phone]

## 2017-04-24 ENCOUNTER — Ambulatory Visit: Payer: Medicare Other | Admitting: Cardiology

## 2017-04-24 ENCOUNTER — Ambulatory Visit (INDEPENDENT_AMBULATORY_CARE_PROVIDER_SITE_OTHER): Payer: Medicare Other | Admitting: Cardiology

## 2017-04-24 ENCOUNTER — Encounter: Payer: Self-pay | Admitting: Cardiology

## 2017-04-24 VITALS — BP 120/76 | HR 64 | Ht 77.0 in | Wt 264.4 lb

## 2017-04-24 DIAGNOSIS — I25119 Atherosclerotic heart disease of native coronary artery with unspecified angina pectoris: Secondary | ICD-10-CM | POA: Diagnosis not present

## 2017-04-24 DIAGNOSIS — I482 Chronic atrial fibrillation, unspecified: Secondary | ICD-10-CM

## 2017-04-24 DIAGNOSIS — I209 Angina pectoris, unspecified: Secondary | ICD-10-CM

## 2017-04-24 DIAGNOSIS — I429 Cardiomyopathy, unspecified: Secondary | ICD-10-CM | POA: Diagnosis not present

## 2017-04-24 DIAGNOSIS — I350 Nonrheumatic aortic (valve) stenosis: Secondary | ICD-10-CM

## 2017-04-24 NOTE — Patient Instructions (Signed)
Your physician recommends that you schedule a follow-up appointment in: 6 WEEKS WITH DR MCDOWELL  Your physician recommends that you continue on your current medications as directed. Please refer to the Current Medication list given to you today.  Thank you for choosing [facility]!!    

## 2017-04-27 ENCOUNTER — Ambulatory Visit (INDEPENDENT_AMBULATORY_CARE_PROVIDER_SITE_OTHER): Payer: Medicare Other | Admitting: *Deleted

## 2017-04-27 DIAGNOSIS — I481 Persistent atrial fibrillation: Secondary | ICD-10-CM | POA: Diagnosis not present

## 2017-04-27 DIAGNOSIS — Z5181 Encounter for therapeutic drug level monitoring: Secondary | ICD-10-CM

## 2017-04-27 DIAGNOSIS — I25119 Atherosclerotic heart disease of native coronary artery with unspecified angina pectoris: Secondary | ICD-10-CM

## 2017-04-27 DIAGNOSIS — Z7901 Long term (current) use of anticoagulants: Secondary | ICD-10-CM

## 2017-04-27 DIAGNOSIS — I4891 Unspecified atrial fibrillation: Secondary | ICD-10-CM | POA: Diagnosis not present

## 2017-04-27 DIAGNOSIS — I4811 Longstanding persistent atrial fibrillation: Secondary | ICD-10-CM

## 2017-04-27 LAB — POCT INR: INR: 2.1

## 2017-05-02 ENCOUNTER — Encounter: Payer: Self-pay | Admitting: Cardiothoracic Surgery

## 2017-05-02 ENCOUNTER — Encounter (HOSPITAL_COMMUNITY)
Admission: RE | Admit: 2017-05-02 | Discharge: 2017-05-02 | Disposition: A | Discharge status: Home / Self Care | Payer: Medicare Other | Source: Ambulatory Visit | Attending: Cardiovascular Disease | Admitting: Cardiovascular Disease

## 2017-05-02 ENCOUNTER — Institutional Professional Consult (permissible substitution) (INDEPENDENT_AMBULATORY_CARE_PROVIDER_SITE_OTHER): Payer: Medicare Other | Admitting: Cardiothoracic Surgery

## 2017-05-02 ENCOUNTER — Ambulatory Visit: Payer: Medicare Other | Attending: Cardiothoracic Surgery | Admitting: Physical Therapy

## 2017-05-02 ENCOUNTER — Encounter: Payer: Self-pay | Admitting: Physical Therapy

## 2017-05-02 ENCOUNTER — Encounter (HOSPITAL_COMMUNITY): Payer: Self-pay

## 2017-05-02 ENCOUNTER — Ambulatory Visit (HOSPITAL_COMMUNITY)
Admission: RE | Admit: 2017-05-02 | Discharge: 2017-05-02 | Disposition: A | Discharge status: Home / Self Care | Payer: Medicare Other | Source: Ambulatory Visit | Attending: Cardiovascular Disease | Admitting: Cardiovascular Disease

## 2017-05-02 VITALS — BP 118/63 | HR 68 | Resp 20 | Ht 77.0 in | Wt 263.0 lb

## 2017-05-02 DIAGNOSIS — R293 Abnormal posture: Secondary | ICD-10-CM | POA: Insufficient documentation

## 2017-05-02 DIAGNOSIS — M6281 Muscle weakness (generalized): Secondary | ICD-10-CM | POA: Diagnosis present

## 2017-05-02 DIAGNOSIS — R2689 Other abnormalities of gait and mobility: Secondary | ICD-10-CM | POA: Insufficient documentation

## 2017-05-02 DIAGNOSIS — Z951 Presence of aortocoronary bypass graft: Secondary | ICD-10-CM

## 2017-05-02 DIAGNOSIS — I25119 Atherosclerotic heart disease of native coronary artery with unspecified angina pectoris: Secondary | ICD-10-CM

## 2017-05-02 DIAGNOSIS — I350 Nonrheumatic aortic (valve) stenosis: Secondary | ICD-10-CM | POA: Insufficient documentation

## 2017-05-02 DIAGNOSIS — Z01818 Encounter for other preprocedural examination: Secondary | ICD-10-CM | POA: Diagnosis present

## 2017-05-02 HISTORY — DX: Unspecified osteoarthritis, unspecified site: M19.90

## 2017-05-02 LAB — URINALYSIS, ROUTINE W REFLEX MICROSCOPIC
Bilirubin Urine: NEGATIVE
GLUCOSE, UA: NEGATIVE mg/dL
Hgb urine dipstick: NEGATIVE
Ketones, ur: NEGATIVE mg/dL
LEUKOCYTES UA: NEGATIVE
NITRITE: NEGATIVE
PH: 7 (ref 5.0–8.0)
PROTEIN: NEGATIVE mg/dL
Specific Gravity, Urine: 1.014 (ref 1.005–1.030)

## 2017-05-02 LAB — COMPREHENSIVE METABOLIC PANEL
ALT: 28 U/L (ref 17–63)
AST: 29 U/L (ref 15–41)
Albumin: 4 g/dL (ref 3.5–5.0)
Alkaline Phosphatase: 62 U/L (ref 38–126)
Anion gap: 9 (ref 5–15)
BUN: 16 mg/dL (ref 6–20)
CHLORIDE: 107 mmol/L (ref 101–111)
CO2: 23 mmol/L (ref 22–32)
CREATININE: 0.88 mg/dL (ref 0.61–1.24)
Calcium: 8.9 mg/dL (ref 8.9–10.3)
GFR calc Af Amer: >60 mL/min (ref >60)
GFR calc non Af Amer: >60 mL/min (ref >60)
Glucose, Bld: 108 mg/dL — ABNORMAL HIGH (ref 65–99)
POTASSIUM: 4.1 mmol/L (ref 3.5–5.1)
SODIUM: 139 mmol/L (ref 135–145)
Total Bilirubin: 1.2 mg/dL (ref 0.3–1.2)
Total Protein: 6.4 g/dL — ABNORMAL LOW (ref 6.5–8.1)

## 2017-05-02 LAB — BLOOD GAS, ARTERIAL
Acid-Base Excess: 1.9 mmol/L (ref 0.0–2.0)
Bicarbonate: 25.2 mmol/L (ref 20.0–28.0)
DRAWN BY: 470591
FIO2: 21
O2 SAT: 93.4 %
PATIENT TEMPERATURE: 98.6
pCO2 arterial: 34.5 mmHg (ref 32.0–48.0)
pH, Arterial: 7.477 — ABNORMAL HIGH (ref 7.350–7.450)
pO2, Arterial: 67.9 mmHg — ABNORMAL LOW (ref 83.0–108.0)

## 2017-05-02 LAB — TYPE AND SCREEN
ABO/RH(D): A POS
Antibody Screen: NEGATIVE

## 2017-05-02 LAB — ABO/RH: ABO/RH(D): A POS

## 2017-05-02 LAB — APTT: APTT: 40 s — AB (ref 24–36)

## 2017-05-02 LAB — CBC
HCT: 41.5 % (ref 39.0–52.0)
Hemoglobin: 13.4 g/dL (ref 13.0–17.0)
MCH: 29.1 pg (ref 26.0–34.0)
MCHC: 32.3 g/dL (ref 30.0–36.0)
MCV: 90.2 fL (ref 78.0–100.0)
PLATELETS: 82 10*3/uL — AB (ref 150–400)
RBC: 4.6 MIL/uL (ref 4.22–5.81)
RDW: 14.2 % (ref 11.5–15.5)
WBC: 4.7 10*3/uL (ref 4.0–10.5)

## 2017-05-02 LAB — SURGICAL PCR SCREEN
MRSA, PCR: NEGATIVE
STAPHYLOCOCCUS AUREUS: NEGATIVE

## 2017-05-02 LAB — PROTIME-INR
INR: 2.23
Prothrombin Time: 25.1 seconds — ABNORMAL HIGH (ref 11.4–15.2)

## 2017-05-02 NOTE — Progress Notes (Signed)
Platelet ct. 82  INR 2.23   P.25.1 has not stopped coumadin yet.  Wednesday last day fir coumadin,on Thursday will start Lovenox on Thursday.  Chart forwarded to Jolyne LoaAngela or Allison for review,

## 2017-05-02 NOTE — Therapy (Signed)
[facility] [address] [place], [place], [address] Phone: [phone]   Fax:  [phone]  Physical Therapy Evaluation  Patient Details  Name: Clifford King MRN: [id number] Date of Birth: Mar 19, 1941 Referring Provider: Dr. Tonny Bollman  Encounter Date: 05/02/2017      PT End of Session - 05/02/17 1457    Visit Number 1   PT Start Time 1457   PT Stop Time 1545   PT Time Calculation (min) 48 min   Equipment Utilized During Treatment Gait belt      Past Medical History:  Diagnosis Date  . Arthritis   . Atrial fibrillation (HCC)   . Carotid artery disease (HCC)    Nonobstructive  . CHF (congestive heart failure) (HCC)   . Coronary atherosclerosis of native coronary artery    Multivessel, LVEF 60%, occluded small nondominant RCA  (not bypassed)  . Dyspnea    with exertion  . Essential hypertension, benign   . LBBB (left bundle branch block)   . Mixed hyperlipidemia   . Morbid obesity (HCC)   . 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 Severe aortic stenosis     Past Surgical History:  Procedure Laterality Date  . CORONARY ARTERY BYPASS GRAFT  02/17/2003   Off-pump LIMA to LAD, SVG to diagonal - Dr Tyrone Sage  . RIGHT/LEFT HEART CATH AND CORONARY ANGIOGRAPHY N/A 03/29/2017   Procedure: Right/Left Heart Cath and Coronary Angiography;  Surgeon: Tonny Bollman, MD;  Location: [facility] INVASIVE CV LAB;  Service: Cardiovascular;  Laterality: N/A;  . Rotater cuff Left   . VARICOSE VEIN SURGERY Right 1988    There were no vitals filed for this visit.       Subjective Assessment - 05/02/17 1459    Subjective Saw an MD at the urgent care after last visit for L foot. Reports heel spurs and plantarfascitis. Told him to ice and walk. Family MD gave him 2  cortisone shots about 2 weeks ago however reports no improvement in symptoms. Pt presenting with reports of 6-12 month progressive shortness of breath with mild activity and occasionally at reat. Pt also reports dizziness and some chest pressure with activity.    Patient Stated Goals to fix heart   Currently in Pain? No/denies            [facility] PT Assessment - 05/02/17 0001      Assessment   Medical Diagnosis severe aortic stenosis   Referring Provider Dr. Tonny Bollman   Onset Date/Surgical Date --  approximately 12 months ago     Precautions   Precautions Fall     Restrictions   Weight Bearing Restrictions No     Balance Screen   Has the patient fallen in the past 6 months No   Has the patient had a decrease in activity level because of a fear of falling?  No   Is the patient reluctant to leave their home because of a fear of falling?  No     Home [employer]ist information centre manager residence   Living Arrangements Spouse/significant other   Home Access Stairs to enter   Entrance Stairs-Number of Steps 2   Entrance Stairs-Rails None   Home Layout Multi-level   Alternate Level Stairs-Number of Steps 12  Alternate Level Stairs-Rails Right;Left     Posture/Postural Control   Posture/Postural Control Postural limitations   Postural Limitations Rounded Shoulders;Forward head  moderate     ROM / Strength   AROM / PROM / Strength AROM;Strength     AROM   Overall AROM Comments UE limited in shoulder flexion bil - pt reports RTC injuries, R IR limited > L IR, otheriwse WNL     Strength   Overall Strength Comments UE & LE grossly 4/5 except L ankle DF 4-/5   Strength Assessment Site Hand   Right/Left hand Right;Left   Right Hand Grip (lbs) 90  R hand dominant   Left Hand Grip (lbs) 70     Ambulation/Gait   Gait Comments With short distances, most notable gait deviation is forward flexed posture. With distances >250', pt becomes increasing unsteady requiring CGA  to min A and demonstrates a scissoring gait. Pt's gait distance is limited by 58% for his age and gender.           [facility] Pre-Surgical Assessment - 05/02/17 0001    5 Meter Walk Test- trial 1 5 sec   5 Meter Walk Test- trial 2 5 sec.    5 Meter Walk Test- trial 3 5 sec.   5 meter walk test average 5 sec   4 Stage Balance Test tolerated for:  10 sec.   4 Stage Balance Test Position 2   Sit To Stand Test- trial 1 18 sec.   ADL/IADL Independent with: Bathing;Dressing;Meal prep;Finances   ADL/IADL Needs Assistance with: [employer] work   ADL/IADL Fraility Index Vulnerable   6 Minute Walk- Baseline yes   BP (mmHg) 127/63   HR (bpm) 62   02 Sat (%RA) 96 %   Modified Borg Scale for Dyspnea 0- Nothing at all   Perceived Rate of Exertion (Borg) 6-   6 Minute Walk Post Test yes   BP (mmHg) 170/70   HR (bpm) 78   02 Sat (%RA) 99 %   Modified Borg Scale for Dyspnea 5- Strong or hard breathing   Perceived Rate of Exertion (Borg) 15- Hard   Aerobic Endurance Distance Walked 730          Objective measurements completed on examination: See above findings.                  PT Education - 05/02/17 1520    Education provided Yes   Education Details fall risk   Person(s) Educated Patient   Methods Explanation   Comprehension Verbalized understanding                     Plan - 05/02/17 1519    Clinical Impression Statement see below   PT Frequency One time visit   Consulted and Agree with Plan of Care Patient     Clinical Impression Statement: Pt is a 76 yo male presenting to OP PT for evaluation prior to possible TAVR surgery due to severe aortic stenosis. Pt reports onset of shortness of breath with mild activity as well as chest pressure and dizziness approximately 12 months ago. Symptoms are limiting ability to walk to the mailbox and perform other daily activities requiring ambulation of any distances >approximately 100-200'. Pt presents with fair to good ROM  and strength, poor balance and is at high fall risk 4 stage balance test, good walking speed and fair aerobic endurance per 6 minute walk test. Pt ambulated 360 feet in 2:30 before requesting a seated  rest beak lasting 1:15. Pt ambulated a total of 730 feet in 6 minute walk. BP increased significantly with 6 minute walk test. Based on the Short Physical Performance Battery, patient has a frailty rating of 6/12 with </= 5/12 considered frail.   Patient demonstrated the following deficits and impairments:     Visit Diagnosis: Other abnormalities of gait and mobility  Muscle weakness (generalized)  Abnormal posture      G-Codes - 05/30/17 1850    Functional Assessment Tool Used (Outpatient Only) 6 minute walk 730'   Functional Limitation Mobility: Walking and moving around   Mobility: Walking and Moving Around Current Status [phone]) At least 40 percent but less than 60 percent impaired, limited or restricted   Mobility: Walking and Moving Around Goal Status [phone]) At least 40 percent but less than 60 percent impaired, limited or restricted   Mobility: Walking and Moving Around Discharge Status [phone]) At least 40 percent but less than 60 percent impaired, limited or restricted       Problem List Patient Active Problem List   Diagnosis Date Noted  . LBBB (left bundle branch block)   . 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 Severe aortic stenosis   . Encounter for therapeutic drug monitoring 12/27/2013  . Long term (current) use of anticoagulants 02/21/2011  . Essential hypertension, benign 10/19/2009  . CAROTID ARTERY DISEASE 10/19/2009  . HYPERLIPIDEMIA-MIXED 08/06/2009  . CAD, NATIVE VESSEL 08/06/2009  . Longstanding persistent atrial fibrillation (HCC) 08/06/2009  . 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 [place], [place], [address] Phone: [phone]   Fax:  [phone]  Name:  ALEJOS REINHARDT MRN: [id number] Date of Birth: 1941-11-09

## 2017-05-02 NOTE — Pre-Procedure Instructions (Signed)
ABUBAKAR CRISPO  05/02/2017      [facility] [address] - [place], [place] - [address] AT [facility] [address] [place] [address] Phone: [phone] Fax: [phone]    Your procedure is scheduled on 05-09-2017 Tuesday   Report to [facility] Admitting at 5:30 A.M.   Call this number if you have problems the morning of surgery:  [phone]   Remember:  Do not eat food or drink liquids after midnight.   Take these medicines the morning of surgery with A SIP OF WATER allopurinol(Zyloprim),Lovenox,  STOP ASPIRIN,ANTIINFLAMATORIES (IBUPROFEN,ALEVE,MOTRIN,ADVIL,GOODY'S POWDERS),HERBAL SUPPLEMENTS,FISH OIL,AND VITAMINS 5-7 DAYS PRIOR TO SURGERY     Do not wear jewelry,  Do not wear lotions, powders, or  deoderant.  Do not shave 48 hours prior to surgery.  Men may shave face and neck.   Do not bring valuables to the hospital.   [facility] is not responsible for any belongings or valuables.  Contacts, dentures or bridgework may not be worn into surgery.  Leave your suitcase in the car.  After surgery it may be brought to your room.  For patients admitted to the hospital, discharge time will be determined by your treatment team.  Patients discharged the day of surgery will not be allowed to drive home.   Special Instructions: [place] - Preparing for Surgery  Before surgery, you can play an important role.  Because skin is not sterile, your skin needs to be as free of germs as possible.  You can reduce the number of germs on you skin by washing with CHG (chlorahexidine gluconate) soap before surgery.  CHG is an antiseptic cleaner which kills germs and bonds with the skin to continue killing germs even after washing.  Please DO NOT use if you have an allergy to CHG or antibacterial soaps.  If your skin becomes reddened/irritated stop using the CHG and inform your nurse when you arrive at Short  Stay.  Do not shave (including legs and underarms) for at least 48 hours prior to the first CHG shower.  You may shave your face.  Please follow these instructions carefully:   1.  Shower with CHG Soap the night before surgery and the   morning of Surgery.  2.  If you choose to wash your hair, wash your hair first as usual with your normal shampoo.  3.  After you shampoo, rinse your hair and body thoroughly to remove the  Shampoo.  4.  Use CHG as you would any other liquid soap.  You can apply chg directly  to the skin and wash gently with scrungie or a clean washcloth.  5.  Apply the CHG Soap to your body ONLY FROM THE NECK DOWN.   Do not use on open wounds or open sores.  Avoid contact with your eyes,  ears, mouth and genitals (private parts).  Wash genitals (private parts) with your normal soap.  6.  Wash thoroughly, paying special attention to the area where your surgery will be performed.  7.  Thoroughly rinse your body with warm water from the neck down.  8.  DO NOT shower/wash with your normal soap after using and rinsing o  the CHG Soap.  9.  Pat yourself dry with a clean towel.            10.  Wear clean pajamas.            11.  Place  clean sheets on your bed the night of your first shower and do not sleep with pets.  Day of Surgery  Do not apply any lotions/deodorants the morning of surgery.  Please wear clean clothes to the hospital/surgery center.     Please read over the following fact sheets that you were given. Coughing and Deep Breathing, MRSA Information and Surgical Site Infection Prevention,Incentive Spirometry

## 2017-05-02 NOTE — Progress Notes (Signed)
[address]       [place] [address]             [phone]                    Clifford King [facility] Medical Record #[id number] Date of Birth: [birth year]  Referring: Clifford Sidle, MD Primary Care: Clifford Bode, DO  Chief Complaint:    Chief Complaint  Patient presents with  . Aortic Stenosis    2nd TAVR eval, review all studies, surgery scheduled for 05/09/17    History of Present Illness:    Clifford King 76 y.o. male is seen in the office  today for second option for TVAR for aortic stenosis. Patient has noted over the past 6 months. Progressive dyspnea with exertion and spells of dizziness but no frank syncope. He notes is barely able to walk 25 feet without having to stop because of dyspnea. Original saw the patient in April 2004 when he presented with unstable anginal symptoms. He had multiple stents put and radiation therapy of the left anterior descending coronary artery. On [birth year]/75 year old underwent off-pump coronary artery bypass grafting with the left internal mammary to left anterior descending coronary artery and reverse saphenous vein graft to the diagonal coronary artery at the time he was noted to have a very small right coronary artery with stenosis but the distal vessel was less than 1 mm not bypassed. He is now noted on evaluation with echocardiogram have critical aortic stenosis which is now symptomatic     Current Activity/ Functional Status:  Patient is independent with mobility/ambulation, transfers, ADL's, IADL's.   Zubrod Score: At the time of surgery this patient's most appropriate activity status/level should be described as: []     0    Normal activity, no symptoms []     1    Restricted in physical strenuous activity but ambulatory, able to do out light work [x]     2    Ambulatory and capable of self care, unable to do work activities, up and about               >50 % of waking hours                               []     3    Only limited self care, in bed greater than 50% of waking hours []     4    Completely disabled, no self care, confined to bed or chair []     5    Moribund   Past Medical History:  Diagnosis Date  . Arthritis   . Atrial fibrillation (HCC)   . Carotid artery disease (HCC)    Nonobstructive  . CHF (congestive heart failure) (HCC)   . Coronary atherosclerosis of native coronary artery    Multivessel, LVEF 60%, occluded small nondominant RCA  (not bypassed)  . Dyspnea    with exertion  . Essential hypertension, benign   . LBBB (left bundle branch block)   . Mixed hyperlipidemia   . Morbid obesity (HCC)   . 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 Severe aortic stenosis     Past Surgical  History:  Procedure Laterality Date  . CORONARY ARTERY BYPASS GRAFT  02/17/2003   Off-pump LIMA to LAD, SVG to diagonal - Dr Tyrone Sage  . RIGHT/LEFT HEART CATH AND CORONARY ANGIOGRAPHY N/A 03/29/2017   Procedure: Right/Left Heart Cath and Coronary Angiography;  Surgeon: Tonny Bollman, MD;  Location: [facility] INVASIVE CV LAB;  Service: Cardiovascular;  Laterality: N/A;  . Rotater cuff Left   . VARICOSE VEIN SURGERY Right 1988    Family History  Problem Relation Age of Onset  . Stroke Other        family h/o  . Cancer Other        family h/o    Social History   Social History  . Marital status: Married    Spouse name: N/A  . Number of children: N/A  . Years of education: N/A   Occupational History  . Retired     Disabled  .  Retired   Social History Main Topics  . Smoking status: Former Smoker    Packs/day: 0.50    Years: 10.00    Types: Cigars    Start date: 11/14/1976    Quit date: 11/14/1986  . Smokeless tobacco: Never Used  . Alcohol use No  . Drug use: No  . Sexual activity: Not on file   Other  Topics Concern  . Not on file   Social History Narrative  . No narrative on file    History  Smoking Status  . Former Smoker  . Packs/day: 0.50  . Years: 10.00  . Types: Cigars  . Start date: 11/14/1976  . Quit date: 11/14/1986  Smokeless Tobacco  . Never Used    History  Alcohol Use No     Allergies  Allergen Reactions  . Vicodin [Hydrocodone-Acetaminophen] Other (See Comments)    hallucinations    Current Outpatient Prescriptions  Medication Sig Dispense Refill  . allopurinol (ZYLOPRIM) 100 MG tablet Take 100 mg by mouth daily.      Marland Kitchen amLODipine (NORVASC) 10 MG tablet TAKE 1 TABLET BY MOUTH DAILY (Patient taking differently: TAKE 1 TABLET BY MOUTH DAILY IN THE EVENING) 90 tablet 3  . aspirin 81 MG tablet Take 81 mg by mouth daily.      [name] ON 05/05/2017] enoxaparin (LOVENOX) 120 MG/0.8ML injection Inject 0.8 mLs (120 mg total) into the skin every 12 (twelve) hours. 10 Syringe 1  . furosemide (LASIX) 40 MG tablet Take 1 tablet (40 mg total) by mouth daily. (Patient not taking: Reported on 05/02/2017) 30 tablet 1  . lisinopril (PRINIVIL,ZESTRIL) 20 MG tablet Take 20 mg by mouth daily.      . metoprolol succinate (TOPROL-XL) 50 MG 24 hr tablet Take 1 & 1/2 tablets daily (Patient taking differently: Take 75 mg by mouth every evening. Take 1 & 1/2 tablets daily) 135 tablet 1  . Nystatin (NYAMYC) 100000 UNIT/GM POWD Apply topically daily as needed (infections).     . potassium chloride (K-DUR) 10 MEQ tablet Take 1 tablet (10 mEq total) by mouth daily. (Patient not taking: Reported on 05/02/2017) 30 tablet 1  . simvastatin (ZOCOR) 10 MG tablet TAKE 1 TABLET BY MOUTH AT BEDTIME 90 tablet 3  . warfarin (COUMADIN) 5 MG tablet TAKE AS DIRECTED BY COUMADIN CLINIC (Patient taking differently: Take 5-10 mg by mouth See admin instructions. Takes 5 mg (1 tab) on Monday and Friday, all other days (sun, tues, wed, thurs and Saturday) takes 2 (5mg ) tablets =10mg ) 180 tablet 0   No current  facility-administered  medications for this visit.       Review of Systems:     Cardiac Review of Systems: Y or N  Chest Pain [  n  ]  Resting SOB [ y  ] Exertional SOB  [ y ]  Orthopnea Cove.Etienne ]   Pedal Edema [  y ]    Palpitations Cove.Etienne  ] Syncope  [ n ]   Presyncope [ y  ]  General Review of Systems: [Y] = yes [  ]=no Constitional: recent weight change [  ];  Wt loss over the last 3 months [   ] anorexia [  ]; fatigue [  ]; nausea [  ]; night sweats [  ]; fever [  ]; or chills [  ];          Dental: poor dentition[  ]; Last Dentist visit:   Eye : blurred vision [  ]; diplopia [   ]; vision changes [  ];  Amaurosis fugax[  ]; Resp: cough [n ];  wheezing[ n ];  hemoptysisn[  ]; shortness of breath[ y ]; paroxysmal nocturnal dyspnea[ y ]; dyspnea on exertion[y  ]; or orthopnea[y  ];  GI:  gallstones[  ], vomiting[  ];  dysphagia[  ]; melena[  ];  hematochezia [  ]; heartburn[  ];   Hx of  Colonoscopy[  ]; GU: kidney stones [  ]; hematuria[  ];   dysuria [  ];  nocturia[  ];  history of     obstruction [  ]; urinary frequency [  ]             Skin: rash, swelling[  ];, hair loss[  ];  peripheral edema[  ];  or itching[  ]; Musculosketetal: myalgias[  ];  joint swelling[  ];  joint erythema[  ];  joint pain[  ];  back pain[  ];  Heme/Lymph: bruising[  ];  bleeding[  ];  anemia[  ];  Neuro: TIA[  ];  headaches[  ];  stroke[  ];  vertigo[  ];  seizures[  ];   paresthesias[  ];  difficulty walking[ y ];  Psych:depression[  ]; anxiety[  ];  Endocrine: diabetes[  ];  thyroid dysfunction[  ];  Immunizations: Flu up to date Cove.Etienne  ]; Pneumococcal up to date Cove.Etienne  ];  Other:  Physical Exam: BP 118/63   Pulse 68   Resp 20   Ht 6\' 5"  (1.956 m)   Wt 263 lb (119.3 kg)   SpO2 95% Comment: RA  BMI 31.19 kg/m   PHYSICAL EXAMINATION: General appearance: alert, cooperative, appears older than stated age and no distress Head: Normocephalic, without obvious abnormality, atraumatic Neck: no adenopathy, no  carotid bruit, no JVD, supple, symmetrical, trachea midline and thyroid not enlarged, symmetric, no tenderness/mass/nodules Lymph nodes: Cervical, supraclavicular, and axillary nodes normal. Resp: clear to auscultation bilaterally Back: symmetric, no curvature. ROM normal. No CVA tenderness. Cardio: irregularly irregular rhythm and systolic murmur: holosystolic 3/6, crescendo throughout the precordium GI: soft, non-tender; bowel sounds normal; no masses,  no organomegaly Extremities: varicose veins noted and Severe bilateral varicose veins right greater than left Neurologic: Grossly normal  Diagnostic Studies & Laboratory data:     Recent Radiology Findings:   Dg Chest 2 View  Result Date: 05/02/2017 CLINICAL DATA:  Preoperative evaluation EXAM: CHEST  2 VIEW COMPARISON:  04/06/2017 FINDINGS: Cardiac shadow remains enlarged. Postsurgical changes are again seen. The lungs are well aerated bilaterally without focal  infiltrate or sizable effusion. Mild degenerative changes of thoracic spine are noted. IMPRESSION: No acute abnormality seen. Electronically Signed   By: Alcide Clever M.D.   On: 05/02/2017 15:49   Ct Coronary Morph W/cta Cor W/score W/ca W/cm &/or Wo/cm  Addendum Date: 04/07/2017   ADDENDUM REPORT: 04/07/2017 19:50 CLINICAL DATA:  76 year old male with severe aortic stenosis evaluated for TAVR. EXAM: Cardiac TAVR CT TECHNIQUE: The patient was scanned on a Philips 256 scanner. A 120 kV retrospective scan was triggered in the descending thoracic aorta at 111 HU's. Gantry rotation speed was 270 msecs and collimation was .9 mm. 5 mg of iv Metoprolol and no nitro were given. The 3D data set was reconstructed in 5% intervals of the R-R cycle. Systolic and diastolic phases were analyzed on a dedicated work station using MPR, MIP and VRT modes. The patient received 80 cc of contrast. FINDINGS: Aortic Valve: Trileaflet, severely thickened and calcified with severely restricted leaflet opening,  minimal calcifications extending into the LVOT. Aorta: Normal size, no dissection. Mild calcifications in the ascending and descending aorta, severe circumferential calcifications in the aortic arch. Sinotubular Junction:  31 x 31 mm Ascending Thoracic Aorta:  33 x 30 mm Aortic Arch:  31 x 27 mm Descending Thoracic Aorta:  28 x 25 mm Sinus of Valsalva Measurements: Non-coronary:  32 mm Right -coronary:  33 mm Left -coronary:  35 mm Coronary Artery Height above Annulus: Left Main:  16 mm Right Coronary:  16 mm Virtual Basal Annulus Measurements: Maximum/Minimum Diameter:  31 x 23 mm Perimeter:  101 mm Area:  551 mm2 Optimum Fluoroscopic Angle for Delivery:  LAO 2 CAU 3 IMPRESSION: The study quality is affected by motion artifact. 1. Trileaflet, severely thickened and calcified aortic valve with severely restricted leaflet opening and minimal calcifications extending into the LVOT. Annular measurements suitable for delivery of a 29 mm Edwards-SAPIEN 3 valve. 2.  Sufficient annulus to coronary distance. 3.  Optimum Fluoroscopic Angle for Delivery:  LAO 2 CAU 3 4.  No thrombus in the left atrial appendage. Tobias Alexander Electronically Signed   By: Tobias Alexander   On: 04/07/2017 19:50   Result Date: 04/07/2017 EXAM: OVER-READ INTERPRETATION  CT CHEST The following report is an over-read performed by radiologist Dr. Royal Piedra [facility] on 04/06/2017. This over-read does not include interpretation of cardiac or coronary anatomy or pathology. The coronary calcium score/coronary CTA interpretation by the cardiologist is attached. COMPARISON:  No priors. FINDINGS: Extracardiac findings will be described separately under dictation for contemporaneously obtained CTA of the chest, abdomen and pelvis. IMPRESSION: Please see separate dictation for contemporaneously obtained CTA of the chest, abdomen and pelvis 04/06/2017 for full description of extracardiac findings. Electronically Signed: By: Trudie Reed M.D. On: 04/06/2017 11:02   Ct Angio Chest Aorta W/cm &/or Wo/cm  Result Date: 04/06/2017 CLINICAL DATA:  76 year old male with history of severe aortic stenosis. Increasing shortness of breath on exertion for the past 3-4 months. Preprocedural study prior to potential transcatheter aortic valve replacement (TAVR) procedure. EXAM: CT ANGIOGRAPHY CHEST, ABDOMEN AND PELVIS TECHNIQUE: Multidetector CT imaging through the chest, abdomen and pelvis was performed using the standard protocol during bolus administration of intravenous contrast. Multiplanar reconstructed images and MIPs were obtained and reviewed to evaluate the vascular anatomy. CONTRAST:  80 mL of Isovue 370. COMPARISON:  None. FINDINGS: CTA CHEST FINDINGS Cardiovascular: Heart size is mildly enlarged with left atrial dilatation. Filling defect in the tip of the left atrial appendage (axial  image 97 of series 5), highly concerning for left atrial appendage thrombus. There is no significant pericardial fluid, thickening or pericardial calcification. There is aortic atherosclerosis, as well as atherosclerosis of the great vessels of the mediastinum and the coronary arteries, including calcified atherosclerotic plaque in the left main, left anterior descending, left circumflex and right coronary arteries. Patient is status post median sternotomy for CABG including LIMA to the LAD. Mediastinum/Lymph Nodes: Mildly enlarged low right paratracheal lymph node measuring 12 mm in short axis. No other mediastinal are hilar lymphadenopathy. Esophagus is unremarkable in appearance. No axillary lymphadenopathy. Lungs/Pleura: No suspicious appearing pulmonary nodules or masses. No acute consolidative airspace disease. No pleural effusions. Musculoskeletal/Soft Tissues: Old healed fracture of the left clavicle. There are no aggressive appearing lytic or blastic lesions noted in the visualized portions of the skeleton. CTA ABDOMEN AND PELVIS FINDINGS  Hepatobiliary: No definite cystic or solid hepatic lesions. No intra or extrahepatic biliary ductal dilatation. Status post cholecystectomy. Pancreas: Well-defined 12 mm low-attenuation lesion in the body of the pancreas (axial image 183 of series 5). No other suspicious appearing pancreatic mass. No pancreatic ductal dilatation. No pancreatic or peripancreatic fluid or inflammatory changes. Spleen: The wedge-shaped hypovascular area in the lateral aspect of the spleen could reflect a recent splenic infarct, best appreciated on image 164 of series 5 and coronal image 96 of series 8). Adrenals/Urinary Tract: 2 wedge-shaped areas of hypoenhancement are noted in the lower pole of the left kidney, concerning for potential recent infarcts. Subcentimeter low-attenuation lesion in the upper pole the right kidney is too small to definitively characterize. 1.9 cm parapelvic cyst in the interpolar region of the right kidney. No hydroureteronephrosis. Urinary bladder is normal in appearance. Stomach/Bowel: Normal appearance of the stomach. No pathologic dilatation of small bowel or colon. The appendix is not confidently identified and may be surgically absent. Regardless, there are no inflammatory changes noted adjacent to the cecum to suggest the presence of an acute appendicitis at this time. Vascular/Lymphatic: Aortic atherosclerosis, without evidence of aneurysm or dissection in the abdominal or pelvic vasculature. Vascular findings and measurements pertinent to potential TAVR procedure, as detailed below. Celiac axis, superior mesenteric artery and inferior mesenteric artery are all widely patent without hemodynamically significant stenosis. Single left and two right renal arteries are widely patent. No lymphadenopathy noted in the abdomen or pelvis. Reproductive: Prostate gland and seminal vesicles are unremarkable in appearance. Other: No significant volume of ascites.  No pneumoperitoneum. Musculoskeletal: There are no  aggressive appearing lytic or blastic lesions noted in the visualized portions of the skeleton. VASCULAR MEASUREMENTS PERTINENT TO TAVR: AORTA: Minimal Aortic Diameter -  13 x 16 mm Severity of Aortic Calcification -  severe RIGHT PELVIS: Right Common Iliac Artery - Minimal Diameter - 8.9 x 6.1 mm Tortuosity - mild Calcification - moderate Right External Iliac Artery - Minimal Diameter - 8.6 x 7.8 mm Tortuosity - mild Calcification - mild Right Common Femoral Artery - Minimal Diameter - 8.1 x 6.3 mm Tortuosity - mild Calcification - mild LEFT PELVIS: Left Common Iliac Artery - Minimal Diameter - 9.5 x 8.9 mm Tortuosity - mild Calcification - moderate Left External Iliac Artery - Minimal Diameter - 7.9 x 6.3 mm Tortuosity - mild Calcification - mild Left Common Femoral Artery - Minimal Diameter - 8.6 x 5.2 mm Tortuosity - mild Calcification - mild Review of the MIP images confirms the above findings. IMPRESSION: 1. Vascular findings and measurements pertinent to potential TAVR procedure, as detailed above. This patient does  appear to have suitable pelvic arterial access bilaterally. 2. Cardiomegaly with left atrial dilatation. Notably, there is a filling defect in the tip of the left atrial appendage highly concerning for left atrial appendage thrombus. If present, this places the patient at risk for potential systemic embolization. Correlation with transesophageal echocardiography is strongly recommended if clinically appropriate. 3. Importantly, there are peripheral wedge-shaped hypovascular areas in both the spleen and the lower pole of the left kidney, which could reflect recent embolic infarcts. 4. Severe thickening calcification of the aortic valve, compatible with the reported clinical history of aortic stenosis. 5. Aortic atherosclerosis, in addition to left main and 3 vessel coronary artery disease. Status post median sternotomy for CABG including LIMA to the LAD. 6. Mildly enlarged low right paratracheal  lymph node. This is nonspecific, and could be chronic and reactive in this patient with prior history of sternotomy. No definite other findings to suggest malignancy noted elsewhere in the chest, abdomen or pelvis. 7. 12 mm low-attenuation lesion in the body of the pancreas. This is nonspecific. Follow-up pancreatic protocol CT or MRI with contrast is recommended in 2 years to ensure the stability of this lesion. This recommendation follows ACR consensus guidelines: Management of Incidental Pancreatic Cysts: A White Paper of the ACR Incidental Findings Committee. J Am Coll Radiol 2017;14:911-923. 8. Additional incidental findings, as above. Electronically Signed   By: Trudie Reed M.D.   On: 04/06/2017 12:20   Ct Angio Abd/pel W/ And/or W/o  Result Date: 04/06/2017 CLINICAL DATA:  76 year old male with history of severe aortic stenosis. Increasing shortness of breath on exertion for the past 3-4 months. Preprocedural study prior to potential transcatheter aortic valve replacement (TAVR) procedure. EXAM: CT ANGIOGRAPHY CHEST, ABDOMEN AND PELVIS TECHNIQUE: Multidetector CT imaging through the chest, abdomen and pelvis was performed using the standard protocol during bolus administration of intravenous contrast. Multiplanar reconstructed images and MIPs were obtained and reviewed to evaluate the vascular anatomy. CONTRAST:  80 mL of Isovue 370. COMPARISON:  None. FINDINGS: CTA CHEST FINDINGS Cardiovascular: Heart size is mildly enlarged with left atrial dilatation. Filling defect in the tip of the left atrial appendage (axial image 97 of series 5), highly concerning for left atrial appendage thrombus. There is no significant pericardial fluid, thickening or pericardial calcification. There is aortic atherosclerosis, as well as atherosclerosis of the great vessels of the mediastinum and the coronary arteries, including calcified atherosclerotic plaque in the left main, left anterior descending, left circumflex  and right coronary arteries. Patient is status post median sternotomy for CABG including LIMA to the LAD. Mediastinum/Lymph Nodes: Mildly enlarged low right paratracheal lymph node measuring 12 mm in short axis. No other mediastinal are hilar lymphadenopathy. Esophagus is unremarkable in appearance. No axillary lymphadenopathy. Lungs/Pleura: No suspicious appearing pulmonary nodules or masses. No acute consolidative airspace disease. No pleural effusions. Musculoskeletal/Soft Tissues: Old healed fracture of the left clavicle. There are no aggressive appearing lytic or blastic lesions noted in the visualized portions of the skeleton. CTA ABDOMEN AND PELVIS FINDINGS Hepatobiliary: No definite cystic or solid hepatic lesions. No intra or extrahepatic biliary ductal dilatation. Status post cholecystectomy. Pancreas: Well-defined 12 mm low-attenuation lesion in the body of the pancreas (axial image 183 of series 5). No other suspicious appearing pancreatic mass. No pancreatic ductal dilatation. No pancreatic or peripancreatic fluid or inflammatory changes. Spleen: The wedge-shaped hypovascular area in the lateral aspect of the spleen could reflect a recent splenic infarct, best appreciated on image 164 of series 5 and coronal image 96  of series 8). Adrenals/Urinary Tract: 2 wedge-shaped areas of hypoenhancement are noted in the lower pole of the left kidney, concerning for potential recent infarcts. Subcentimeter low-attenuation lesion in the upper pole the right kidney is too small to definitively characterize. 1.9 cm parapelvic cyst in the interpolar region of the right kidney. No hydroureteronephrosis. Urinary bladder is normal in appearance. Stomach/Bowel: Normal appearance of the stomach. No pathologic dilatation of small bowel or colon. The appendix is not confidently identified and may be surgically absent. Regardless, there are no inflammatory changes noted adjacent to the cecum to suggest the presence of an  acute appendicitis at this time. Vascular/Lymphatic: Aortic atherosclerosis, without evidence of aneurysm or dissection in the abdominal or pelvic vasculature. Vascular findings and measurements pertinent to potential TAVR procedure, as detailed below. Celiac axis, superior mesenteric artery and inferior mesenteric artery are all widely patent without hemodynamically significant stenosis. Single left and two right renal arteries are widely patent. No lymphadenopathy noted in the abdomen or pelvis. Reproductive: Prostate gland and seminal vesicles are unremarkable in appearance. Other: No significant volume of ascites.  No pneumoperitoneum. Musculoskeletal: There are no aggressive appearing lytic or blastic lesions noted in the visualized portions of the skeleton. VASCULAR MEASUREMENTS PERTINENT TO TAVR: AORTA: Minimal Aortic Diameter -  13 x 16 mm Severity of Aortic Calcification -  severe RIGHT PELVIS: Right Common Iliac Artery - Minimal Diameter - 8.9 x 6.1 mm Tortuosity - mild Calcification - moderate Right External Iliac Artery - Minimal Diameter - 8.6 x 7.8 mm Tortuosity - mild Calcification - mild Right Common Femoral Artery - Minimal Diameter - 8.1 x 6.3 mm Tortuosity - mild Calcification - mild LEFT PELVIS: Left Common Iliac Artery - Minimal Diameter - 9.5 x 8.9 mm Tortuosity - mild Calcification - moderate Left External Iliac Artery - Minimal Diameter - 7.9 x 6.3 mm Tortuosity - mild Calcification - mild Left Common Femoral Artery - Minimal Diameter - 8.6 x 5.2 mm Tortuosity - mild Calcification - mild Review of the MIP images confirms the above findings. IMPRESSION: 1. Vascular findings and measurements pertinent to potential TAVR procedure, as detailed above. This patient does appear to have suitable pelvic arterial access bilaterally. 2. Cardiomegaly with left atrial dilatation. Notably, there is a filling defect in the tip of the left atrial appendage highly concerning for left atrial appendage  thrombus. If present, this places the patient at risk for potential systemic embolization. Correlation with transesophageal echocardiography is strongly recommended if clinically appropriate. 3. Importantly, there are peripheral wedge-shaped hypovascular areas in both the spleen and the lower pole of the left kidney, which could reflect recent embolic infarcts. 4. Severe thickening calcification of the aortic valve, compatible with the reported clinical history of aortic stenosis. 5. Aortic atherosclerosis, in addition to left main and 3 vessel coronary artery disease. Status post median sternotomy for CABG including LIMA to the LAD. 6. Mildly enlarged low right paratracheal lymph node. This is nonspecific, and could be chronic and reactive in this patient with prior history of sternotomy. No definite other findings to suggest malignancy noted elsewhere in the chest, abdomen or pelvis. 7. 12 mm low-attenuation lesion in the body of the pancreas. This is nonspecific. Follow-up pancreatic protocol CT or MRI with contrast is recommended in 2 years to ensure the stability of this lesion. This recommendation follows ACR consensus guidelines: Management of Incidental Pancreatic Cysts: A White Paper of the ACR Incidental Findings Committee. J Am Coll Radiol 2017;14:911-923. 8. Additional incidental findings, as above. Electronically  Signed   By: Trudie Reedaniel  Entrikin M.D.   On: 04/06/2017 12:20     I have independently reviewed the above radiologic studies.  Recent Lab Findings: Lab Results  Component Value Date   WBC 4.7 05/02/2017   HGB 13.4 05/02/2017   HCT 41.5 05/02/2017   PLT 82 (L) 05/02/2017   GLUCOSE 108 (H) 05/02/2017   CHOL 122 08/20/2013   TRIG 143 08/20/2013   HDL 31 (L) 08/20/2013   LDLCALC 62 08/20/2013   ALT 28 05/02/2017   AST 29 05/02/2017   NA 139 05/02/2017   K 4.1 05/02/2017   CL 107 05/02/2017   CREATININE 0.88 05/02/2017   BUN 16 05/02/2017   CO2 23 05/02/2017   TSH 0.763 Test  methodology is 3rd generation TSH 02/05/2008   INR 2.23 05/02/2017   Procedures   Right/Left Heart Cath and Coronary Angiography  Conclusion     There is severe aortic valve stenosis.   1. Severe aortic stenosis with a mean transvalvular gradient of 43 mmHg and calculated AVA 0.92 square cm 2. 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 S/P CABG with continued patency of the SVG-diagonal and LIMA-LAD 4. Elevated intracardiac pressures consistent with chronic combined systolic and diastolic heart failure  Case discussed with Dr Diona BrownerMcDowell. Will move forward with further evaluation for aortic valve replacement/TAVR for treatment of severe symptomatic aortic stenosis    I have independently reviewed the above  cath films and reviewed the findings with the  patient .  Transthoracic Echocardiography  Patient:    Clifford King, Clifford King MR #:       [id number] Study Date: 02/21/2017 Gender:     M Age:        [age] Height:     195.6 cm Weight:     125.6 kg BSA:        2.64 m^2 Pt. Status: Room:   ATTENDING    Nona DellSamuel McDowell, M.D.  ORDERING     Nona DellSamuel McDowell, M.D.  REFERRING    Nona DellSamuel McDowell, M.D.  SONOGRAPHER  [facility]  PERFORMING   [facility], [facility] HawkingAnnie Penn  cc:  ------------------------------------------------------------------- LV EF: 45% -   50%  ------------------------------------------------------------------- Indications:      CAD of native vessels 414.01.  ------------------------------------------------------------------- History:   PMH:  Former Smoker.  Atrial fibrillation.  Coronary artery disease.  Risk factors:  Hypertension. Dyslipidemia.  ------------------------------------------------------------------- Study Conclusions  - Left ventricle: The cavity size was normal. There was mild   concentric hypertrophy. Systolic function was low normal to   mildly reduced. The estimated ejection fraction was in the range   of  45% to 50%. Diffuse hypokinesis. The study was not technically   sufficient to allow evaluation of LV diastolic dysfunction due to   atrial fibrillation. - Aortic valve: Peak velocity (S): 415 cm/s. Mean gradient (S): 41   mm Hg. Valve area (VTI): 0.6 cm^2. Valve area (Vmax): 0.59 cm^2.   Valve area (Vmean): 0.59 cm^2. - Mitral valve: Mildly calcified annulus. There was mild   regurgitation. - Left atrium: The atrium was severely dilated. - Right ventricle: Systolic function was mildly reduced. - Right atrium: The atrium was mildly dilated. - Pulmonary arteries: PA peak pressure: 40 mm Hg (S).  ------------------------------------------------------------------- Study data:  No prior study was available for comparison.  Study status:  Routine.  Procedure:  Transthoracic echocardiography. Image quality was adequate.          Transthoracic echocardiography.  M-mode, complete 2D,  spectral Doppler, and color Doppler.  Birthdate:  Patient birthdate: July 30, 1941.  Age:  Patient is 76 yr old.  Sex:  Gender: male.    BMI: 32.8 kg/m^2.  Patient status:  Outpatient.  Study date:  Study date: 02/21/2017. Study time: 09:54 AM.  Location:  Echo laboratory.  -------------------------------------------------------------------  ------------------------------------------------------------------- Left ventricle:  The cavity size was normal. There was mild concentric hypertrophy. Systolic function was low normal to mildly reduced. The estimated ejection fraction was in the range of 45% to 50%. Diffuse hypokinesis. The study was not technically sufficient to allow evaluation of LV diastolic dysfunction due to atrial fibrillation. There was no evidence of elevated ventricular filling pressure by Doppler parameters.  ------------------------------------------------------------------- Aortic valve:   Severely calcified leaflets.  Doppler:  There was no regurgitation.    VTI ratio of LVOT to aortic  valve: 0.19. Valve area (VTI): 0.6 cm^2. Indexed valve area (VTI): 0.23 cm^2/m^2. Peak velocity ratio of LVOT to aortic valve: 0.19. Valve area (Vmax): 0.59 cm^2. Indexed valve area (Vmax): 0.22 cm^2/m^2. Mean velocity ratio of LVOT to aortic valve: 0.19. Valve area (Vmean): 0.59 cm^2. Indexed valve area (Vmean): 0.22 cm^2/m^2.    Mean gradient (S): 41 mm Hg. Peak gradient (S): 69 mm Hg.  ------------------------------------------------------------------- Aorta:  Aortic root: The aortic root was normal in size.  ------------------------------------------------------------------- Mitral valve:   Mildly calcified annulus. Leaflet separation was normal.  Doppler:  Transvalvular velocity was within the normal range. There was no evidence for stenosis. There was mild regurgitation.    Peak gradient (D): 5 mm Hg.  ------------------------------------------------------------------- Left atrium:  The atrium was severely dilated.  ------------------------------------------------------------------- Right ventricle:  The cavity size was normal. Systolic function was mildly reduced.  ------------------------------------------------------------------- Pulmonic valve:   Poorly visualized.  Doppler:  There was no significant regurgitation.  ------------------------------------------------------------------- Tricuspid valve:   The valve appears to be grossly normal. Doppler:  There was trivial regurgitation.  ------------------------------------------------------------------- Right atrium:  The atrium was mildly dilated.  ------------------------------------------------------------------- Pericardium:  Not well visualized.  ------------------------------------------------------------------- Systemic veins: Inferior vena cava: The vessel was dilated. The respirophasic diameter changes were blunted (< 50%), consistent with elevated central venous  pressure.  ------------------------------------------------------------------- Measurements   Left ventricle                           Value          Reference  LV ID, ED, PLAX chordal          (H)     56.7  mm       43 - 52  LV ID, ES, PLAX chordal          (H)     45.6  mm       23 - 38  LV fx shortening, PLAX chordal   (L)     20    %        >=29  LV PW thickness, ED                      13.4  mm       ----------  IVS/LV PW ratio, ED                      0.86           <=1.3  Stroke volume, 2D  70    ml       ----------  Stroke volume/bsa, 2D                    26    ml/m^2   ----------  LV e&', lateral                           8.38  cm/s     ----------  LV E/e&', lateral                         13.37          ----------  LV e&', medial                            7.4   cm/s     ----------  LV E/e&', medial                          15.14          ----------  LV e&', average                           7.89  cm/s     ----------  LV E/e&', average                         14.2           ----------    Ventricular septum                       Value          Reference  IVS thickness, ED                        11.5  mm       ----------    LVOT                                     Value          Reference  LVOT ID, S                               20    mm       ----------  LVOT area                                3.14  cm^2     ----------  LVOT peak velocity, S                    78.1  cm/s     ----------  LVOT mean velocity, S                    54.5  cm/s     ----------  LVOT VTI, S                              22.3  cm       ----------  LVOT peak gradient, S                    2     mm Hg    ----------    Aortic valve                             Value          Reference  Aortic valve peak velocity, S            415   cm/s     ----------  Aortic valve mean velocity, S            290   cm/s     ----------  Aortic valve VTI, S                      117   cm        ----------  Aortic mean gradient, S                  39    mm Hg    ----------  Aortic peak gradient, S                  69    mm Hg    ----------  VTI ratio, LVOT/AV                       0.19           ----------  Aortic valve area, VTI                   0.6   cm^2     ----------  Aortic valve area/bsa, VTI               0.23  cm^2/m^2 ----------  Velocity ratio, peak, LVOT/AV            0.19           ----------  Aortic valve area, peak velocity         0.59  cm^2     ----------  Aortic valve area/bsa, peak              0.22  cm^2/m^2 ----------  velocity  Velocity ratio, mean, LVOT/AV            0.19           ----------  Aortic valve area, mean velocity         0.59  cm^2     ----------  Aortic valve area/bsa, mean              0.22  cm^2/m^2 ----------  velocity    Aorta                                    Value          Reference  Aortic root ID, ED                       36    mm       ----------    Left atrium                              Value  Reference  LA ID, A-P, ES                           62    mm       ----------  LA ID/bsa, A-P                   (H)     2.35  cm/m^2   <=2.2  LA volume, S                             173   ml       ----------  LA volume/bsa, S                         65.4  ml/m^2   ----------  LA volume, ES, 1-p A4C                   158   ml       ----------  LA volume/bsa, ES, 1-p A4C               59.8  ml/m^2   ----------  LA volume, ES, 1-p A2C                   171   ml       ----------  LA volume/bsa, ES, 1-p A2C               64.7  ml/m^2   ----------    Mitral valve                             Value          Reference  Mitral E-wave peak velocity              112   cm/s     ----------  Mitral A-wave peak velocity              49    cm/s     ----------  Mitral deceleration time         (H)     261   ms       150 - 230  Mitral peak gradient, D                  5     mm Hg    ----------  Mitral E/A ratio, peak                   2.3             ----------    Pulmonary arteries                       Value          Reference  PA pressure, S, DP               (H)     40    mm Hg    <=30    Tricuspid valve                          Value          Reference  Tricuspid regurg peak velocity  250   cm/s     ----------  Tricuspid peak RV-RA gradient            25    mm Hg    ----------    Right atrium                             Value          Reference  RA ID, S-I, ES, A4C              (H)     63.9  mm       34 - 49  RA area, ES, A4C                 (H)     34.7  cm^2     8.3 - 19.5  RA volume, ES, A/L                       152   ml       ----------  RA volume/bsa, ES, A/L                   57.5  ml/m^2   ----------    Systemic veins                           Value          Reference  Estimated CVP                            3     mm Hg    ----------    Right ventricle                          Value          Reference  TAPSE                                    18.9  mm       ----------  RV pressure, S, DP                       28    mm Hg    <=30  RV s&', lateral, S                        7.34  cm/s     ----------  Legend: (L)  and  (H)  mark values outside specified reference range.  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      I  spent 30 minutes counseling  the patient face to face and 50% or more the  time was spent in counseling and coordination of care. The total time spent in the appointment was 40 minutes.  Delight Ovens MD      [address] [place] [address] Office [phone]   Beeper [phone]  05/02/2017 4:26 PM

## 2017-05-03 LAB — HEMOGLOBIN A1C
HEMOGLOBIN A1C: 6.1 % — AB (ref 4.8–5.6)
Mean Plasma Glucose: 128 mg/dL

## 2017-05-03 NOTE — Progress Notes (Addendum)
Anesthesia Chart Review: Patient is a TAVR, transfemoral approach on 05/09/17 by Dr. Excell Seltzerooper and Dr. Cornelius Moraswen.   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 Question of possible LA appendage thrombus by 04/06/17 CT; however not confirmed by cardiac CT done that same day. BMI is consistent with obesity.   - PCP is Dr. Chana BodeAyokunle Fatade. [place]. I confirmed Dr. Philomena DohenyFatade's phone number with patient as [phone], although with multiple attempts I was unable to get an answer or answering machine when I called this number. (An alternate number found online was no longer in service.) - Cardi[facility].  Meds include warfarin (last dose 05/03/17), Lovenox bridge (to start 05/04/17), allopurinol, amlodipine, ASA 81 mg, Lasix (not taking), KCL (not taking), lisinopril, Toprl XL, Zocor.   BP 137/66   Pulse 74   Temp 36.7 C   Resp 18   Ht 6\' 5"  (1.956 m)   Wt 263 lb 1.6 oz (119.3 kg)   SpO2 95%   BMI 31.20 kg/m   EKG 05/02/17: Afib at 70 bpm, PVCs or aberrantly conducted complexes, left BBB. No significant change since last tracing.  Echocardiogram 02/21/2017: Study Conclusions - Left ventricle: The cavity size was normal. There was mild concentric hypertrophy. Systolic function was low normal to mildly reduced. The estimated ejection fraction was in the range of 45% to 50%. Diffuse hypokinesis. The study was not technically sufficient to allow evaluation of LV diastolic dysfunction due to atrial fibrillation. - Aortic valve: Peak velocity (S): 415 cm/s. Mean gradient (S):  41 mm Hg. Valve area (VTI): 0.6 cm^2. Valve area (Vmax): 0.59 cm^2. Valve area (Vmean): 0.59 cm^2. - Mitral valve: Mildly calcified annulus. There was mild regurgitation. - Left atrium: The atrium was severely dilated. - Right ventricle: Systolic function was mildly reduced. - Right atrium: The atrium was mildly dilated. - Pulmonary arteries: PA peak pressure: 40 mm Hg (S).  Cardiac catheterization 03/29/2017: 1. Severe aortic stenosis with a mean transvalvular gradient of 43 mmHg and calculated AVA 0.92 square cm 2. 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 S/P CABG with continued patency of the SVG-diagonal and LIMA-LAD 4. Elevated intracardiac pressures consistent with chronic combined systolic and diastolic heart failure  Cardiac CT 04/06/2017: IMPRESSION: The study quality is affected by motion artifact. 1. Trileaflet, severely thickened and calcified aortic valve with severely restricted leaflet opening and minimal calcifications extending into the LVOT. Annular measurements suitable for delivery of a 29 mm Edwards-SAPIEN 3 valve. 2. Sufficient annulus to coronary distance. 3. Optimum Fluoroscopic Angle for Delivery: LAO 2 CAU 3 4. No thrombus in the left atrial appendage.  Carotid U/S 08/25/16: Impression: Heterogeneous plaque, bilaterally. Stable 1-39% ICA stenosis, bilaterally. Normal SCA bilaterally. Patent vertebral arteries with antegrade flow. F/U PRN.  CT Chest/Abd/Pelvis 04/06/17: IMPRESSION: 1. Vascular findings and measurements pertinent to potential TAVR procedure, as detailed above. This patient does appear to have suitable pelvic arterial access bilaterally. 2. Cardiomegaly with left atrial dilatation. Notably, there is a filling defect in the tip of the left atrial appendage highly concerning for left atrial appendage thrombus. If present, this places the patient  at risk for potential systemic  embolization. Correlation with transesophageal echocardiography is strongly recommended if clinically appropriate. 3. Importantly, there are peripheral wedge-shaped hypovascular areas in both the spleen and the lower pole of the left kidney, which could reflect recent embolic infarcts. 4. Severe thickening calcification of the aortic valve, compatible with the reported clinical history of aortic stenosis. 5. Aortic atherosclerosis, in addition to left main and 3 vessel coronary artery disease. Status post median sternotomy for CABG including LIMA to the LAD. 6. Mildly enlarged low right paratracheal lymph node. This is nonspecific, and could be chronic and reactive in this patient with prior history of sternotomy. No definite other findings to suggest malignancy noted elsewhere in the chest, abdomen or pelvis. 7. 12 mm low-attenuation lesion in the body of the pancreas. This is nonspecific. Follow-up pancreatic protocol CT or MRI with contrast is recommended in 2 years to ensure the stability of this lesion.  Preoperative labs noted. Cr 0.88. H/H 13.4/41.5. PLT 82K (comparison PLT count 135K 02/06/08, 143 10/05/10, 150 06/27/12), PT 25.1, INR 2.23. PTT 40. A1c 6.1. T&S done. UA WNL. Patient is unaware of any history of thrombocytopenia. I have routed CBC results to Dr. Excell Seltzer and Dr. Cornelius Moras inquiring if any new preoperative recommendations or if will be followed and addressed post-operatively. I will plan to update note if any pertinent records or input received. (Update 05/04/17 9:38 AM: PLT count 105K on 03/21/17 at [facility]. Dr. Excell Seltzer has noted labs. Thrombocytopenia will be followed and addressed post-operatively.)  Velna Ochs [facility] Short Stay [facility]/Anesthesiology Phone [phone] 05/03/2017 12:08 PM

## 2017-05-04 ENCOUNTER — Ambulatory Visit (INDEPENDENT_AMBULATORY_CARE_PROVIDER_SITE_OTHER): Payer: Medicare Other | Admitting: *Deleted

## 2017-05-04 DIAGNOSIS — I4811 Longstanding persistent atrial fibrillation: Secondary | ICD-10-CM

## 2017-05-04 DIAGNOSIS — I25119 Atherosclerotic heart disease of native coronary artery with unspecified angina pectoris: Secondary | ICD-10-CM | POA: Diagnosis not present

## 2017-05-04 DIAGNOSIS — I481 Persistent atrial fibrillation: Secondary | ICD-10-CM | POA: Diagnosis not present

## 2017-05-04 DIAGNOSIS — Z7901 Long term (current) use of anticoagulants: Secondary | ICD-10-CM

## 2017-05-04 DIAGNOSIS — I4891 Unspecified atrial fibrillation: Secondary | ICD-10-CM | POA: Diagnosis not present

## 2017-05-04 DIAGNOSIS — Z5181 Encounter for therapeutic drug level monitoring: Secondary | ICD-10-CM

## 2017-05-04 LAB — POCT INR: INR: 2.5

## 2017-05-04 NOTE — Patient Instructions (Signed)
Scheduled for TAVR 05/09/17  Labs 03/15/17: SCr 0.79 Hgb 13.6  HCT 41.1 CrCl  141.35 ml/min Wt.  120kg  6/20  Last dose of coumadin 6/21  No lovenox or coumadin 6/22  Lovenox 120mg  sq at 8am & 8pm 6/23  Lovenox 120mg  sq at 8am & 8pm 6/24  Lovenox 120mg  sq at 8am & 8pm 6/25  Lovenox 120mg  sq at 8am &  No lovenox in pm 6/26  No lovenox in am-------procedure------hospital admission

## 2017-05-08 MED ORDER — SODIUM CHLORIDE 0.9 % IV SOLN
INTRAVENOUS | Status: DC
  Filled 2017-05-08: qty 1

## 2017-05-08 MED ORDER — POTASSIUM CHLORIDE 2 MEQ/ML IV SOLN
80.0000 meq | INTRAVENOUS | Status: DC
  Filled 2017-05-08: qty 40

## 2017-05-08 MED ORDER — MAGNESIUM SULFATE 50 % IJ SOLN
40.0000 meq | INTRAMUSCULAR | Status: DC
  Filled 2017-05-08: qty 10

## 2017-05-08 MED ORDER — SODIUM CHLORIDE 0.9 % IV SOLN
30.0000 ug/min | INTRAVENOUS | Status: DC
  Filled 2017-05-08: qty 2

## 2017-05-08 MED ORDER — VANCOMYCIN HCL 10 G IV SOLR
1500.0000 mg | INTRAVENOUS | Status: AC
  Administered 2017-05-09: 1500 mg via INTRAVENOUS
  Filled 2017-05-08: qty 1500

## 2017-05-08 MED ORDER — DEXTROSE 5 % IV SOLN
1.5000 g | INTRAVENOUS | Status: AC
  Administered 2017-05-09: 1.5 g via INTRAVENOUS
  Filled 2017-05-08: qty 1.5

## 2017-05-08 MED ORDER — CHLORHEXIDINE GLUCONATE 0.12 % MT SOLN
15.0000 mL | Freq: Once | OROMUCOSAL | Status: AC
  Administered 2017-05-09: 15 mL via OROMUCOSAL
  Filled 2017-05-08: qty 15

## 2017-05-08 MED ORDER — DEXMEDETOMIDINE HCL IN NACL 400 MCG/100ML IV SOLN
0.1000 ug/kg/h | INTRAVENOUS | Status: AC
Start: 2017-05-09 — End: 2017-05-09
  Administered 2017-05-09: 1.5 ug/kg/h via INTRAVENOUS
  Filled 2017-05-08: qty 100

## 2017-05-08 MED ORDER — EPINEPHRINE PF 1 MG/ML IJ SOLN
0.0000 ug/min | INTRAMUSCULAR | Status: DC
  Filled 2017-05-08: qty 4

## 2017-05-08 MED ORDER — DOPAMINE-DEXTROSE 3.2-5 MG/ML-% IV SOLN
0.0000 ug/kg/min | INTRAVENOUS | Status: DC
  Filled 2017-05-08: qty 250

## 2017-05-08 MED ORDER — SODIUM CHLORIDE 0.9 % IV SOLN: INTRAVENOUS | Status: DC

## 2017-05-08 MED ORDER — SODIUM CHLORIDE 0.9 % IV SOLN
INTRAVENOUS | Status: DC
  Filled 2017-05-08: qty 30

## 2017-05-08 MED ORDER — NITROGLYCERIN IN D5W 200-5 MCG/ML-% IV SOLN
2.0000 ug/min | INTRAVENOUS | Status: DC
  Filled 2017-05-08: qty 250

## 2017-05-08 MED ORDER — NOREPINEPHRINE BITARTRATE 1 MG/ML IV SOLN
0.0000 ug/min | INTRAVENOUS | Status: DC
  Filled 2017-05-08: qty 4

## 2017-05-08 NOTE — Anesthesia Preprocedure Evaluation (Addendum)
Anesthesia Evaluation  Patient identified by MRN, date of birth, ID band Patient awake    Reviewed: Allergy & Precautions, H&P , NPO status , Patient's Chart, lab work & pertinent test results  Airway Mallampati: II  TM Distance: >3 FB Neck ROM: Full    Dental no notable dental hx. (+) Teeth Intact, Dental Advisory Given   Pulmonary neg pulmonary ROS, former smoker,    Pulmonary exam normal breath sounds clear to auscultation       Cardiovascular Exercise Tolerance: Good hypertension, Pt. on medications and Pt. on home beta blockers + CAD, + CABG, + Peripheral Vascular Disease and +CHF  + dysrhythmias Atrial Fibrillation + Valvular Problems/Murmurs AS  Rhythm:Regular Rate:Normal + Systolic murmurs    Neuro/Psych negative neurological ROS  negative psych ROS   GI/Hepatic negative GI ROS, Neg liver ROS,   Endo/Other  negative endocrine ROS  Renal/GU negative Renal ROS  negative genitourinary   Musculoskeletal  (+) Arthritis , Osteoarthritis,    Abdominal   Peds  Hematology negative hematology ROS (+)   Anesthesia Other Findings   Reproductive/Obstetrics negative OB ROS                            Anesthesia Physical Anesthesia Plan  ASA: IV  Anesthesia Plan: MAC   Post-op Pain Management:    Induction: Intravenous  PONV Risk Score and Plan: 1 and Ondansetron, Propofol and Midazolam  Airway Management Planned: Simple Face Mask  Additional Equipment: Arterial line, CVP and Ultrasound Guidance Line Placement  Intra-op Plan:   Post-operative Plan:   Informed Consent: I have reviewed the patients History and Physical, chart, labs and discussed the procedure including the risks, benefits and alternatives for the proposed anesthesia with the patient or authorized representative who has indicated his/her understanding and acceptance.   Dental advisory given  Plan Discussed with:  CRNA  Anesthesia Plan Comments:       Anesthesia Quick Evaluation

## 2017-05-09 ENCOUNTER — Inpatient Hospital Stay (HOSPITAL_COMMUNITY): Payer: Medicare Other | Admitting: Anesthesiology

## 2017-05-09 ENCOUNTER — Inpatient Hospital Stay (HOSPITAL_COMMUNITY): Payer: Medicare Other

## 2017-05-09 ENCOUNTER — Inpatient Hospital Stay (HOSPITAL_COMMUNITY): Payer: Medicare Other | Admitting: Vascular Surgery

## 2017-05-09 ENCOUNTER — Other Ambulatory Visit: Payer: Self-pay

## 2017-05-09 ENCOUNTER — Encounter (HOSPITAL_COMMUNITY)
Admission: RE | Disposition: A | Discharge status: Home / Self Care | Payer: Self-pay | Source: Ambulatory Visit | Attending: Thoracic Surgery (Cardiothoracic Vascular Surgery)

## 2017-05-09 ENCOUNTER — Encounter (HOSPITAL_COMMUNITY): Payer: Self-pay | Admitting: *Deleted

## 2017-05-09 ENCOUNTER — Inpatient Hospital Stay (HOSPITAL_COMMUNITY)
Admission: RE | Admit: 2017-05-09 | Discharge: 2017-05-11 | DRG: 267 | Disposition: A | Discharge status: Home / Self Care | Payer: Medicare Other | Source: Ambulatory Visit | Attending: Thoracic Surgery (Cardiothoracic Vascular Surgery) | Admitting: Thoracic Surgery (Cardiothoracic Vascular Surgery)

## 2017-05-09 DIAGNOSIS — I352 Nonrheumatic aortic (valve) stenosis with insufficiency: Principal | ICD-10-CM | POA: Diagnosis present

## 2017-05-09 DIAGNOSIS — Z7982 Long term (current) use of aspirin: Secondary | ICD-10-CM

## 2017-05-09 DIAGNOSIS — Z955 Presence of coronary angioplasty implant and graft: Secondary | ICD-10-CM

## 2017-05-09 DIAGNOSIS — I5042 Chronic combined systolic (congestive) and diastolic (congestive) heart failure: Secondary | ICD-10-CM | POA: Diagnosis present

## 2017-05-09 DIAGNOSIS — Z952 Presence of prosthetic heart valve: Secondary | ICD-10-CM | POA: Diagnosis not present

## 2017-05-09 DIAGNOSIS — Z87891 Personal history of nicotine dependence: Secondary | ICD-10-CM | POA: Diagnosis not present

## 2017-05-09 DIAGNOSIS — I110 Hypertensive heart disease with heart failure: Secondary | ICD-10-CM | POA: Diagnosis present

## 2017-05-09 DIAGNOSIS — Z7901 Long term (current) use of anticoagulants: Secondary | ICD-10-CM | POA: Diagnosis not present

## 2017-05-09 DIAGNOSIS — Z951 Presence of aortocoronary bypass graft: Secondary | ICD-10-CM | POA: Diagnosis not present

## 2017-05-09 DIAGNOSIS — Z79899 Other long term (current) drug therapy: Secondary | ICD-10-CM | POA: Diagnosis not present

## 2017-05-09 DIAGNOSIS — I4811 Longstanding persistent atrial fibrillation: Secondary | ICD-10-CM | POA: Diagnosis present

## 2017-05-09 DIAGNOSIS — I700 Atherosclerosis of aorta: Secondary | ICD-10-CM | POA: Diagnosis present

## 2017-05-09 DIAGNOSIS — I2510 Atherosclerotic heart disease of native coronary artery without angina pectoris: Secondary | ICD-10-CM | POA: Diagnosis present

## 2017-05-09 DIAGNOSIS — Z6832 Body mass index (BMI) 32.0-32.9, adult: Secondary | ICD-10-CM

## 2017-05-09 DIAGNOSIS — I350 Nonrheumatic aortic (valve) stenosis: Secondary | ICD-10-CM

## 2017-05-09 DIAGNOSIS — Z006 Encounter for examination for normal comparison and control in clinical research program: Secondary | ICD-10-CM

## 2017-05-09 DIAGNOSIS — I481 Persistent atrial fibrillation: Secondary | ICD-10-CM | POA: Diagnosis present

## 2017-05-09 DIAGNOSIS — I447 Left bundle-branch block, unspecified: Secondary | ICD-10-CM | POA: Diagnosis present

## 2017-05-09 DIAGNOSIS — I10 Essential (primary) hypertension: Secondary | ICD-10-CM | POA: Diagnosis present

## 2017-05-09 DIAGNOSIS — Z954 Presence of other heart-valve replacement: Secondary | ICD-10-CM | POA: Diagnosis not present

## 2017-05-09 DIAGNOSIS — J9811 Atelectasis: Secondary | ICD-10-CM

## 2017-05-09 DIAGNOSIS — Z885 Allergy status to narcotic agent status: Secondary | ICD-10-CM | POA: Diagnosis not present

## 2017-05-09 DIAGNOSIS — I340 Nonrheumatic mitral (valve) insufficiency: Secondary | ICD-10-CM | POA: Diagnosis not present

## 2017-05-09 HISTORY — PX: TEE WITHOUT CARDIOVERSION: SHX5443

## 2017-05-09 HISTORY — DX: Presence of prosthetic heart valve: Z95.2

## 2017-05-09 HISTORY — PX: TRANSCATHETER AORTIC VALVE REPLACEMENT, TRANSFEMORAL: SHX6400

## 2017-05-09 LAB — CBC
HCT: 38.4 % — ABNORMAL LOW (ref 39.0–52.0)
HEMOGLOBIN: 12.3 g/dL — AB (ref 13.0–17.0)
MCH: 28.7 pg (ref 26.0–34.0)
MCHC: 32 g/dL (ref 30.0–36.0)
MCV: 89.7 fL (ref 78.0–100.0)
Platelets: 76 10*3/uL — ABNORMAL LOW (ref 150–400)
RBC: 4.28 MIL/uL (ref 4.22–5.81)
RDW: 14.1 % (ref 11.5–15.5)
WBC: 2.9 10*3/uL — ABNORMAL LOW (ref 4.0–10.5)

## 2017-05-09 LAB — POCT I-STAT, CHEM 8
BUN: 14 mg/dL (ref 6–20)
BUN: 16 mg/dL (ref 6–20)
CHLORIDE: 102 mmol/L (ref 101–111)
CHLORIDE: 101 mmol/L (ref 101–111)
CREATININE: 0.8 mg/dL (ref 0.61–1.24)
CREATININE: 0.8 mg/dL (ref 0.61–1.24)
Calcium, Ion: 1.21 mmol/L (ref 1.15–1.40)
Calcium, Ion: 1.29 mmol/L (ref 1.15–1.40)
GLUCOSE: 161 mg/dL — AB (ref 65–99)
Glucose, Bld: 130 mg/dL — ABNORMAL HIGH (ref 65–99)
HCT: 37 % — ABNORMAL LOW (ref 39.0–52.0)
HEMATOCRIT: 37 % — AB (ref 39.0–52.0)
HEMOGLOBIN: 12.6 g/dL — AB (ref 13.0–17.0)
Hemoglobin: 12.6 g/dL — ABNORMAL LOW (ref 13.0–17.0)
POTASSIUM: 4.1 mmol/L (ref 3.5–5.1)
Potassium: 3.8 mmol/L (ref 3.5–5.1)
Sodium: 142 mmol/L (ref 135–145)
Sodium: 141 mmol/L (ref 135–145)
TCO2: 25 mmol/L (ref 0–100)
TCO2: 28 mmol/L (ref 0–100)

## 2017-05-09 LAB — POCT I-STAT 3, ART BLOOD GAS (G3+)
Acid-Base Excess: 2 mmol/L (ref 0.0–2.0)
BICARBONATE: 25.4 mmol/L (ref 20.0–28.0)
Bicarbonate: 27.2 mmol/L (ref 20.0–28.0)
O2 SAT: 92 %
O2 Saturation: 98 %
PCO2 ART: 40.8 mmHg (ref 32.0–48.0)
PH ART: 7.429 (ref 7.350–7.450)
PO2 ART: 109 mmHg — AB (ref 83.0–108.0)
PO2 ART: 60 mmHg — AB (ref 83.0–108.0)
Patient temperature: 97
TCO2: 27 mmol/L (ref 0–100)
TCO2: 28 mmol/L (ref 0–100)
pCO2 arterial: 41.6 mmHg (ref 32.0–48.0)
pH, Arterial: 7.394 (ref 7.350–7.450)

## 2017-05-09 LAB — APTT: aPTT: 35 seconds (ref 24–36)

## 2017-05-09 LAB — POCT I-STAT 4, (NA,K, GLUC, HGB,HCT)
Glucose, Bld: 148 mg/dL — ABNORMAL HIGH (ref 65–99)
HCT: 37 % — ABNORMAL LOW (ref 39.0–52.0)
Hemoglobin: 12.6 g/dL — ABNORMAL LOW (ref 13.0–17.0)
POTASSIUM: 4.1 mmol/L (ref 3.5–5.1)
SODIUM: 142 mmol/L (ref 135–145)

## 2017-05-09 LAB — PROTIME-INR
INR: 1.27
PROTHROMBIN TIME: 16 s — AB (ref 11.4–15.2)

## 2017-05-09 SURGERY — IMPLANTATION, AORTIC VALVE, TRANSCATHETER, FEMORAL APPROACH
Anesthesia: Monitor Anesthesia Care | Site: Chest

## 2017-05-09 MED ORDER — SODIUM CHLORIDE 0.9 % IV SOLN
0.0000 ug/min | INTRAVENOUS | Status: DC
  Filled 2017-05-09: qty 2

## 2017-05-09 MED ORDER — ASPIRIN 81 MG PO CHEW: 324.0000 mg | CHEWABLE_TABLET | Freq: Every day | ORAL | Status: DC

## 2017-05-09 MED ORDER — SODIUM CHLORIDE 0.9 % IJ SOLN
INTRAMUSCULAR | Status: AC
  Filled 2017-05-09: qty 10

## 2017-05-09 MED ORDER — CHLORHEXIDINE GLUCONATE 0.12 % MT SOLN
15.0000 mL | OROMUCOSAL | Status: AC
  Administered 2017-05-09: 15 mL via OROMUCOSAL
  Filled 2017-05-09: qty 15

## 2017-05-09 MED ORDER — VANCOMYCIN HCL IN DEXTROSE 1-5 GM/200ML-% IV SOLN
1000.0000 mg | Freq: Once | INTRAVENOUS | Status: AC
  Administered 2017-05-09: 1000 mg via INTRAVENOUS
  Filled 2017-05-09: qty 200

## 2017-05-09 MED ORDER — MORPHINE SULFATE (PF) 2 MG/ML IV SOLN: 1.0000 mg | INTRAVENOUS | Status: DC | PRN

## 2017-05-09 MED ORDER — ASPIRIN EC 325 MG PO TBEC: 325.0000 mg | DELAYED_RELEASE_TABLET | Freq: Every day | ORAL | Status: DC

## 2017-05-09 MED ORDER — METOPROLOL TARTRATE 5 MG/5ML IV SOLN: 2.5000 mg | INTRAVENOUS | Status: DC | PRN

## 2017-05-09 MED ORDER — PROTAMINE SULFATE 10 MG/ML IV SOLN
INTRAVENOUS | Status: AC
  Filled 2017-05-09: qty 10

## 2017-05-09 MED ORDER — MIDAZOLAM HCL 5 MG/5ML IJ SOLN
INTRAMUSCULAR | Status: DC | PRN
  Administered 2017-05-09 (×2): 1 mg via INTRAVENOUS

## 2017-05-09 MED ORDER — ONDANSETRON HCL 4 MG/2ML IJ SOLN
INTRAMUSCULAR | Status: DC | PRN
  Administered 2017-05-09: 4 mg via INTRAVENOUS

## 2017-05-09 MED ORDER — DEXTROSE 5 % IV SOLN
1.5000 g | Freq: Two times a day (BID) | INTRAVENOUS | Status: AC
  Administered 2017-05-09 – 2017-05-11 (×4): 1.5 g via INTRAVENOUS
  Filled 2017-05-09 (×5): qty 1.5

## 2017-05-09 MED ORDER — ALBUMIN HUMAN 5 % IV SOLN
250.0000 mL | INTRAVENOUS | Status: AC | PRN
  Filled 2017-05-09: qty 250

## 2017-05-09 MED ORDER — LIDOCAINE HCL (PF) 1 % IJ SOLN
INTRAMUSCULAR | Status: AC
  Filled 2017-05-09: qty 30

## 2017-05-09 MED ORDER — CHLORHEXIDINE GLUCONATE 4 % EX LIQD: 30.0000 mL | CUTANEOUS | Status: DC

## 2017-05-09 MED ORDER — HEPARIN SODIUM (PORCINE) 1000 UNIT/ML IJ SOLN
INTRAMUSCULAR | Status: AC
  Filled 2017-05-09: qty 1

## 2017-05-09 MED ORDER — MIDAZOLAM HCL 2 MG/2ML IJ SOLN
INTRAMUSCULAR | Status: AC
  Filled 2017-05-09: qty 2

## 2017-05-09 MED ORDER — POTASSIUM CHLORIDE 10 MEQ/50ML IV SOLN: 10.0000 meq | INTRAVENOUS | Status: AC

## 2017-05-09 MED ORDER — METOPROLOL TARTRATE 12.5 MG HALF TABLET
12.5000 mg | ORAL_TABLET | Freq: Two times a day (BID) | ORAL | Status: DC
  Administered 2017-05-09 – 2017-05-11 (×3): 12.5 mg via ORAL
  Filled 2017-05-09 (×4): qty 1

## 2017-05-09 MED ORDER — TRAMADOL HCL 50 MG PO TABS
50.0000 mg | ORAL_TABLET | ORAL | Status: DC | PRN
  Administered 2017-05-11: 50 mg via ORAL
  Filled 2017-05-09: qty 1

## 2017-05-09 MED ORDER — HEPARIN SODIUM (PORCINE) 1000 UNIT/ML IJ SOLN
INTRAMUSCULAR | Status: AC
  Filled 2017-05-09: qty 3

## 2017-05-09 MED ORDER — IODIXANOL 320 MG/ML IV SOLN
INTRAVENOUS | Status: DC | PRN
  Administered 2017-05-09: 38.4 mL via INTRAVENOUS

## 2017-05-09 MED ORDER — ONDANSETRON HCL 4 MG/2ML IJ SOLN: 4.0000 mg | Freq: Four times a day (QID) | INTRAMUSCULAR | Status: DC | PRN

## 2017-05-09 MED ORDER — NITROGLYCERIN IN D5W 200-5 MCG/ML-% IV SOLN: 0.0000 ug/min | INTRAVENOUS | Status: DC

## 2017-05-09 MED ORDER — LACTATED RINGERS IV SOLN
INTRAVENOUS | Status: DC | PRN
  Administered 2017-05-09: 07:00:00 via INTRAVENOUS

## 2017-05-09 MED ORDER — FAMOTIDINE IN NACL 20-0.9 MG/50ML-% IV SOLN
20.0000 mg | Freq: Two times a day (BID) | INTRAVENOUS | Status: DC
  Administered 2017-05-09: 20 mg via INTRAVENOUS
  Filled 2017-05-09: qty 50

## 2017-05-09 MED ORDER — SODIUM CHLORIDE 0.9 % IV SOLN
INTRAVENOUS | Status: DC | PRN
  Administered 2017-05-09: 1500 mL

## 2017-05-09 MED ORDER — 0.9 % SODIUM CHLORIDE (POUR BTL) OPTIME
TOPICAL | Status: DC | PRN
  Administered 2017-05-09: 1000 mL

## 2017-05-09 MED ORDER — FENTANYL CITRATE (PF) 250 MCG/5ML IJ SOLN
INTRAMUSCULAR | Status: DC | PRN
  Administered 2017-05-09 (×3): 25 ug via INTRAVENOUS

## 2017-05-09 MED ORDER — SODIUM CHLORIDE 0.9 % IV SOLN: 1.0000 mL/kg/h | INTRAVENOUS | Status: AC

## 2017-05-09 MED ORDER — PROTAMINE SULFATE 10 MG/ML IV SOLN
INTRAVENOUS | Status: DC | PRN
  Administered 2017-05-09: 20 mg via INTRAVENOUS
  Administered 2017-05-09: 50 mg via INTRAVENOUS
  Administered 2017-05-09 (×2): 20 mg via INTRAVENOUS

## 2017-05-09 MED ORDER — LACTATED RINGERS IV SOLN: 500.0000 mL | Freq: Once | INTRAVENOUS | Status: DC | PRN

## 2017-05-09 MED ORDER — HEPARIN SODIUM (PORCINE) 1000 UNIT/ML IJ SOLN
INTRAMUSCULAR | Status: DC | PRN
  Administered 2017-05-09: 11000 [IU] via INTRAVENOUS

## 2017-05-09 MED ORDER — PANTOPRAZOLE SODIUM 40 MG PO TBEC
40.0000 mg | DELAYED_RELEASE_TABLET | Freq: Every day | ORAL | Status: DC
  Administered 2017-05-11: 40 mg via ORAL
  Filled 2017-05-09: qty 1

## 2017-05-09 MED ORDER — METOPROLOL TARTRATE 25 MG/10 ML ORAL SUSPENSION: 12.5000 mg | Freq: Two times a day (BID) | ORAL | Status: DC

## 2017-05-09 MED ORDER — OXYCODONE HCL 5 MG PO TABS: 5.0000 mg | ORAL_TABLET | ORAL | Status: DC | PRN

## 2017-05-09 MED ORDER — MORPHINE SULFATE (PF) 4 MG/ML IV SOLN: 1.0000 mg | INTRAVENOUS | Status: DC | PRN

## 2017-05-09 MED ORDER — DEXMEDETOMIDINE HCL IN NACL 400 MCG/100ML IV SOLN
0.1000 ug/kg/h | INTRAVENOUS | Status: DC
  Filled 2017-05-09: qty 100

## 2017-05-09 MED ORDER — PROPOFOL 10 MG/ML IV BOLUS
INTRAVENOUS | Status: AC
  Filled 2017-05-09: qty 20

## 2017-05-09 MED ORDER — LIDOCAINE HCL (PF) 1 % IJ SOLN
INTRAMUSCULAR | Status: DC | PRN
  Administered 2017-05-09: 4 mL

## 2017-05-09 MED ORDER — CHLORHEXIDINE GLUCONATE 4 % EX LIQD: 60.0000 mL | Freq: Once | CUTANEOUS | Status: DC

## 2017-05-09 MED ORDER — PROPOFOL 500 MG/50ML IV EMUL
INTRAVENOUS | Status: DC | PRN
  Administered 2017-05-09: 15 ug/kg/min via INTRAVENOUS

## 2017-05-09 MED ORDER — SODIUM CHLORIDE 0.9 % IV SOLN
1.0000 mL/kg/h | INTRAVENOUS | Status: AC
  Administered 2017-05-09: 1 mL/kg/h via INTRAVENOUS

## 2017-05-09 MED ORDER — FENTANYL CITRATE (PF) 250 MCG/5ML IJ SOLN
INTRAMUSCULAR | Status: AC
  Filled 2017-05-09: qty 5

## 2017-05-09 MED ORDER — SODIUM CHLORIDE 0.9 % IV SOLN: INTRAVENOUS | Status: DC

## 2017-05-09 MED ORDER — MIDAZOLAM HCL 2 MG/2ML IJ SOLN: 2.0000 mg | INTRAMUSCULAR | Status: DC | PRN

## 2017-05-09 MED FILL — Magnesium Sulfate Inj 50%: INTRAMUSCULAR | Qty: 10 | Status: AC

## 2017-05-09 MED FILL — Potassium Chloride Inj 2 mEq/ML: INTRAVENOUS | Qty: 10 | Status: AC

## 2017-05-09 MED FILL — Heparin Sodium (Porcine) Inj 1000 Unit/ML: INTRAMUSCULAR | Qty: 30 | Status: AC

## 2017-05-09 SURGICAL SUPPLY — 108 items
ADAPTER UNIV SWAN GANZ BIP (ADAPTER) ×2 IMPLANT
ADAPTER UNV SWAN GANZ BIP (ADAPTER) ×2
ADH SKN CLS APL DERMABOND .7 (GAUZE/BANDAGES/DRESSINGS) ×2
ADPR CATH UNV NS SG CATH (ADAPTER) ×2
APL SKNCLS STERI-STRIP NONHPOA (GAUZE/BANDAGES/DRESSINGS) ×2
ATTRACTOMAT 16X20 MAGNETIC DRP (DRAPES) IMPLANT
BAG BANDED W/RUBBER/TAPE 36X54 (MISCELLANEOUS) ×4 IMPLANT
BAG DECANTER FOR FLEXI CONT (MISCELLANEOUS) IMPLANT
BAG EQP BAND 135X91 W/RBR TAPE (MISCELLANEOUS) ×2
BAG SNAP BAND KOVER 36X36 (MISCELLANEOUS) ×8 IMPLANT
BENZOIN TINCTURE PRP APPL 2/3 (GAUZE/BANDAGES/DRESSINGS) ×3 IMPLANT
BLADE 10 SAFETY STRL DISP (BLADE) ×4 IMPLANT
BLADE CLIPPER SURG (BLADE) IMPLANT
BLADE STERNUM SYSTEM 6 (BLADE) ×4 IMPLANT
CABLE ADAPT CONN TEMP 6FT (ADAPTER) ×4 IMPLANT
CABLE PACING FASLOC BIEGE (MISCELLANEOUS) ×4 IMPLANT
CABLE PACING FASLOC BLUE (MISCELLANEOUS) ×4 IMPLANT
CANISTER SUCT 3000ML PPV (MISCELLANEOUS) IMPLANT
CANNULA FEM VENOUS REMOTE 22FR (CANNULA) IMPLANT
CANNULA OPTISITE PERFUSION 16F (CANNULA) IMPLANT
CANNULA OPTISITE PERFUSION 18F (CANNULA) IMPLANT
CATH DIAG EXPO 6F VENT PIG 145 (CATHETERS) ×8 IMPLANT
CATH EXPO 5FR AL1 (CATHETERS) ×4 IMPLANT
CATH S G BIP PACING (SET/KITS/TRAYS/PACK) ×8 IMPLANT
CLIP TI MEDIUM 24 (CLIP) ×4 IMPLANT
CLIP TI WIDE RED SMALL 24 (CLIP) ×4 IMPLANT
CONT SPEC 4OZ CLIKSEAL STRL BL (MISCELLANEOUS) ×8 IMPLANT
COVER BACK TABLE 60X90IN (DRAPES) ×4 IMPLANT
COVER BACK TABLE 80X110 HD (DRAPES) ×4 IMPLANT
COVER DOME SNAP 22 D (MISCELLANEOUS) ×4 IMPLANT
COVER MAYO STAND STRL (DRAPES) ×4 IMPLANT
CRADLE DONUT ADULT HEAD (MISCELLANEOUS) ×4 IMPLANT
DERMABOND ADVANCED (GAUZE/BANDAGES/DRESSINGS) ×2
DERMABOND ADVANCED .7 DNX12 (GAUZE/BANDAGES/DRESSINGS) ×2 IMPLANT
DEVICE CLOSURE PERCLS PRGLD 6F (VASCULAR PRODUCTS) IMPLANT
DRAPE INCISE IOBAN 66X45 STRL (DRAPES) IMPLANT
DRAPE SLUSH MACHINE 52X66 (DRAPES) ×4 IMPLANT
DRSG ADAPTIC 3X8 NADH LF (GAUZE/BANDAGES/DRESSINGS) ×3 IMPLANT
DRSG TEGADERM 4X4.75 (GAUZE/BANDAGES/DRESSINGS) ×4 IMPLANT
ELECT REM PT RETURN 9FT ADLT (ELECTROSURGICAL) ×8
ELECTRODE REM PT RTRN 9FT ADLT (ELECTROSURGICAL) ×4 IMPLANT
FELT TEFLON 6X6 (MISCELLANEOUS) ×4 IMPLANT
FEMORAL VENOUS CANN RAP (CANNULA) IMPLANT
GAUZE SPONGE 2X2 8PLY STRL LF (GAUZE/BANDAGES/DRESSINGS) IMPLANT
GAUZE SPONGE 4X4 12PLY STRL (GAUZE/BANDAGES/DRESSINGS) ×4 IMPLANT
GAUZE SPONGE 4X4 12PLY STRL LF (GAUZE/BANDAGES/DRESSINGS) ×2 IMPLANT
GLOVE BIO SURGEON STRL SZ7.5 (GLOVE) ×4 IMPLANT
GLOVE BIO SURGEON STRL SZ8 (GLOVE) ×8 IMPLANT
GLOVE EUDERMIC 7 POWDERFREE (GLOVE) ×4 IMPLANT
GLOVE ORTHO TXT STRL SZ7.5 (GLOVE) ×4 IMPLANT
GOWN STRL REUS W/ TWL LRG LVL3 (GOWN DISPOSABLE) ×6 IMPLANT
GOWN STRL REUS W/ TWL XL LVL3 (GOWN DISPOSABLE) ×12 IMPLANT
GOWN STRL REUS W/TWL LRG LVL3 (GOWN DISPOSABLE) ×12
GOWN STRL REUS W/TWL XL LVL3 (GOWN DISPOSABLE) ×24
GUIDEWIRE SAF TJ AMPL .035X180 (WIRE) ×4 IMPLANT
GUIDEWIRE SAFE TJ AMPLATZ EXST (WIRE) ×4 IMPLANT
GUIDEWIRE STRAIGHT .035 260CM (WIRE) ×4 IMPLANT
INSERT FOGARTY 61MM (MISCELLANEOUS) ×4 IMPLANT
INSERT FOGARTY SM (MISCELLANEOUS) IMPLANT
INSERT FOGARTY XLG (MISCELLANEOUS) IMPLANT
KIT BASIN OR (CUSTOM PROCEDURE TRAY) ×4 IMPLANT
KIT DILATOR VASC 18G NDL (KITS) IMPLANT
KIT HEART LEFT (KITS) ×4 IMPLANT
KIT ROOM TURNOVER OR (KITS) ×4 IMPLANT
KIT SUCTION CATH 14FR (SUCTIONS) ×8 IMPLANT
NDL PERC 18GX7CM (NEEDLE) ×2 IMPLANT
NEEDLE PERC 18GX7CM (NEEDLE) ×4 IMPLANT
NS IRRIG 1000ML POUR BTL (IV SOLUTION) ×12 IMPLANT
PACK AORTA (CUSTOM PROCEDURE TRAY) ×4 IMPLANT
PAD ARMBOARD 7.5X6 YLW CONV (MISCELLANEOUS) ×8 IMPLANT
PAD ELECT DEFIB RADIOL ZOLL (MISCELLANEOUS) ×4 IMPLANT
PATCH TACHOSII LRG 9.5X4.8 (VASCULAR PRODUCTS) IMPLANT
PERCLOSE PROGLIDE 6F (VASCULAR PRODUCTS)
SET MICROPUNCTURE 5F STIFF (MISCELLANEOUS) ×4 IMPLANT
SHEATH AVANTI 11CM 8FR (MISCELLANEOUS) ×4 IMPLANT
SHEATH PINNACLE 6F 10CM (SHEATH) ×8 IMPLANT
SLEEVE REPOSITIONING LENGTH 30 (MISCELLANEOUS) ×4 IMPLANT
SPONGE GAUZE 2X2 STER 10/PKG (GAUZE/BANDAGES/DRESSINGS) ×2
SPONGE LAP 4X18 X RAY DECT (DISPOSABLE) ×4 IMPLANT
STOPCOCK MORSE 400PSI 3WAY (MISCELLANEOUS) ×24 IMPLANT
SUT ETHIBOND X763 2 0 SH 1 (SUTURE) IMPLANT
SUT GORETEX CV 4 TH 22 36 (SUTURE) IMPLANT
SUT GORETEX CV4 TH-18 (SUTURE) IMPLANT
SUT GORETEX TH-18 36 INCH (SUTURE) IMPLANT
SUT MNCRL AB 3-0 PS2 18 (SUTURE) IMPLANT
SUT PROLENE 3 0 SH1 36 (SUTURE) IMPLANT
SUT PROLENE 4 0 RB 1 (SUTURE)
SUT PROLENE 4-0 RB1 .5 CRCL 36 (SUTURE) IMPLANT
SUT PROLENE 5 0 C 1 36 (SUTURE) IMPLANT
SUT PROLENE 6 0 C 1 30 (SUTURE) IMPLANT
SUT SILK  1 MH (SUTURE) ×2
SUT SILK 1 MH (SUTURE) ×2 IMPLANT
SUT SILK 2 0 SH CR/8 (SUTURE) IMPLANT
SUT VIC AB 2-0 CT1 27 (SUTURE)
SUT VIC AB 2-0 CT1 TAPERPNT 27 (SUTURE) IMPLANT
SUT VIC AB 2-0 CTX 36 (SUTURE) IMPLANT
SUT VIC AB 3-0 SH 8-18 (SUTURE) IMPLANT
SYR 10ML LL (SYRINGE) ×12 IMPLANT
SYR 30ML LL (SYRINGE) ×8 IMPLANT
SYR 50ML LL SCALE MARK (SYRINGE) ×4 IMPLANT
TOWEL OR 17X26 10 PK STRL BLUE (TOWEL DISPOSABLE) ×8 IMPLANT
TRANSDUCER W/STOPCOCK (MISCELLANEOUS) ×8 IMPLANT
TRAY FOLEY SILVER 14FR TEMP (SET/KITS/TRAYS/PACK) ×4 IMPLANT
TUBE SUCT INTRACARD DLP 20F (MISCELLANEOUS) IMPLANT
TUBING HIGH PRESSURE 120CM (CONNECTOR) ×4 IMPLANT
VALVE HEART TRANSCATH SZ3 29MM (Prosthesis & Implant Heart) ×3 IMPLANT
WIRE AMPLATZ SS-J .035X180CM (WIRE) ×4 IMPLANT
WIRE BENTSON .035X145CM (WIRE) ×4 IMPLANT

## 2017-05-09 NOTE — Op Note (Signed)
[facility]   MULTIDISCIPLINARY HEART VALVE TEAM   TAVR OPERATIVE NOTE   Date of Procedure:  05/09/2017  Preoperative Diagnosis: Severe Aortic Stenosis   Postoperative Diagnosis: Same   Procedure:    Transcatheter Aortic Valve Replacement - Percutaneous Left Transfemoral Approach  Edwards Sapien 3 THV (size 29 mm, model # 9600TFX, serial # [id number])   Co-Surgeons:  Salvatore Decent. Cornelius Moras, MD and Tonny Bollman, MD  Anesthesiologist:  Rosezella Florida, MD  Echocardiographer:  Tobias Alexander, MD  Pre-operative Echo Findings:  Severe aortic stenosis  Moderate LV systolic dysfunction  Post-operative Echo Findings:  No paravalvular leak  Improved left ventricular systolic function   BRIEF CLINICAL NOTE AND INDICATIONS FOR SURGERY  Patient is a 76 year old male with history of coronary artery disease status post coronary artery bypass grafting in 2004, aortic stenosis, long standing persistent atrial fibrillation on warfarin anticoagulation, left bundle branch block, hyperlipidemia, peripheral vascular disease, chronic venous insufficiency, instability of gait with mildly reduced mobility, remote history of tobacco use, and degenerative disease of the cervical spine with remote history of injury to the neck who has been referred for surgical consultation to discuss treatment options for management of severe symptomatic aortic stenosis. The patient's cardiac history dates back nearly 15 years ago when the patient presented with angina pectoris. He was found to have single-vessel coronary artery disease and underwent PCI and stenting of the left anterior descending coronary artery. He developed restenosis requiring multiple interventions, and eventually he underwent off-pump coronary artery bypass grafting 2 by Dr. Tyrone Sage in 2004. Grafts placed at the time of surgery included left internal mammary artery to the distal left anterior descending coronary artery and  saphenous vein graft to the diagonal branch. More recently the patient developed persistent atrial fibrillation which failed an attempt at DC cardioversion, and he has been chronically anticoagulated using warfarin for several years. He has known of the presence of a heart murmur for least 10 years, and recently he has been followed by Dr. Diona Browner with serial echocardiograms. Over the past 6-12 months the patient complains of progressive symptoms of exertional shortness of breath, chest tightness, and fatigue. He now gets short of breath with very mild activity and occasionally at rest. He reports episodes of PND and dizzy spells without syncope. He has chronic lower extremity edema.  He was seen in follow-up by Dr. Diona Browner and repeat echocardiogram performed 02/21/2017 revealed severe aortic stenosis. Peak velocity across the aortic valve measured nearly 4.2 m/s corresponding to mean transvalvular gradient estimated 39 mmHg. The DVI was quite low at 0.19.  Left ventricular systolic function was mildly reduced with ejection fraction estimated 45-50%.  The patient was referred to the multidisciplinary heart valve clinic and underwent left and right heart catheterization by Dr. Excell Seltzer on 03/29/2017. Catheterization confirmed the presence of severe aortic stenosis with mean transvalvular gradient measured 43 mmHg by catheterization and aortic valve area calculated 0.92 cm.  The patient was noted to have severe multivessel coronary artery disease with 100% chronic occlusion of the left anterior descending coronary artery but continued patency of the left internal mammary artery and saphenous vein grafts. There was moderate disease involving a small nondominant right coronary artery.  Pulmonary artery pressures were moderately elevated. The patient subsequently underwent CT angiography and was referred for surgical consultation  During the course of the patient's preoperative work up they have been evaluated  comprehensively by a multidisciplinary team of specialists coordinated through the Multidisciplinary Heart Valve Clinic in the [facility].  They have been demonstrated to suffer from symptomatic severe aortic stenosis as noted above. The patient has been counseled extensively as to the relative risks and benefits of all options for the treatment of severe aortic stenosis including long term medical therapy, conventional surgery for aortic valve replacement, and transcatheter aortic valve replacement.  All questions have been answered, and the patient provides full informed consent for the operation as described.   DETAILS OF THE OPERATIVE PROCEDURE  PREPARATION:    The patient is brought to the operating room on the above mentioned date and central monitoring was established by the anesthesia team including placement of a central venous line and radial arterial line. The patient is placed in the supine position on the operating table.  Intravenous antibiotics are administered. The patient is monitored closely throughout the procedure under conscious sedation.  Baseline transthoracic echocardiogram was performed. The patient's chest, abdomen, both groins, and both lower extremities are prepared and draped in a sterile manner. A time out procedure is performed.   PERIPHERAL ACCESS:    Using the modified Seldinger technique, femoral arterial and venous access was obtained with placement of 6 Fr sheaths on the right side.  A pigtail diagnostic catheter was passed through the right arterial sheath under fluoroscopic guidance into the aortic root.  A temporary transvenous pacemaker catheter was passed through the right femoral venous sheath under fluoroscopic guidance into the right ventricle.  The pacemaker was tested to ensure stable lead placement and pacemaker capture. Aortic root angiography was performed in order to determine the optimal angiographic angle for valve  deployment.   TRANSFEMORAL ACCESS:   Percutaneous transfemoral access and sheath placement was performed by Dr Excell Seltzer. Please see his separate operative note for details. The patient was heparinized systemically and ACT verified > 250 seconds.    A 16 Fr transfemoral E-sheath was introduced into the left femoral artery after progressively dilating over an Amplatz superstiff wire. An AL-2 catheter was used to direct a straight-tip exchange length wire across the native aortic valve into the left ventricle. This was exchanged out for a pigtail catheter and position was confirmed in the LV apex. Simultaneous LV and Ao pressures were recorded.  The pigtail catheter was exchanged for an Amplatz Extra-stiff wire in the LV apex.  Echocardiography was utilized to confirm appropriate wire position and no sign of entanglement in the mitral subvalvular apparatus.   TRANSCATHETER HEART VALVE DEPLOYMENT:   An Edwards Sapien 3 transcatheter heart valve (size 29 mm, model #9600TFX, serial #[id number]) was prepared and crimped per manufacturer's guidelines, and the proper orientation of the valve is confirmed on the [employer] delivery system. The valve was advanced through the introducer sheath using normal technique until in an appropriate position in the abdominal aorta beyond the sheath tip. The balloon was then retracted and using the fine-tuning wheel was centered on the valve. The valve was then advanced across the aortic arch using appropriate flexion of the catheter. The valve was carefully positioned across the aortic valve annulus. The Commander catheter was retracted using normal technique. Once final position of the valve has been confirmed by angiographic assessment, the valve is deployed while temporarily holding ventilation and during rapid ventricular pacing to maintain systolic blood pressure < 50 mmHg and pulse pressure < 10 mmHg. The balloon inflation is held for >3 seconds after reaching full  deployment volume. Once the balloon has fully deflated the balloon is retracted into the ascending aorta and  valve function is assessed using echocardiography. There is felt to be no paravalvular leak and no central aortic insufficiency.  The patient's hemodynamic recovery following valve deployment is good.  The deployment balloon and guidewire are both removed. Final intraoperative echocardiogram demostrated acceptable post-procedural gradients, stable mitral valve function, no aortic insufficiency, and stable LV systolic function.   PROCEDURE COMPLETION:   The sheath was removed and femoral artery closure performed by Dr Excell Seltzerooper. Please see his separate report for details.  Protamine was administered once femoral arterial repair was complete. The temporary pacemaker, pigtail catheters and femoral sheaths were removed with manual pressure used for hemostasis.   The patient tolerated the procedure well and is transported to the surgical intensive care in stable condition. There were no immediate intraoperative complications. All sponge instrument and needle counts are verified correct at completion of the operation.   No blood products were administered during the operation.  The patient received a total of 38.4 mL of intravenous contrast during the procedure.   Purcell Nailslarence H Owen, MD 05/09/2017 9:33 AM

## 2017-05-09 NOTE — Anesthesia Procedure Notes (Signed)
Central Venous Catheter Insertion Performed by: Arta BruceSSEY, Eyoel Throgmorton, anesthesiologist Start/End6/26/2018 7:00 AM, 05/09/2017 7:15 AM Patient location: Pre-op. Preanesthetic checklist: patient identified, IV checked, risks and benefits discussed, surgical consent, monitors and equipment checked, pre-op evaluation, timeout performed and anesthesia consent Position: Trendelenburg Lidocaine 1% used for infiltration and patient sedated Hand hygiene performed  and maximum sterile barriers used  Catheter size: 8 Fr Total catheter length 16. Central line was placed.Double lumen Procedure performed using ultrasound guided technique. Ultrasound Notes:image(s) printed for medical record Attempts: 1 Following insertion, dressing applied and line sutured. Post procedure assessment: blood return through all ports  Patient tolerated the procedure well with no immediate complications.

## 2017-05-09 NOTE — Progress Notes (Signed)
Patient ID: Clifford ShamWilliam T King, male   DOB: [birth year], 76 y.o.   MRN: [id number]  SICU Evening Rounds:   Hemodynamically stable  Awake and alert.    Urine output good   Groin sites dry  CBC    Component Value Date/Time   WBC 2.9 (L) 05/09/2017 1014   RBC 4.28 05/09/2017 1014   HGB 12.6 (L) 05/09/2017 1017   HCT 37.0 (L) 05/09/2017 1017   PLT 76 (L) 05/09/2017 1014   MCV 89.7 05/09/2017 1014   MCH 28.7 05/09/2017 1014   MCHC 32.0 05/09/2017 1014   RDW 14.1 05/09/2017 1014     BMET    Component Value Date/Time   NA 142 05/09/2017 1017   K 4.1 05/09/2017 1017   CL 101 05/09/2017 0906   CO2 23 05/02/2017 1358   GLUCOSE 148 (H) 05/09/2017 1017   BUN 16 05/09/2017 0906   CREATININE 0.80 05/09/2017 0906   CALCIUM 8.9 05/02/2017 1358   GFRNONAA >60 05/02/2017 1358   GFRAA >60 05/02/2017 1358     A/P:  Stable postop course. Continue current plans

## 2017-05-09 NOTE — Interval H&P Note (Signed)
History and Physical Interval Note:  05/09/2017 6:34 AM  Clifford ShamWilliam T Faughn  has presented today for surgery, with the diagnosis of SEVERE AS  The various methods of treatment have been discussed with the patient and family. After consideration of risks, benefits and other options for treatment, the patient has consented to  Procedure(s): TRANSCATHETER AORTIC VALVE REPLACEMENT, TRANSFEMORAL (N/A) TRANSESOPHAGEAL ECHOCARDIOGRAM (TEE) (N/A) as a surgical intervention .  The patient's history has been reviewed, patient examined, no change in status, stable for surgery.  I have reviewed the patient's chart and labs.  Questions were answered to the patient's satisfaction.     Purcell Nailslarence H Owen

## 2017-05-09 NOTE — Op Note (Signed)
  [facility]   MULTIDISCIPLINARY HEART VALVE TEAM   TAVR OPERATIVE NOTE   Date of Procedure:  05/09/2017  Preoperative Diagnosis: Severe Aortic Stenosis   Postoperative Diagnosis: Same   Procedure:   Transcatheter Aortic Valve Replacement - Percutaneous  Transfemoral Approach  Edwards Sapien 3 THV (size 29 mm, model # 9600TFX, serial # [id number])   Co-Surgeons:  Salvatore Decentlarence H. Cornelius Moraswen, MD and Tonny BollmanMichael Clare Fennimore, MD  Anesthesiologist:  Rosezella FloridaWilliam E Fitzgerald, MD  Echocardiographer:  Tobias AlexanderKatarina Nelson, MD  Pre-operative Echo Findings:  Severe aortic stenosis  Moderately severe left ventricular systolic dysfunction  Post-operative Echo Findings:  No paravalvular leak  Improved left ventricular systolic function  Please see the complete note of Dr Cornelius Moraswen for full operative details.   PERIPHERAL ACCESS:    Using ultrasound guidance, femoral arterial and venous access was obtained with placement of 6 Fr sheaths on the right side.  A pigtail diagnostic catheter was passed through the right femoral arterial sheath under fluoroscopic guidance into the aortic root.  A temporary transvenous pacemaker catheter was passed through the right femoral venous sheath under fluoroscopic guidance into the right ventricle.  The pacemaker was tested to ensure stable lead placement and pacemaker capture. Aortic root angiography was performed in order to determine the optimal angiographic angle for valve deployment.   TRANSFEMORAL ACCESS:  A micropuncture technique is used to access the left femoral artery under fluoroscopic and ultrasound guidance.  2 Perclose devices are deployed at 10' and 2' positions to 'PreClose' the femoral artery. An 8 French sheath is placed and then an Amplatz Superstiff wire is advanced through the sheath. This is changed out for a 16 French transfemoral E-Sheath after progressively dilating over the Superstiff wire.   PROCEDURE COMPLETION:  The sheath was removed and  femoral artery closure is performed using the 2 previously deployed Perclose devices.  After tightening the sutures there is complete hemostasis. The temporary pacemaker, pigtail catheters and femoral sheaths were removed with manual pressure used for hemostasis on the right side.   The patient tolerated the procedure well and is transported to the surgical intensive care in stable condition. There were no immediate intraoperative complications. All sponge instrument and needle counts are verified correct at completion of the operation.   The patient received a total of 40 mL of intravenous contrast during the procedure.   Tonny Bollmanooper, Nikitas Davtyan, MD 05/09/2017 1:26 PM

## 2017-05-09 NOTE — Anesthesia Procedure Notes (Signed)
Procedure Name: MAC Date/Time: 05/09/2017 7:29 AM Performed by: Mervyn Gay Pre-anesthesia Checklist: Patient identified, Patient being monitored, Timeout performed, Emergency Drugs available and Suction available Patient Re-evaluated:Patient Re-evaluated prior to inductionOxygen Delivery Method: Simple face mask Number of attempts: 1 Placement Confirmation: positive ETCO2 Dental Injury: Teeth and Oropharynx as per pre-operative assessment

## 2017-05-09 NOTE — H&P (View-Only) (Signed)
[facility]  MULTIDISCIPLINARY HEART VALVE CLINIC  CARDIOTHORACIC SURGERY CONSULTATION REPORT  Referring Provider is Jonelle Sidle, MD PCP is Chana Bode, DO  Chief Complaint  Patient presents with  . Aortic Stenosis    severe..eval for TAVR VS OPEN...all studies are complete except for exercise due to left heel spurs    HPI:  Patient is a 76 year old male with history of coronary artery disease status post coronary artery bypass grafting in 2004, aortic stenosis, long standing persistent atrial fibrillation on warfarin anticoagulation, left bundle branch block, hyperlipidemia, peripheral vascular disease, chronic venous insufficiency, instability of gait with mildly reduced mobility, remote history of tobacco use, and degenerative disease of the cervical spine with remote history of injury to the neck who has been referred for surgical consultation to discuss treatment options for management of severe symptomatic aortic stenosis. The patient's cardiac history dates back nearly 15 years ago when the patient presented with angina pectoris. He was found to have single-vessel coronary artery disease and underwent PCI and stenting of the left anterior descending coronary artery. He developed restenosis requiring multiple interventions, and eventually he underwent off-pump coronary artery bypass grafting 2 by Dr. Tyrone Sage in 2004. Grafts placed at the time of surgery included left internal mammary artery to the distal left anterior descending coronary artery and saphenous vein graft to the diagonal branch. More recently the patient developed persistent atrial fibrillation which failed an attempt at DC cardioversion, and he has been chronically anticoagulated using warfarin for several years. He has known of the presence of a heart murmur for least 10 years, and recently he has been followed by Dr. Diona Browner with serial echocardiograms. Over the past 6-12 months the patient  complains of progressive symptoms of exertional shortness of breath, chest tightness, and fatigue. He now gets short of breath with very mild activity and occasionally at rest. He reports episodes of PND and dizzy spells without syncope. He has chronic lower extremity edema.  He was seen in follow-up by Dr. Diona Browner and repeat echocardiogram performed 02/21/2017 revealed severe aortic stenosis. Peak velocity across the aortic valve measured nearly 4.2 m/s corresponding to mean transvalvular gradient estimated 39 mmHg. The DVI was quite low at 0.19.  Left ventricular systolic function was mildly reduced with ejection fraction estimated 45-50%.  The patient was referred to the multidisciplinary heart valve clinic and underwent left and right heart catheterization by Dr. Excell Seltzer on 03/29/2017. Catheterization confirmed the presence of severe aortic stenosis with mean transvalvular gradient measured 43 mmHg by catheterization and aortic valve area calculated 0.92 cm.  The patient was noted to have severe multivessel coronary artery disease with 100% chronic occlusion of the left anterior descending coronary artery but continued patency of the left internal mammary artery and saphenous vein grafts. There was moderate disease involving a small nondominant right coronary artery.  Pulmonary artery pressures were moderately elevated. The patient subsequently underwent CT angiography and was referred for surgical consultation. Of note, CT angiogram of the chest, abdomen and pelvis was reported to be concerning for the possibility of a small clot in the distal portion of left atrial appendage as well as a small benign-appearing low-attenuation lesion in the pancreas. However, cardiac gated CT angiogram of the heart was not suggestive of left atrial thrombus. Since that time the patient's prothrombin time has been checked and he remains therapeutic on warfarin.  The patient is married and lives with his wife in [place]  [place]. 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  He has remained reasonably active physically in retirement although he complains that recently he has been severely limited by progressive exertional shortness of breath, chest tightness, and fatigue. He also is limited by instability of gait with chronic pain in his left foot related to heel spurs. He has some poor balance and difficulty walking. In 2011 he suffered a fall and broke one of the vertebra in his neck, and he now has limited mobility of the cervical spine.  Past Medical History:  Diagnosis Date  . Atrial fibrillation (HCC)   . Carotid artery disease (HCC)    Nonobstructive  . Coronary atherosclerosis of native coronary artery    Multivessel, LVEF 60%, occluded small nondominant RCA  (not bypassed)  . Essential hypertension, benign   . LBBB (left bundle branch block)   . Longstanding persistent atrial fibrillation (HCC)        . Mixed hyperlipidemia   . Morbid obesity (HCC)   . Pancreatic mass 04/06/2017   low attenuation lesion - needs f/u imaging 2 years  . 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 Severe aortic stenosis     Past Surgical History:  Procedure Laterality Date  . CORONARY ARTERY BYPASS GRAFT  02/17/2003   Off-pump LIMA to LAD, SVG to diagonal - Dr Tyrone Sage  . RIGHT/LEFT HEART CATH AND CORONARY ANGIOGRAPHY N/A 03/29/2017   Procedure: Right/Left Heart Cath and Coronary Angiography;  Surgeon: Tonny Bollman, MD;  Location: [facility] INVASIVE CV LAB;  Service: Cardiovascular;  Laterality: N/A;    Family History  Problem Relation Age of Onset  . Stroke Other        family h/o  . Cancer Other        family h/o    Social History   Social History  . Marital status: Married    Spouse name: N/A  . Number of children: N/A  . Years of education: N/A   Occupational History  . Retired     Disabled  .  Retired   Social History Main Topics  . Smoking status: Former  Smoker    Packs/day: 0.50    Years: 10.00    Types: Cigars    Start date: 11/14/1976    Quit date: 11/14/1986  . Smokeless tobacco: Never Used  . Alcohol use No  . Drug use: No  . Sexual activity: Not on file   Other Topics Concern  . Not on file   Social History Narrative  . No narrative on file    Current Outpatient Prescriptions  Medication Sig Dispense Refill  . allopurinol (ZYLOPRIM) 100 MG tablet Take 100 mg by mouth daily.      Marland Kitchen amLODipine (NORVASC) 10 MG tablet TAKE 1 TABLET BY MOUTH DAILY 90 tablet 3  . aspirin 81 MG tablet Take 81 mg by mouth daily.      Marland Kitchen lisinopril (PRINIVIL,ZESTRIL) 20 MG tablet Take 20 mg by mouth daily.      . metoprolol succinate (TOPROL-XL) 50 MG 24 hr tablet Take 1 & 1/2 tablets daily 135 tablet 1  . Nystatin (NYAMYC) 100000 UNIT/GM POWD Apply topically daily as needed (infections).     . simvastatin (ZOCOR) 10 MG tablet TAKE 1 TABLET BY MOUTH AT BEDTIME 90 tablet 3  . warfarin (COUMADIN) 5 MG tablet TAKE AS DIRECTED BY COUMADIN CLINIC (Patient taking differently: Take 5-10 mg by mouth See admin instructions. Takes 5 mg (1 tab) on  Monday and Friday, all other days (sun, tues, wed, thurs and Saturday) takes 2 (5mg ) tablets =10mg ) 180 tablet 0  . furosemide (LASIX) 40 MG tablet Take 1 tablet (40 mg total) by mouth daily. (Patient not taking: Reported on 04/06/2017) 30 tablet 1  . potassium chloride (K-DUR) 10 MEQ tablet Take 1 tablet (10 mEq total) by mouth daily. (Patient not taking: Reported on 04/12/2017) 30 tablet 1   No current facility-administered medications for this visit.     Allergies  Allergen Reactions  . Vicodin [Hydrocodone-Acetaminophen] Other (See Comments)    hallucinations      Review of Systems:   General:  normal appetite, decreased energy, no weight gain, no weight loss, no fever  Cardiac:  + chest pain with exertion, no chest pain at rest, + SOB with exertion, + occasional resting SOB, + PND, no orthopnea, no  palpitations, + arrhythmia, + atrial fibrillation, + LE edema, + dizzy spells, no syncope  Respiratory:  + shortness of breath, no home oxygen, no productive cough, no dry cough, no bronchitis, no wheezing, no hemoptysis, no asthma, no pain with inspiration or cough, no sleep apnea, no CPAP at night  GI:   no difficulty swallowing, no reflux, no frequent heartburn, no hiatal hernia, no abdominal pain, no constipation, no diarrhea, no hematochezia, no hematemesis, no melena  GU:   no dysuria,  no frequency, no urinary tract infection, no hematuria, no enlarged prostate, no kidney stones, no kidney disease  Vascular:  no pain suggestive of claudication, + pain in feet, no leg cramps, + varicose veins, + DVT, no non-healing foot ulcer  Neuro:   no stroke, no TIA's, no seizures, no headaches, no temporary blindness one eye,  no slurred speech, no peripheral neuropathy, + mild chronic pain, + mild instability of gait, + mild memory/cognitive dysfunction  Musculoskeletal: + arthritis, no joint swelling, no myalgias, + mild difficulty walking, decreased mobility   Skin:   no rash, no itching, no skin infections, no pressure sores or ulcerations  Psych:   no anxiety, no depression, no nervousness, no unusual recent stress  Eyes:   + blurry vision, no floaters, no recent vision changes, + wears glasses or contacts  ENT:   no hearing loss, no loose or painful teeth, no dentures, last saw dentist 2 years ago  Hematologic:  + easy bruising, no abnormal bleeding, no clotting disorder, no frequent epistaxis  Endocrine:  no diabetes, does not check CBG's at home           Physical Exam:   BP 125/68 (BP Location: Right Arm, Patient Position: Sitting, Cuff Size: Large)   Pulse 67   Resp 16   Ht 6\' 5"  (1.956 m)   Wt 264 lb 3.2 oz (119.8 kg)   SpO2 95% Comment: ON RA  BMI 31.33 kg/m   General:  Moderately obese,  well-appearing  HEENT:  Unremarkable   Neck:   no JVD, no bruits, no adenopathy    Chest:   clear to auscultation, symmetrical breath sounds, no wheezes, no rhonchi   CV:   RRR, grade III/VI crescendo/decrescendo murmur heard best at RSB,  no diastolic murmur  Abdomen:  soft, non-tender, no masses   Extremities:  warm, well-perfused, pulses diminished but palpable, + LE edema, + severe varicose veins RLE  Rectal/GU  Deferred  Neuro:   Grossly non-focal and symmetrical throughout  Skin:   Clean and dry, + chronic venous stasis both lower legs R>>L but no breakdown  Diagnostic Tests:  Transthoracic Echocardiography  Patient:    Calob, Baskette MR #:       [id number] Study Date: 02/21/2017 Gender:     M Age:        64 Height:     195.6 cm Weight:     125.6 kg BSA:        2.64 m^2 Pt. Status: Room:   ATTENDING    Nona Dell, M.D.  ORDERING     Nona Dell, M.D.  REFERRING    Nona Dell, M.D.  SONOGRAPHER  [facility]  PERFORMING   [facility]  cc:  ------------------------------------------------------------------- LV EF: 45% -   50%  ------------------------------------------------------------------- Indications:      CAD of native vessels 414.01.  ------------------------------------------------------------------- History:   PMH:  Former Smoker.  Atrial fibrillation.  Coronary artery disease.  Risk factors:  Hypertension. Dyslipidemia.  ------------------------------------------------------------------- Study Conclusions  - Left ventricle: The cavity size was normal. There was mild   concentric hypertrophy. Systolic function was low normal to   mildly reduced. The estimated ejection fraction was in the range   of 45% to 50%. Diffuse hypokinesis. The study was not technically   sufficient to allow evaluation of LV diastolic dysfunction due to   atrial fibrillation. - Aortic valve: Peak velocity (S): 415 cm/s. Mean gradient (S): 41   mm Hg. Valve area (VTI): 0.6 cm^2. Valve area (Vmax): 0.59 cm^2.   Valve area  (Vmean): 0.59 cm^2. - Mitral valve: Mildly calcified annulus. There was mild   regurgitation. - Left atrium: The atrium was severely dilated. - Right ventricle: Systolic function was mildly reduced. - Right atrium: The atrium was mildly dilated. - Pulmonary arteries: PA peak pressure: 40 mm Hg (S).  ------------------------------------------------------------------- Study data:  No prior study was available for comparison.  Study status:  Routine.  Procedure:  Transthoracic echocardiography. Image quality was adequate.          Transthoracic echocardiography.  M-mode, complete 2D, spectral Doppler, and color Doppler.  Birthdate:  Patient birthdate: 1940-12-07.  Age:  Patient is 76 yr old.  Sex:  Gender: male.    BMI: 32.8 kg/m^2.  Patient status:  Outpatient.  Study date:  Study date: 02/21/2017. Study time: 09:54 AM.  Location:  Echo laboratory.  -------------------------------------------------------------------  ------------------------------------------------------------------- Left ventricle:  The cavity size was normal. There was mild concentric hypertrophy. Systolic function was low normal to mildly reduced. The estimated ejection fraction was in the range of 45% to 50%. Diffuse hypokinesis. The study was not technically sufficient to allow evaluation of LV diastolic dysfunction due to atrial fibrillation. There was no evidence of elevated ventricular filling pressure by Doppler parameters.  ------------------------------------------------------------------- Aortic valve:   Severely calcified leaflets.  Doppler:  There was no regurgitation.    VTI ratio of LVOT to aortic valve: 0.19. Valve area (VTI): 0.6 cm^2. Indexed valve area (VTI): 0.23 cm^2/m^2. Peak velocity ratio of LVOT to aortic valve: 0.19. Valve area (Vmax): 0.59 cm^2. Indexed valve area (Vmax): 0.22 cm^2/m^2. Mean velocity ratio of LVOT to aortic valve: 0.19. Valve area (Vmean): 0.59 cm^2. Indexed valve  area (Vmean): 0.22 cm^2/m^2.    Mean gradient (S): 41 mm Hg. Peak gradient (S): 69 mm Hg.  ------------------------------------------------------------------- Aorta:  Aortic root: The aortic root was normal in size.  ------------------------------------------------------------------- Mitral valve:   Mildly calcified annulus. Leaflet separation was normal.  Doppler:  Transvalvular velocity was within the normal range. There was no evidence for stenosis. There was mild regurgitation.  Peak gradient (D): 5 mm Hg.  ------------------------------------------------------------------- Left atrium:  The atrium was severely dilated.  ------------------------------------------------------------------- Right ventricle:  The cavity size was normal. Systolic function was mildly reduced.  ------------------------------------------------------------------- Pulmonic valve:   Poorly visualized.  Doppler:  There was no significant regurgitation.  ------------------------------------------------------------------- Tricuspid valve:   The valve appears to be grossly normal. Doppler:  There was trivial regurgitation.  ------------------------------------------------------------------- Right atrium:  The atrium was mildly dilated.  ------------------------------------------------------------------- Pericardium:  Not well visualized.  ------------------------------------------------------------------- Systemic veins: Inferior vena cava: The vessel was dilated. The respirophasic diameter changes were blunted (< 50%), consistent with elevated central venous pressure.  ------------------------------------------------------------------- Measurements   Left ventricle                           Value          Reference  LV ID, ED, PLAX chordal          (H)     56.7  mm       43 - 52  LV ID, ES, PLAX chordal          (H)     45.6  mm       23 - 38  LV fx shortening, PLAX chordal   (L)      20    %        >=29  LV PW thickness, ED                      13.4  mm       ----------  IVS/LV PW ratio, ED                      0.86           <=1.3  Stroke volume, 2D                        70    ml       ----------  Stroke volume/bsa, 2D                    26    ml/m^2   ----------  LV e&', lateral                           8.38  cm/s     ----------  LV E/e&', lateral                         13.37          ----------  LV e&', medial                            7.4   cm/s     ----------  LV E/e&', medial                          15.14          ----------  LV e&', average                           7.89  cm/s     ----------  LV E/e&', average  14.2           ----------    Ventricular septum                       Value          Reference  IVS thickness, ED                        11.5  mm       ----------    LVOT                                     Value          Reference  LVOT ID, S                               20    mm       ----------  LVOT area                                3.14  cm^2     ----------  LVOT peak velocity, S                    78.1  cm/s     ----------  LVOT mean velocity, S                    54.5  cm/s     ----------  LVOT VTI, S                              22.3  cm       ----------  LVOT peak gradient, S                    2     mm Hg    ----------    Aortic valve                             Value          Reference  Aortic valve peak velocity, S            415   cm/s     ----------  Aortic valve mean velocity, S            290   cm/s     ----------  Aortic valve VTI, S                      117   cm       ----------  Aortic mean gradient, S                  39    mm Hg    ----------  Aortic peak gradient, S                  69    mm Hg    ----------  VTI ratio, LVOT/AV                       0.19           ----------  Aortic valve area, VTI                   0.6   cm^2     ----------  Aortic valve area/bsa, VTI               0.23  cm^2/m^2  ----------  Velocity ratio, peak, LVOT/AV            0.19           ----------  Aortic valve area, peak velocity         0.59  cm^2     ----------  Aortic valve area/bsa, peak              0.22  cm^2/m^2 ----------  velocity  Velocity ratio, mean, LVOT/AV            0.19           ----------  Aortic valve area, mean velocity         0.59  cm^2     ----------  Aortic valve area/bsa, mean              0.22  cm^2/m^2 ----------  velocity    Aorta                                    Value          Reference  Aortic root ID, ED                       36    mm       ----------    Left atrium                              Value          Reference  LA ID, A-P, ES                           62    mm       ----------  LA ID/bsa, A-P                   (H)     2.35  cm/m^2   <=2.2  LA volume, S                             173   ml       ----------  LA volume/bsa, S                         65.4  ml/m^2   ----------  LA volume, ES, 1-p A4C                   158   ml       ----------  LA volume/bsa, ES, 1-p A4C               59.8  ml/m^2   ----------  LA volume, ES, 1-p A2C                   171   ml       ----------  LA volume/bsa, ES, 1-p A2C  64.7  ml/m^2   ----------    Mitral valve                             Value          Reference  Mitral E-wave peak velocity              112   cm/s     ----------  Mitral A-wave peak velocity              49    cm/s     ----------  Mitral deceleration time         (H)     261   ms       150 - 230  Mitral peak gradient, D                  5     mm Hg    ----------  Mitral E/A ratio, peak                   2.3            ----------    Pulmonary arteries                       Value          Reference  PA pressure, S, DP               (H)     40    mm Hg    <=30    Tricuspid valve                          Value          Reference  Tricuspid regurg peak velocity           250   cm/s     ----------  Tricuspid peak RV-RA gradient            25    mm Hg     ----------    Right atrium                             Value          Reference  RA ID, S-I, ES, A4C              (H)     63.9  mm       34 - 49  RA area, ES, A4C                 (H)     34.7  cm^2     8.3 - 19.5  RA volume, ES, A/L                       152   ml       ----------  RA volume/bsa, ES, A/L                   57.5  ml/m^2   ----------    Systemic veins                           Value          Reference  Estimated CVP  3     mm Hg    ----------    Right ventricle                          Value          Reference  TAPSE                                    18.9  mm       ----------  RV pressure, S, DP                       28    mm Hg    <=30  RV s&', lateral, S                        7.34  cm/s     ----------  Legend: (L)  and  (H)  mark values outside specified reference range.  ------------------------------------------------------------------- Prepared and Electronically Authenticated by  Prentice Docker, MD 2018-04-10T12:48:10   Right/Left Heart Cath and Coronary Angiography  Conclusion     There is severe aortic valve stenosis.   1. Severe aortic stenosis with a mean transvalvular gradient of 43 mmHg and calculated AVA 0.92 square cm 2. 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 S/P CABG with continued patency of the SVG-diagonal and LIMA-LAD 4. Elevated intracardiac pressures consistent with chronic combined systolic and diastolic heart failure  Case discussed with Dr Diona Browner. Will move forward with further evaluation for aortic valve replacement/TAVR for treatment of severe symptomatic aortic stenosis   Indications   Severe aortic stenosis [I35.0 (ICD-10-CM)]  Procedural Details/Technique   Technical Details INDICATION: CHF, aortic stenosis, CAD s/p CABG, progressive dyspnea and fatigue, severe AS by echo (mean gradient 43 mmHg) with reduced LV function LVEF 45-50% diffuse  hypokinesis  PROCEDURAL DETAILS: The right heart cath was performed from the groin. Using the modified seldinger technique, the RFV was accessed with a front wall puncture and a 7 Fr sheath is placed. A swan-ganz catheter is used to record right heart pressures and draw oxygen saturations. The left wrist was then prepped, draped, and anesthetized with 1% lidocaine. Using the modified Seldinger technique a 5/6 French Slender sheath was placed in the left radial artery. Intra-arterial verapamil was administered through the radial artery sheath. IV heparin was administered after a JR4 catheter was advanced into the central aorta. Standard Judkins catheters were used for selective coronary angiography and bypass graft angiography. The aortic valve is crossed with a JR4 catheter and a straight wire. LV pressure is recorded and a pullback across the aortic valve is performed. There were no immediate procedural complications. The patient was transferred to the post catheterization recovery area for further monitoring.     Estimated blood loss <50 mL.  During this procedure the patient was administered the following to achieve and maintain moderate conscious sedation: Versed 1 mg, Fentanyl 25 mcg, while the patient's heart rate, blood pressure, and oxygen saturation were continuously monitored. The period of conscious sedation was 67 minutes, of which I was present face-to-face 100% of this time.    Coronary Findings   Dominance: Left  Left Anterior Descending  Prox LAD lesion, 100% stenosed.  Left Circumflex  There is mild the vessel.  First Obtuse Marginal Branch  There is mild disease  in the vessel.  Second Obtuse Marginal Branch  There is mild disease in the vessel.  Right Coronary Artery  Mid RCA lesion, 40% stenosed.  Graft Angiography  saphenous Graft to 1st Diag  SVG graft was visualized by angiography and is normal in caliber. The graft exhibits mild . Patent SVG-diagonal with mild  irregularity noted  Free LIMA Graft to Dist LAD  LIMA graft was visualized by angiography and is large and anatomically normal. widely patent LIMA-LAD graft  Right Heart   Right Heart Pressures Elevated LV EDP consistent with volume overload.    Left Heart   Aortic Valve There is severe aortic valve stenosis. The aortic valve is calcified. There is restricted aortic valve motion.    Coronary Diagrams   Diagnostic Diagram       Implants     No implant documentation for this case.  PACS Images   Show images for Cardiac catheterization   Link to Procedure Log   Procedure Log    Hemo Data    Most Recent Value  Fick Cardiac Output 6.55 L/min  Fick Cardiac Output Index 2.57 (L/min)/BSA  Aortic Mean Gradient 42.6 mmHg  Aortic Peak Gradient 45 mmHg  Aortic Valve Area 0.92  Aortic Value Area Index 0.36 cm2/BSA  RA A Wave 14 mmHg  RA V Wave 16 mmHg  RA Mean 15 mmHg  RV Systolic Pressure 54 mmHg  RV Diastolic Pressure 9 mmHg  RV EDP 15 mmHg  PA Systolic Pressure 59 mmHg  PA Diastolic Pressure 23 mmHg  PA Mean 40 mmHg  PW A Wave 34 mmHg  PW V Wave 35 mmHg  PW Mean 30 mmHg  AO Systolic Pressure 124 mmHg  AO Diastolic Pressure 55 mmHg  AO Mean 81 mmHg  LV Systolic Pressure 179 mmHg  LV Diastolic Pressure 14 mmHg  LV EDP 25 mmHg  Arterial Occlusion Pressure Extended Systolic Pressure 133 mmHg  Arterial Occlusion Pressure Extended Diastolic Pressure 54 mmHg  Arterial Occlusion Pressure Extended Mean Pressure 81 mmHg  Left Ventricular Apex Extended Systolic Pressure 178 mmHg  Left Ventricular Apex Extended Diastolic Pressure 15 mmHg  Left Ventricular Apex Extended EDP Pressure 22 mmHg  QP/QS 1  TPVR Index 13.24 HRUI  TSVR Index 31.54 HRUI  PVR SVR Ratio 0.06  TPVR/TSVR Ratio 0.42     Cardiac TAVR CT  TECHNIQUE: The patient was scanned on a Philips 256 scanner. A 120 kV retrospective scan was triggered in the descending thoracic aorta at 111 HU's. Gantry  rotation speed was 270 msecs and collimation was .9 mm. 5 mg of iv Metoprolol and no nitro were given. The 3D data set was reconstructed in 5% intervals of the R-R cycle. Systolic and diastolic phases were analyzed on a dedicated work station using MPR, MIP and VRT modes. The patient received 80 cc of contrast.  FINDINGS: Aortic Valve: Trileaflet, severely thickened and calcified with severely restricted leaflet opening, minimal calcifications extending into the LVOT.  Aorta: Normal size, no dissection. Mild calcifications in the ascending and descending aorta, severe circumferential calcifications in the aortic arch.  Sinotubular Junction:  31 x 31 mm  Ascending Thoracic Aorta:  33 x 30 mm  Aortic Arch:  31 x 27 mm  Descending Thoracic Aorta:  28 x 25 mm  Sinus of Valsalva Measurements:  Non-coronary:  32 mm  Right -coronary:  33 mm  Left -coronary:  35 mm  Coronary Artery Height above Annulus:  Left Main:  16 mm  Right Coronary:  16 mm  Virtual Basal Annulus Measurements:  Maximum/Minimum Diameter:  31 x 23 mm  Perimeter:  101 mm  Area:  551 mm2  Optimum Fluoroscopic Angle for Delivery:  LAO 2 CAU 3  IMPRESSION: The study quality is affected by motion artifact.  1. Trileaflet, severely thickened and calcified aortic valve with severely restricted leaflet opening and minimal calcifications extending into the LVOT. Annular measurements suitable for delivery of a 29 mm Edwards-SAPIEN 3 valve.  2.  Sufficient annulus to coronary distance.  3.  Optimum Fluoroscopic Angle for Delivery:  LAO 2 CAU 3  4.  No thrombus in the left atrial appendage.  Tobias Alexander   Electronically Signed   By: Tobias Alexander   On: 04/07/2017 19:50   CT ANGIOGRAPHY CHEST, ABDOMEN AND PELVIS  TECHNIQUE: Multidetector CT imaging through the chest, abdomen and pelvis was performed using the standard protocol during bolus administration  of intravenous contrast. Multiplanar reconstructed images and MIPs were obtained and reviewed to evaluate the vascular anatomy.  CONTRAST:  80 mL of Isovue 370.  COMPARISON:  None.  FINDINGS: CTA CHEST FINDINGS  Cardiovascular: Heart size is mildly enlarged with left atrial dilatation. Filling defect in the tip of the left atrial appendage (axial image 97 of series 5), highly concerning for left atrial appendage thrombus. There is no significant pericardial fluid, thickening or pericardial calcification. There is aortic atherosclerosis, as well as atherosclerosis of the great vessels of the mediastinum and the coronary arteries, including calcified atherosclerotic plaque in the left main, left anterior descending, left circumflex and right coronary arteries. Patient is status post median sternotomy for CABG including LIMA to the LAD.  Mediastinum/Lymph Nodes: Mildly enlarged low right paratracheal lymph node measuring 12 mm in short axis. No other mediastinal are hilar lymphadenopathy. Esophagus is unremarkable in appearance. No axillary lymphadenopathy.  Lungs/Pleura: No suspicious appearing pulmonary nodules or masses. No acute consolidative airspace disease. No pleural effusions.  Musculoskeletal/Soft Tissues: Old healed fracture of the left clavicle. There are no aggressive appearing lytic or blastic lesions noted in the visualized portions of the skeleton.  CTA ABDOMEN AND PELVIS FINDINGS  Hepatobiliary: No definite cystic or solid hepatic lesions. No intra or extrahepatic biliary ductal dilatation. Status post cholecystectomy.  Pancreas: Well-defined 12 mm low-attenuation lesion in the body of the pancreas (axial image 183 of series 5). No other suspicious appearing pancreatic mass. No pancreatic ductal dilatation. No pancreatic or peripancreatic fluid or inflammatory changes.  Spleen: The wedge-shaped hypovascular area in the lateral aspect of the  spleen could reflect a recent splenic infarct, best appreciated on image 164 of series 5 and coronal image 96 of series 8).  Adrenals/Urinary Tract: 2 wedge-shaped areas of hypoenhancement are noted in the lower pole of the left kidney, concerning for potential recent infarcts. Subcentimeter low-attenuation lesion in the upper pole the right kidney is too small to definitively characterize. 1.9 cm parapelvic cyst in the interpolar region of the right kidney. No hydroureteronephrosis. Urinary bladder is normal in appearance.  Stomach/Bowel: Normal appearance of the stomach. No pathologic dilatation of small bowel or colon. The appendix is not confidently identified and may be surgically absent. Regardless, there are no inflammatory changes noted adjacent to the cecum to suggest the presence of an acute appendicitis at this time.  Vascular/Lymphatic: Aortic atherosclerosis, without evidence of aneurysm or dissection in the abdominal or pelvic vasculature. Vascular findings and measurements pertinent to potential TAVR procedure, as detailed below. Celiac axis, superior mesenteric artery and inferior mesenteric artery are  all widely patent without hemodynamically significant stenosis. Single left and two right renal arteries are widely patent. No lymphadenopathy noted in the abdomen or pelvis.  Reproductive: Prostate gland and seminal vesicles are unremarkable in appearance.  Other: No significant volume of ascites.  No pneumoperitoneum.  Musculoskeletal: There are no aggressive appearing lytic or blastic lesions noted in the visualized portions of the skeleton.  VASCULAR MEASUREMENTS PERTINENT TO TAVR:  AORTA:  Minimal Aortic Diameter -  13 x 16 mm  Severity of Aortic Calcification -  severe  RIGHT PELVIS:  Right Common Iliac Artery -  Minimal Diameter - 8.9 x 6.1 mm  Tortuosity - mild  Calcification - moderate  Right External Iliac Artery -  Minimal  Diameter - 8.6 x 7.8 mm  Tortuosity - mild  Calcification - mild  Right Common Femoral Artery -  Minimal Diameter - 8.1 x 6.3 mm  Tortuosity - mild  Calcification - mild  LEFT PELVIS:  Left Common Iliac Artery -  Minimal Diameter - 9.5 x 8.9 mm  Tortuosity - mild  Calcification - moderate  Left External Iliac Artery -  Minimal Diameter - 7.9 x 6.3 mm  Tortuosity - mild  Calcification - mild  Left Common Femoral Artery -  Minimal Diameter - 8.6 x 5.2 mm  Tortuosity - mild  Calcification - mild  Review of the MIP images confirms the above findings.  IMPRESSION: 1. Vascular findings and measurements pertinent to potential TAVR procedure, as detailed above. This patient does appear to have suitable pelvic arterial access bilaterally. 2. Cardiomegaly with left atrial dilatation. Notably, there is a filling defect in the tip of the left atrial appendage highly concerning for left atrial appendage thrombus. If present, this places the patient at risk for potential systemic embolization. Correlation with transesophageal echocardiography is strongly recommended if clinically appropriate. 3. Importantly, there are peripheral wedge-shaped hypovascular areas in both the spleen and the lower pole of the left kidney, which could reflect recent embolic infarcts. 4. Severe thickening calcification of the aortic valve, compatible with the reported clinical history of aortic stenosis. 5. Aortic atherosclerosis, in addition to left main and 3 vessel coronary artery disease. Status post median sternotomy for CABG including LIMA to the LAD. 6. Mildly enlarged low right paratracheal lymph node. This is nonspecific, and could be chronic and reactive in this patient with prior history of sternotomy. No definite other findings to suggest malignancy noted elsewhere in the chest, abdomen or pelvis. 7. 12 mm low-attenuation lesion in the body of the pancreas.  This is nonspecific. Follow-up pancreatic protocol CT or MRI with contrast is recommended in 2 years to ensure the stability of this lesion. This recommendation follows ACR consensus guidelines: Management of Incidental Pancreatic Cysts: A White Paper of the ACR Incidental Findings Committee. J Am Coll Radiol 2017;14:911-923. 8. Additional incidental findings, as above.   Electronically Signed   By: Trudie Reed M.D.   On: 04/06/2017 12:20    STS Risk Calculator  Procedure    Redo CABG + AVR  Risk of Mortality   3.7% Morbidity or Mortality  21.9% Prolonged LOS   10.2% Short LOS    30.8% Permanent Stroke   1.7% Prolonged Vent Support  15.4% DSW Infection    0.5% Renal Failure    4.9% Reoperation    9.9%    Impression:  Patient has stage D severe symptomatic aortic stenosis. He presents with progressive symptoms of exertional shortness of breath, chest tightness, and fatigue consistent with chronic  diastolic congestive heart failure, New York Heart Association functional class IIIB.  He has also been having some dizzy spells without syncope. I have personally reviewed the patient's recent transthoracic echocardiogram, diagnostic cardiac catheterization, and CT angiograms. Echocardiogram confirms the presence of severe aortic stenosis. The aortic valve appears trileaflet with severe thickening, fibrosis, calcification, and restricted leaflet mobility involving all 3 leaflets. Peak velocity across the aortic valve measured greater than 4 m/s corresponding to mean transvalvular gradient estimated close to 40 mmHg. Diagnostic cardiac catheterization confirmed the presence of severe aortic stenosis with mean transvalvular gradient measured 43 mmHg. The patient has severe multivessel coronary artery disease but continued patency of the bypass grafts placed in 2004 at the time of his previous surgery. He does have significant disease in a right coronary artery that has not been  revascularized, but this vessel appears relatively small and nondominant.  I agree the patient would benefit from aortic valve replacement. Risks associated with conventional surgery would be at least moderately elevated because of the patient's age, numerous comorbid medical problems, and previous coronary artery bypass surgery.  He also has somewhat limited mobility and I suspect that recovery following conventional surgery would be quite slow.  Cardiac gated CT angiogram of the heart reveals anatomical findings consistent with severe aortic stenosis suitable for transcatheter aortic valve replacement without any significant complicating features. CT angiogram of the aorta and iliac vessels demonstrates moderate atherosclerotic disease particular in the femoral vessels but what appears to be adequate pelvic vascular access for a transfemoral approach.  Question was raised at the time of CT angiogram of the chest regarding the possibility of a clot in the distal portion of the left atrial appendage. However, this was not seen on the cardiac gated CT and gram of the heart and the patient was continuously anticoagulated and therapeutic on warfarin at the time.  Finally, the patient did have a very small benign-appearing low-attenuation lesion in the pancreas noted on CT scan. This requires follow-up imaging in 2 years.   Plan:  The patient and his wife and close friend were counseled at length regarding treatment alternatives for management of severe symptomatic aortic stenosis. Alternative approaches such as conventional aortic valve replacement, transcatheter aortic valve replacement, and palliative medical therapy were compared and contrasted at length.  The risks associated with conventional surgical aortic valve replacement were been discussed in detail, as were expectations for post-operative convalescence, and why I would be reluctant to consider this patient a candidate for conventional surgery.  Issues  specific to transcatheter aortic valve replacement were discussed including questions about long term valve durability, the potential for paravalvular leak, possible increased risk of need for permanent pacemaker placement, and other technical complications related to the procedure itself.  Long-term prognosis with medical therapy was discussed. This discussion was placed in the context of the patient's own specific clinical presentation and past medical history.  All of their questions been addressed.  The patient is interested in proceeding with transcatheter aortic valve replacement in the near future as an alternative to conventional surgery.  We tentatively plan to proceed with transcatheter aortic valve replacement via transfemoral approach on 05/09/2017  Following the decision to proceed with transcatheter aortic valve replacement, a discussion has been held regarding what types of management strategies would be attempted intraoperatively in the event of life-threatening complications, including whether or not the patient would be considered a candidate for the use of cardiopulmonary bypass and/or conversion to open sternotomy for attempted surgical intervention.  The patient has been advised of a variety of complications that might develop including but not limited to risks of death, stroke, paravalvular leak, aortic dissection or other major vascular complications, aortic annulus rupture, device embolization, cardiac rupture or perforation, mitral regurgitation, acute myocardial infarction, arrhythmia, heart block or bradycardia requiring permanent pacemaker placement, congestive heart failure, respiratory failure, renal failure, pneumonia, infection, other late complications related to structural valve deterioration or migration, or other complications that might ultimately cause a temporary or permanent loss of functional independence or other long term morbidity.  The patient provides full informed  consent for the procedure as described and all questions were answered.   I spent in excess of 90 minutes during the conduct of this office consultation and >50% of this time involved direct face-to-face encounter with the patient for counseling and/or coordination of their care.    Salvatore Decent. Cornelius Moras, MD 04/12/2017 2:58 PM

## 2017-05-09 NOTE — Care Management Note (Signed)
Case Management Note Donn PieriniKristi Markeem Noreen RN, BSN Unit 2W-Case Manager-- 2H coverage [phone]  Patient Details  Name: Clifford ShamWilliam T Arganbright MRN: [id number] Date of Birth: May 28, 1941  Subjective/Objective:  Pt admitted s/p TAVR on 05/09/17                  Action/Plan: PTA pt lived at home with spouse- anticipate return home- CM to follow  Expected Discharge Date:                  Expected Discharge Plan:  Home/Self Care  In-House Referral:     Discharge planning Services  CM Consult  Post Acute Care Choice:    Choice offered to:     DME Arranged:    DME Agency:     HH Arranged:    HH Agency:     Status of Service:  In process, will continue to follow  If discussed at Long Length of Stay Meetings, dates discussed:    Discharge Disposition:   Additional Comments:  Darrold SpanWebster, Christana Angelica Hall, RN 05/09/2017, 10:57 AM

## 2017-05-09 NOTE — Plan of Care (Signed)
Problem: Safety: Goal: Ability to remain free from injury will improve Outcome: Progressing Pt will not have any falls or bleeding  Problem: Pain Managment: Goal: General experience of comfort will improve Outcome: Progressing Post-op pain will be controlled  Problem: Physical Regulation: Goal: Will remain free from infection Outcome: Progressing No redness or swelling of groin sites

## 2017-05-09 NOTE — Progress Notes (Signed)
  Echocardiogram 2D Echocardiogram has been performed.  Mily Malecki T Trenae Brunke 05/09/2017, 10:50 AM

## 2017-05-09 NOTE — Anesthesia Postprocedure Evaluation (Signed)
Anesthesia Post Note  Patient: Clifford King  Procedure(s) Performed: Procedure(s) (LRB): TRANSCATHETER AORTIC VALVE REPLACEMENT, TRANSFEMORAL (N/A) TRANSESOPHAGEAL ECHOCARDIOGRAM (TEE) (N/A)     Patient location during evaluation: PACU Anesthesia Type: MAC Level of consciousness: awake and alert Pain management: pain level controlled Vital Signs Assessment: post-procedure vital signs reviewed and stable Respiratory status: spontaneous breathing, nonlabored ventilation, respiratory function stable and patient connected to nasal cannula oxygen Cardiovascular status: stable and blood pressure returned to baseline Anesthetic complications: no    Last Vitals:  Vitals:   05/09/17 1045 05/09/17 1100  BP: 134/73 (!) 143/64  Pulse: (!) 52 (!) 56  Resp: 11 16  Temp:  36.1 C    Last Pain:  Vitals:   05/09/17 1100  TempSrc: Oral                 Mahum Betten,W. EDMOND

## 2017-05-09 NOTE — Transfer of Care (Signed)
Immediate Anesthesia Transfer of Care Note  Patient: Clifford King  Procedure(s) Performed: Procedure(s): TRANSCATHETER AORTIC VALVE REPLACEMENT, TRANSFEMORAL (N/A) TRANSESOPHAGEAL ECHOCARDIOGRAM (TEE) (N/A)  Patient Location: ICU  Anesthesia Type:MAC  Level of Consciousness: drowsy and patient cooperative  Airway & Oxygen Therapy: Patient Spontanous Breathing and Patient connected to face mask oxygen  Post-op Assessment: Report given to RN and Post -op Vital signs reviewed and stable  Post vital signs: Reviewed and stable  Last Vitals:  Vitals:   05/09/17 0654 05/09/17 0908  BP: 127/67   Pulse: 67 (!) 47  Resp: 20   Temp: 36.7 C     Last Pain:  Vitals:   05/09/17 0654  TempSrc: Oral         Complications: No apparent anesthesia complications

## 2017-05-10 ENCOUNTER — Encounter (HOSPITAL_COMMUNITY): Payer: Self-pay | Admitting: Cardiovascular Disease

## 2017-05-10 ENCOUNTER — Inpatient Hospital Stay (HOSPITAL_COMMUNITY): Payer: Medicare Other

## 2017-05-10 DIAGNOSIS — Z952 Presence of prosthetic heart valve: Secondary | ICD-10-CM

## 2017-05-10 DIAGNOSIS — I350 Nonrheumatic aortic (valve) stenosis: Secondary | ICD-10-CM

## 2017-05-10 DIAGNOSIS — Z954 Presence of other heart-valve replacement: Secondary | ICD-10-CM

## 2017-05-10 LAB — CBC
HEMATOCRIT: 41.5 % (ref 39.0–52.0)
Hemoglobin: 13.6 g/dL (ref 13.0–17.0)
MCH: 29.1 pg (ref 26.0–34.0)
MCHC: 32.8 g/dL (ref 30.0–36.0)
MCV: 88.7 fL (ref 78.0–100.0)
PLATELETS: 86 10*3/uL — AB (ref 150–400)
RBC: 4.68 MIL/uL (ref 4.22–5.81)
RDW: 13.8 % (ref 11.5–15.5)
WBC: 5.7 10*3/uL (ref 4.0–10.5)

## 2017-05-10 LAB — BASIC METABOLIC PANEL
ANION GAP: 6 (ref 5–15)
BUN: 11 mg/dL (ref 6–20)
CALCIUM: 8.6 mg/dL — AB (ref 8.9–10.3)
CO2: 28 mmol/L (ref 22–32)
Chloride: 105 mmol/L (ref 101–111)
Creatinine, Ser: 0.94 mg/dL (ref 0.61–1.24)
Glucose, Bld: 129 mg/dL — ABNORMAL HIGH (ref 65–99)
Potassium: 3.6 mmol/L (ref 3.5–5.1)
Sodium: 139 mmol/L (ref 135–145)

## 2017-05-10 LAB — PROTIME-INR
INR: 1.13
PROTHROMBIN TIME: 14.6 s (ref 11.4–15.2)

## 2017-05-10 LAB — MAGNESIUM: MAGNESIUM: 1.8 mg/dL (ref 1.7–2.4)

## 2017-05-10 MED ORDER — POTASSIUM CHLORIDE 10 MEQ/50ML IV SOLN
10.0000 meq | INTRAVENOUS | Status: AC
  Administered 2017-05-10 (×3): 10 meq via INTRAVENOUS
  Filled 2017-05-10 (×3): qty 50

## 2017-05-10 MED ORDER — ASPIRIN EC 81 MG PO TBEC
81.0000 mg | DELAYED_RELEASE_TABLET | Freq: Every day | ORAL | Status: DC
  Administered 2017-05-11: 81 mg via ORAL
  Filled 2017-05-10: qty 1

## 2017-05-10 MED ORDER — HALOPERIDOL LACTATE 5 MG/ML IJ SOLN: 1.0000 mg | Freq: Once | INTRAMUSCULAR | Status: DC

## 2017-05-10 MED ORDER — ASPIRIN 81 MG PO TABS: 81.0000 mg | ORAL_TABLET | Freq: Every day | ORAL | Status: DC

## 2017-05-10 MED ORDER — LISINOPRIL 10 MG PO TABS
20.0000 mg | ORAL_TABLET | Freq: Every day | ORAL | Status: DC
  Administered 2017-05-10 – 2017-05-11 (×2): 20 mg via ORAL
  Filled 2017-05-10: qty 2
  Filled 2017-05-10: qty 1

## 2017-05-10 MED ORDER — WARFARIN SODIUM 2.5 MG PO TABS
12.5000 mg | ORAL_TABLET | Freq: Once | ORAL | Status: AC
  Administered 2017-05-10: 12.5 mg via ORAL
  Filled 2017-05-10: qty 1

## 2017-05-10 MED ORDER — POTASSIUM CHLORIDE ER 10 MEQ PO TBCR
10.0000 meq | EXTENDED_RELEASE_TABLET | Freq: Every day | ORAL | Status: DC
  Administered 2017-05-10: 10 meq via ORAL
  Filled 2017-05-10 (×2): qty 1

## 2017-05-10 MED ORDER — MOVING RIGHT ALONG BOOK
Freq: Once | Status: AC
  Administered 2017-05-11: 11:00:00
  Filled 2017-05-10: qty 1

## 2017-05-10 MED ORDER — ALLOPURINOL 100 MG PO TABS
100.0000 mg | ORAL_TABLET | Freq: Every day | ORAL | Status: DC
  Administered 2017-05-10 – 2017-05-11 (×2): 100 mg via ORAL
  Filled 2017-05-10 (×2): qty 1

## 2017-05-10 MED ORDER — AMLODIPINE BESYLATE 10 MG PO TABS
10.0000 mg | ORAL_TABLET | Freq: Every day | ORAL | Status: DC
  Administered 2017-05-10 – 2017-05-11 (×2): 10 mg via ORAL
  Filled 2017-05-10 (×2): qty 1

## 2017-05-10 MED ORDER — ASPIRIN EC 325 MG PO TBEC
325.0000 mg | DELAYED_RELEASE_TABLET | Freq: Every day | ORAL | Status: AC
  Administered 2017-05-10: 325 mg via ORAL
  Filled 2017-05-10: qty 1

## 2017-05-10 MED ORDER — SIMVASTATIN 10 MG PO TABS
10.0000 mg | ORAL_TABLET | Freq: Every day | ORAL | Status: DC
  Filled 2017-05-10: qty 1

## 2017-05-10 MED ORDER — WARFARIN - PHARMACIST DOSING INPATIENT: Freq: Every day | Status: DC

## 2017-05-10 MED ORDER — FUROSEMIDE 40 MG PO TABS
40.0000 mg | ORAL_TABLET | Freq: Every day | ORAL | Status: DC
  Administered 2017-05-10 – 2017-05-11 (×2): 40 mg via ORAL
  Filled 2017-05-10 (×2): qty 1

## 2017-05-10 NOTE — Progress Notes (Signed)
[address]       [place] [address]             [phone]        CARDIOTHORACIC SURGERY PROGRESS NOTE   R1 Day Post-Op Procedure(s) (LRB): TRANSCATHETER AORTIC VALVE REPLACEMENT, TRANSFEMORAL (N/A) TRANSESOPHAGEAL ECHOCARDIOGRAM (TEE) (N/A)  Subjective: Some mild confusion overnight.  Otherwise looks very good.  Ambulated around ICU.  No pain, SOB  Objective: Vital signs: BP Readings from Last 1 Encounters:  05/10/17 (!) 172/92   Pulse Readings from Last 1 Encounters:  05/10/17 78   Resp Readings from Last 1 Encounters:  05/10/17 20   Temp Readings from Last 1 Encounters:  05/10/17 98.6 F (37 C) (Oral)    Hemodynamics:    Physical Exam:  Rhythm:   Afib w/ controlled rate  Breath sounds: clear  Heart sounds:  Irregular, no murmur  Incisions:  Both groins okay  Abdomen:  Soft, non-distended, non-tender  Extremities:  Warm, well-perfused    Intake/Output from previous day: 06/26 0701 - 06/27 0700 In: 3191.6 [P.O.:340; I.V.:2501.6; IV Piggyback:350] Out: 3380 [Urine:3360; Blood:20] Intake/Output this shift: Total I/O In: 50 [IV Piggyback:50] Out: -   Lab Results:  CBC: Recent Labs  05/09/17 1014 05/09/17 1017 05/10/17 0442  WBC 2.9*  --  5.7  HGB 12.3* 12.6* 13.6  HCT 38.4* 37.0* 41.5  PLT 76*  --  86*    BMET:  Recent Labs  05/09/17 0906 05/09/17 1017 05/10/17 0442  NA 141 142 139  K 4.1 4.1 3.6  CL 101  --  105  CO2  --   --  28  GLUCOSE 161* 148* 129*  BUN 16  --  11  CREATININE 0.80  --  0.94  CALCIUM  --   --  8.6*     PT/INR:   Recent Labs  05/10/17 0442  LABPROT 14.6  INR 1.13    CBG (last 3)  No results for input(s): GLUCAP in the last 72 hours.  ABG    Component Value Date/Time   PHART 7.429 05/09/2017 1057   PCO2ART 40.8 05/09/2017 1057   PO2ART 60.0 (L) 05/09/2017 1057   HCO3 27.2 05/09/2017 1057   TCO2 28 05/09/2017 1057   O2SAT 92.0 05/09/2017 1057    CXR: PORTABLE CHEST 1  VIEW  COMPARISON:  05/09/2017 .  05/02/2017.  FINDINGS: Right IJ line in stable position. Prior CABG. Aortic valve replacement. Stable cardiomegaly. Mild basilar interstitial scratched it mild bilateral interstitial prominence again noted suggesting mild CHF. Right costophrenic angle not imaged. Slight elevation left hemidiaphragm. No pneumothorax.  IMPRESSION: 1.  Right IJ line stable position.  2. Prior CABG. Severe cardiomegaly again noted. Mild pulmonary venous congestion with basilar interstitial prominence noted consist with mild CHF.   Electronically Signed   By: Maisie Fus  Register   On: 05/10/2017 07:39   EKG: Afib w/out acute ischemic changes, LBBB (old)   Assessment/Plan: S/P Procedure(s) (LRB): TRANSCATHETER AORTIC VALVE REPLACEMENT, TRANSFEMORAL (N/A) TRANSESOPHAGEAL ECHOCARDIOGRAM (TEE) (N/A)  Doing well POD1 Maintaining stable rate-controlled Afib w/ stable BP Breathing comfortably w/ O2 sats 94-96% on RA Chronic combined systolic and diastolic CHF with expected post-op volume excess, although weight reportedly < preop (?) Longstanding persistent atrial fibrillation Hypertension CAD Cerebrovascular disease Arthritis Physical deconditioning   Mobilize  Restart Norvasc and lisinopril  Restart beta blocker at reduced dose and titrate for HR  Continue ASA and restart Coumadin vs DOAC via trial  Routine ECHO  Transfer tele  Anticipate likely d/c home tomorrow   I spent in excess of 15 minutes during the conduct of this hospital encounter and >50% of this time involved direct face-to-face encounter with the patient for counseling and/or coordination of their care.   Purcell Nailslarence H Amai Cappiello, MD 05/10/2017 7:55 AM

## 2017-05-10 NOTE — Plan of Care (Signed)
Problem: Activity: Goal: Ability to return to baseline activity level will improve Outcome: Progressing Pt will ambulate without SOB  Problem: Cardiovascular: Goal: Vascular access site(s) Level 0-1 will be maintained Outcome: Progressing Sites will have no redness, swelling or hematoma

## 2017-05-10 NOTE — Discharge Instructions (Signed)
ACTIVITY AND EXERCISE °• Daily activity and exercise are an important part °of your recovery. People recover at different rates °depending on their general health and type of °valve procedure. °• Most people require six to 10 weeks to feel °recovered. °• No lifting, pushing, pulling more than 10 pounds °(examples to avoid: groceries, vacuuming, °gardening, golfing): °- For one week with a procedure through the groin. °- For six weeks for procedures through the chest °wall. °- For three months for procedures through the °breast-bone. °• After the initial healing process of the access site, °we recommend cardiac rehabilitation for all TAVR °patients. Cardiac rehabilitation will help you: °- Rebuild stamina, strength and balance. °- Learn how to participate in activities safely, as well °as help you regain confidence to do so. °- Return to activities of daily living and leisure. °• Discuss attending cardiac rehabilitation at your °follow-up appointment  ° °DRIVING °• Do not drive for four weeks after the date of your °procedure. °• If you have been told by your doctor in the past °that you may not drive, you must talk with him/her °before you begin driving again. °• When you resume driving, you must have someone °with you. ° °HYGIENE °If you had a femoral (leg) procedure, you may take a shower when you return home. After the shower, pat the °site dry. Do NOT use powder, oils or lotions in your groin area until the site has completely healed. °• If you had a chest procedure, you may shower when you return home unless specifically instructed not to by °your discharging practitioner. °- DO NOT scrub incision; pat dry with a towel °- DO NOT apply any lotions, oils, powders to the incision °- No tub baths / swimming for at least six weeks. ° °SITE CARE °• You likely will have small openings in both groins °from catheters used during the procedure. If you °had a transfemoral procedure, one groin will have a °larger opening  and may be bruised or tender. °• If you had a chest procedure, you will have either a °small incision in your upper sternum (breast-bone) °or between your ribs on your left side. °- Chest wall site: The surgical incision should be °kept dry (no lotions / oils / powders) and open °to air. If you experience irritation from clothing °rubbing on the incision, a light gauze dressing °may be applied. °- Inspect your incision daily; notify your °physician if there is increased redness, swelling °or drainage from the incision. °- If the incision is located on your breast-bone °you must avoid lifting objects heavier than a °gallon of milk (eight pounds) and stretching / °twisting / pulling with your arms for at least °three months to ensure strong bone healing. ° ° °CONTACT [phone] °• Check your sites daily. Contact our office if you have °any of the following problems: °- Redness and warmth that does not go away °- Yellow or green drainage from the wound °- Fever and chills °- Increasing numbness in your legs °- Worsening pain at the site °• If you had a leg/groin procedure, it is normal to °have bruising or a soft lump at the site. It is not °normal if the lump suddenly becomes larger or °more firm. This may mean you are bleeding. If this °happens: °- Lie down °- Have someone press down hard, just above °the hole in your skin where the procedure was °performed for 15 minutes. If after holding on the °site, the lump does not become larger or harder, °they   are performing this correctly. °- If the bleeding has stopped after 15 minutes, rest °and stay laying down for at least two hours. °- If the bleeding continues, call 911 for an °ambulance. Do NOT drive yourself or have °someone else drive you. °

## 2017-05-10 NOTE — Progress Notes (Signed)
CARDIAC REHAB PHASE I   PRE:  Rate/Rhythm: 71 afib  BP:  Supine:   Sitting: 101/52  Standing:    SaO2: 96%RA  MODE:  Ambulation: 450 ft Then 35400ft  POST:  Rate/Rhythm: 81  BP:  Supine:   Sitting: 116/52  Standing:    SaO2: 97%RA 1306-1357 Pt did not want to use rolling walker but his cane. We walked 450 ft with asst x 1 and cane and he was wobbly. Came back and let him rest and walked 300 ft with rolling walker with very little assistance. He agreed steadier with walker. Would recommend walker for home use. Will notify case manager.   Luetta Nuttingharlene Djuan Talton, RN BSN  05/10/2017 1:45 PM

## 2017-05-10 NOTE — Discharge Summary (Signed)
Physician Discharge Summary  Patient ID: Clifford King MRN: [id number] DOB/AGE: 74-Jun-1942 74 y.o.  Admit date: 05/09/2017 Discharge date: 05/11/2017  Admission Diagnoses:Severe aortic stenosis  Discharge Diagnoses:  Principal Problem:   S/P TAVR (transcatheter aortic valve replacement) Active Problems:   Essential hypertension, benign   CAD, NATIVE VESSEL   Longstanding persistent atrial fibrillation (HCC)   Severe aortic stenosis   LBBB (left bundle branch block)   S/P CABG x 2  Patient Active Problem List   Diagnosis Date Noted  . S/P TAVR (transcatheter aortic valve replacement) 05/09/2017  . LBBB (left bundle branch block)   . 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 Severe aortic stenosis   . Encounter for therapeutic drug monitoring 12/27/2013  . Long term (current) use of anticoagulants 02/21/2011  . Essential hypertension, benign 10/19/2009  . CAROTID ARTERY DISEASE 10/19/2009  . HYPERLIPIDEMIA-MIXED 08/06/2009  . CAD, NATIVE VESSEL 08/06/2009  . Longstanding persistent atrial fibrillation (HCC) 08/06/2009  . S/P CABG x 2 02/17/2003     HPI:  Patient is a 76 year old male with history of coronary artery disease status post coronary artery bypass grafting in 2004, aortic stenosis, long standing persistent atrial fibrillation on warfarin anticoagulation, left bundle branch block, hyperlipidemia, peripheral vascular disease, chronic venous insufficiency, instability of gait with mildly reduced mobility, remote history of tobacco use, and degenerative disease of the cervical spine with remote history of injury to the neck who has been referred for surgical consultation to discuss treatment options for management of severe symptomatic aortic stenosis. The patient's cardiac history dates back nearly 15 years ago when the patient presented with angina pectoris. He was found to have single-vessel coronary artery disease and underwent PCI and stenting of the left anterior descending  coronary artery. He developed restenosis requiring multiple interventions, and eventually he underwent off-pump coronary artery bypass grafting 2 by Dr. Tyrone SageGerhardt in 2004. Grafts placed at the time of surgery included left internal mammary artery to the distal left anterior descending coronary artery and saphenous vein graft to the diagonal branch. More recently the patient developed persistent atrial fibrillation which failed an attempt at DC cardioversion, and he has been chronically anticoagulated using warfarin for several years. He has known of the presence of a heart murmur for least 10 years, and recently he has been followed by Dr. Diona BrownerMcDowell with serial echocardiograms. Over the past 6-12 months the patient complains of progressive symptoms of exertional shortness of breath, chest tightness, and fatigue. He now gets short of breath with very mild activity and occasionally at rest. He reports episodes of PND and dizzy spells without syncope. He has chronic lower extremity edema.  He was seen in follow-up by Dr. Diona BrownerMcDowell and repeat echocardiogram performed 02/21/2017 revealed severe aortic stenosis. Peak velocity across the aortic valve measured nearly 4.2 m/s corresponding to mean transvalvular gradient estimated 39 mmHg. The DVI was quite low at 0.19.  Left ventricular systolic function was mildly reduced with ejection fraction estimated 45-50%.  The patient was referred to the multidisciplinary heart valve clinic and underwent left and right heart catheterization by Dr. Excell Seltzerooper on 03/29/2017. Catheterization confirmed the presence of severe aortic stenosis with mean transvalvular gradient measured 43 mmHg by catheterization and aortic valve area calculated 0.92 cm.  The patient was noted to have severe multivessel coronary artery disease with 100% chronic occlusion of the left anterior descending coronary artery but continued patency of the left internal mammary artery and saphenous vein grafts. There was  moderate disease involving a small nondominant right  coronary artery.  Pulmonary artery pressures were moderately elevated. The patient subsequently underwent CT angiography and was referred for surgical consultation. Of note, CT angiogram of the chest, abdomen and pelvis was reported to be concerning for the possibility of a small clot in the distal portion of left atrial appendage as well as a small benign-appearing low-attenuation lesion in the pancreas. However, cardiac gated CT angiogram of the heart was not suggestive of left atrial thrombus. Since that time the patient's prothrombin time has been checked and he remains therapeutic on warfarin.  The patient is married and lives with his wife in [place]. 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  He has remained reasonably active physically in retirement although he complains that recently he has been severely limited by progressive exertional shortness of breath, chest tightness, and fatigue. He also is limited by instability of gait with chronic pain in his left foot related to heel spurs. He has some poor balance and difficulty walking. In 2011 he suffered a fall and broke one of the vertebra in his neck, and he now has limited mobility of the cervical spine.  The patient was admitted electively for TAVR  Discharged Condition: good  Hospital Course: The patient was admitted for T AVR and on 05/09/2017 he went to the operating room where he underwent the below described procedure by Drs. Cornelius Moras and [employer]. He tolerated well was taken to the surgical intensive care unit in stable condition.  Postoperative hospital course:  Patient has done quite well. He is maintaining stable rate controlled atrial fibrillation which is chronic with adequate blood pressure. Oxygen has been weaned and he maintains good saturations on room air. He has been restarted on his Norvasc and lisinopril and beta blocker at a reduced dose which  will be titrated for heart rate. He is currently on aspirin and coumadin. An echocardiogram will be obtained prior to discharge per routine. He is tolerating routine cardiac rehabilitation modalities. Groins show no evidence of hematoma. At time of discharge is felt to be quite stable. Echocardiogram looks acceptable per Dr Excell Seltzer with no paravalvular leak or significant gradiant, , final report pending.   Consults: cardiology  Significant Diagnostic Studies: post op echocardiogram  Treatments: surgery:   TAVR OPERATIVE NOTE   Date of Procedure:                05/09/2017  Preoperative Diagnosis:      Severe Aortic Stenosis   Postoperative Diagnosis:    Same   Procedure:        Transcatheter Aortic Valve Replacement - Percutaneous Left Transfemoral Approach             Edwards Sapien 3 THV (size 29 mm, model # 9600TFX, serial # [id number])              Co-Surgeons:                        Salvatore Decent. Cornelius Moras, MD and Tonny Bollman, MD  Anesthesiologist:                  Rosezella Florida, MD  Echocardiographer:              Tobias Alexander, MD  Pre-operative Echo Findings: ? Severe aortic stenosis ? Moderate LV systolic dysfunction  Post-operative Echo Findings: ? No paravalvular leak ? Improved left ventricular systolic function Discharge Exam: Blood pressure (!) 154/66, pulse 85, temperature  97.9 F (36.6 C), temperature source Oral, resp. rate 18, height 6\' 5"  (1.956 m), weight 254 lb (115.2 kg), SpO2 97 %.  General appearance: alert, cooperative and no distress Heart: irregularly irregular rhythm Lungs: clear to auscultation bilaterally Abdomen: benign Extremities: no edema Wound: groins without hematoma or echymosis Neuro -confused but re-orients easily  Disposition: 01-Home or Self Care   Allergies as of 05/11/2017      Reactions   Vicodin [hydrocodone-acetaminophen] Other (See Comments)   hallucinations      Medication List    STOP taking these  medications   enoxaparin 120 MG/0.8ML injection Commonly known as:  LOVENOX     TAKE these medications   allopurinol 100 MG tablet Commonly known as:  ZYLOPRIM Take 100 mg by mouth daily.   amLODipine 10 MG tablet Commonly known as:  NORVASC TAKE 1 TABLET BY MOUTH DAILY What changed:  See the new instructions.   aspirin 81 MG tablet Take 81 mg by mouth daily.   furosemide 40 MG tablet Commonly known as:  LASIX Take 1 tablet (40 mg total) by mouth daily.   lisinopril 20 MG tablet Commonly known as:  PRINIVIL,ZESTRIL Take 20 mg by mouth daily.   metoprolol succinate 50 MG 24 hr tablet Commonly known as:  TOPROL-XL Take 1 tablet (50 mg total) by mouth daily. Take 1  tablet daily What changed:  how much to take  how to take this  when to take this  additional instructions   [facility] powder Generic drug:  nystatin Apply topically daily as needed (infections).   potassium chloride 10 MEQ tablet Commonly known as:  K-DUR Take 1 tablet (10 mEq total) by mouth daily.   simvastatin 10 MG tablet Commonly known as:  ZOCOR TAKE 1 TABLET BY MOUTH AT BEDTIME   traMADol 50 MG tablet Commonly known as:  ULTRAM Take 1-2 tablets (50-100 mg total) by mouth every 12 (twelve) hours as needed for moderate pain.   warfarin 5 MG tablet Commonly known as:  COUMADIN TAKE AS DIRECTED BY COUMADIN CLINIC What changed:  how much to take  how to take this  when to take this  additional instructions      Follow-up Information    Tonny Bollman, MD Follow up.   Specialty:  Cardiology Why:  please see discharge paperwork for all follow-up appointnments( after Visit Summary) Contact information: [address] [place] [address] [phone]           Signed: Rowe Clack 05/11/2017, 1:28 PM

## 2017-05-10 NOTE — Progress Notes (Signed)
Progress Note  Patient Name: Clifford ShamWilliam T Colavito Date of Encounter: 05/10/2017  Primary Cardiologist: Diona BrownerMcDowell  Subjective   No complaints. Wants to go home. Denies chest pain or shortness of breath  Inpatient Medications    Scheduled Meds: . allopurinol  100 mg Oral Daily  . amLODipine  10 mg Oral Daily  . [START ON 05/11/2017] aspirin EC  81 mg Oral Daily  . furosemide  40 mg Oral Daily  . lisinopril  20 mg Oral Daily  . metoprolol tartrate  12.5 mg Oral BID  . moving right along book   Does not apply Once  . [START ON 05/11/2017] pantoprazole  40 mg Oral Daily  . potassium chloride  10 mEq Oral Daily  . simvastatin  10 mg Oral QHS   Continuous Infusions: . sodium chloride 20 mL/hr at 05/10/17 0600  . albumin human    . cefUROXime (ZINACEF)  IV Stopped (05/10/17 0230)  . potassium chloride 10 mEq (05/10/17 0859)   PRN Meds: albumin human, metoprolol tartrate, midazolam, ondansetron (ZOFRAN) IV, traMADol   Vital Signs    Vitals:   05/10/17 0500 05/10/17 0600 05/10/17 0700 05/10/17 0803  BP: (!) 160/99 (!) 172/92 109/69   Pulse: 78 78 77   Resp: 17 20 16    Temp:    99.1 F (37.3 C)  TempSrc:    Oral  SpO2: 95% 96% 94%   Weight:  254 lb (115.2 kg)    Height:        Intake/Output Summary (Last 24 hours) at 05/10/17 0906 Last data filed at 05/10/17 0759  Gross per 24 hour  Intake           3241.6 ml  Output             3360 ml  Net           -118.4 ml   Filed Weights   05/09/17 0548 05/10/17 0600  Weight: 261 lb (118.4 kg) 254 lb (115.2 kg)    Telemetry    Atrial fibrillation, rate controlled - Personally Reviewed  ECG    Atrial fibrillation, left bundle branch block, no change from baseline- Personally Reviewed  Physical Exam  Alert and awake, comfortable, mildly confused GEN: No acute distress.   Neck: No JVD Cardiac:  irregularly irregular, no murmurs, rubs, or gallops.  Respiratory: Clear to auscultation bilaterally. GI: Soft, nontender,  non-distended  MS: No edema; No deformity. Bilateral groin sites are clear Neuro:  Nonfocal  Psych: Normal affect   Labs    Chemistry Recent Labs Lab 05/09/17 0813 05/09/17 0906 05/09/17 1017 05/10/17 0442  NA 142 141 142 139  K 3.8 4.1 4.1 3.6  CL 102 101  --  105  CO2  --   --   --  28  GLUCOSE 130* 161* 148* 129*  BUN 14 16  --  11  CREATININE 0.80 0.80  --  0.94  CALCIUM  --   --   --  8.6*  GFRNONAA  --   --   --  >60  GFRAA  --   --   --  >60  ANIONGAP  --   --   --  6     Hematology Recent Labs Lab 05/09/17 1014 05/09/17 1017 05/10/17 0442  WBC 2.9*  --  5.7  RBC 4.28  --  4.68  HGB 12.3* 12.6* 13.6  HCT 38.4* 37.0* 41.5  MCV 89.7  --  88.7  MCH 28.7  --  29.1  MCHC 32.0  --  32.8  RDW 14.1  --  13.8  PLT 76*  --  86*    Cardiac EnzymesNo results for input(s): TROPONINI in the last 168 hours. No results for input(s): TROPIPOC in the last 168 hours.   BNPNo results for input(s): BNP, PROBNP in the last 168 hours.   DDimer No results for input(s): DDIMER in the last 168 hours.   Radiology    Dg Chest Port 1 View  Result Date: 05/10/2017 CLINICAL DATA:  Aortic valve replacement . EXAM: PORTABLE CHEST 1 VIEW COMPARISON:  05/09/2017 .  05/02/2017. FINDINGS: Right IJ line in stable position. Prior CABG. Aortic valve replacement. Stable cardiomegaly. Mild basilar interstitial scratched it mild bilateral interstitial prominence again noted suggesting mild CHF. Right costophrenic angle not imaged. Slight elevation left hemidiaphragm. No pneumothorax. IMPRESSION: 1.  Right IJ line stable position. 2. Prior CABG. Severe cardiomegaly again noted. Mild pulmonary venous congestion with basilar interstitial prominence noted consist with mild CHF. Electronically Signed   By: Maisie Fus  Register   On: 05/10/2017 07:39   Dg Chest Port 1 View  Result Date: 05/09/2017 CLINICAL DATA:  Status post transcatheter aortic valve replacement. Former smoker. EXAM: PORTABLE CHEST 1  VIEW COMPARISON:  Preoperative study of May 02, 2017 FINDINGS: The lungs are well-expanded. The interstitial markings are coarse but some of this is chronic. The cardiac silhouette is enlarged. The central pulmonary vascularity is prominent. There is mild cephalization of the vascular pattern. The right internal jugular venous catheter tip projects over the proximal SVC. The sternal wires are intact. The aortic valve cage is visible and appears to be in reasonable position radiographically. There is old deformity of the left clavicle. IMPRESSION: Low-grade CHF superimposed upon chronic bronchitic -smoking related changes. No alveolar edema or pleural effusion. Electronically Signed   By: David  [place] M.D.   On: 05/09/2017 11:12    Cardiac Studies   POD #1 echo pending  Patient Profile     76 y.o. male with progressive symptoms of chronic systolic and diastolic heart failure and severe aortic stenosis who presented June 26 for TAVR  Assessment & Plan    1. Severe aortic stenosis: Patient progressing well postoperative day #1 from TAVR. Will resume warfarin. Mobilizing transferred to a telemetry bed. Plans as outlined by Dr. Cornelius Moras.  2. Chronic combined systolic and diastolic heart failure: The patient has been restarted on his preoperative medical therapy and appears stable.  3. Permanent atrial fibrillation: Heart rate is controlled. His beta blocker will be restarted and he is anticoagulated with warfarin which will be restarted today. He does not qualify for the [facility] TAVI-AF DOAC edoxaban trial because of high creatinine clearance.   Enzo Bi, MD  05/10/2017, 9:06 AM

## 2017-05-11 ENCOUNTER — Other Ambulatory Visit: Payer: Self-pay | Admitting: Surgical

## 2017-05-11 ENCOUNTER — Inpatient Hospital Stay (HOSPITAL_COMMUNITY): Payer: Medicare Other

## 2017-05-11 DIAGNOSIS — I340 Nonrheumatic mitral (valve) insufficiency: Secondary | ICD-10-CM

## 2017-05-11 LAB — CBC
HEMATOCRIT: 41.3 % (ref 39.0–52.0)
Hemoglobin: 13.4 g/dL (ref 13.0–17.0)
MCH: 28.8 pg (ref 26.0–34.0)
MCHC: 32.4 g/dL (ref 30.0–36.0)
MCV: 88.8 fL (ref 78.0–100.0)
Platelets: 84 10*3/uL — ABNORMAL LOW (ref 150–400)
RBC: 4.65 MIL/uL (ref 4.22–5.81)
RDW: 14.4 % (ref 11.5–15.5)
WBC: 5.3 10*3/uL (ref 4.0–10.5)

## 2017-05-11 LAB — BASIC METABOLIC PANEL
ANION GAP: 10 (ref 5–15)
BUN: 12 mg/dL (ref 6–20)
CALCIUM: 9 mg/dL (ref 8.9–10.3)
CO2: 25 mmol/L (ref 22–32)
Chloride: 104 mmol/L (ref 101–111)
Creatinine, Ser: 0.87 mg/dL (ref 0.61–1.24)
GFR calc Af Amer: >60 mL/min (ref >60)
GFR calc non Af Amer: >60 mL/min (ref >60)
GLUCOSE: 112 mg/dL — AB (ref 65–99)
Potassium: 3.4 mmol/L — ABNORMAL LOW (ref 3.5–5.1)
Sodium: 139 mmol/L (ref 135–145)

## 2017-05-11 LAB — PROTIME-INR
INR: 1.2
Prothrombin Time: 15.2 seconds (ref 11.4–15.2)

## 2017-05-11 LAB — ECHOCARDIOGRAM LIMITED
Height: 77 in
Weight: 4064 oz

## 2017-05-11 MED ORDER — TRAMADOL HCL 50 MG PO TABS: 50.0000 mg | ORAL_TABLET | Freq: Two times a day (BID) | ORAL | 0 refills | Status: DC | PRN

## 2017-05-11 MED ORDER — POTASSIUM CHLORIDE ER 10 MEQ PO TBCR
40.0000 meq | EXTENDED_RELEASE_TABLET | Freq: Every day | ORAL | Status: DC
  Administered 2017-05-11: 40 meq via ORAL
  Filled 2017-05-11 (×2): qty 4

## 2017-05-11 MED ORDER — METOPROLOL SUCCINATE ER 50 MG PO TB24: 50.0000 mg | ORAL_TABLET | Freq: Every day | ORAL | 1 refills | Status: DC

## 2017-05-11 MED ORDER — WARFARIN SODIUM 5 MG PO TABS: 5.0000 mg | ORAL_TABLET | Freq: Once | ORAL | Status: DC

## 2017-05-11 MED ORDER — METOPROLOL TARTRATE 25 MG PO TABS: 25.0000 mg | ORAL_TABLET | Freq: Two times a day (BID) | ORAL | Status: DC

## 2017-05-11 NOTE — Care Management Note (Signed)
Case Management Note Donn PieriniKristi Violanda Bobeck RN, BSN Unit 2W-Case Manager-- 2H coverage [phone]  Patient Details  Name: Clifford ShamWilliam T King MRN: [id number] Date of Birth: November 06, 1941  Subjective/Objective:  Pt admitted s/p TAVR on 05/09/17                  Action/Plan: PTA pt lived at home with spouse- anticipate return home- CM to follow  Expected Discharge Date:  05/11/17               Expected Discharge Plan:  Home/Self Care  In-House Referral:     Discharge planning Services  CM Consult  Post Acute Care Choice:  Durable Medical Equipment Choice offered to:     DME Arranged:  Walker rolling DME Agency:  [facility]  [facility]     Status of Service:  Completed, signed off  If discussed at Long Length of Stay Meetings, dates discussed:    Discharge Disposition: home/self care   Additional Comments:  05/11/17- 1340- Tequilla Cous[place]au rN, CM- order placed for RW- [facility]- RW to be delivered to room prior to discharge.  Darrold SpanWebster, Avalynn Bowe Hall, RN 05/11/2017, 1:42 PM

## 2017-05-11 NOTE — Progress Notes (Signed)
Progress Note  Patient Name: Clifford King Date of Encounter: 05/11/2017  Primary Cardiologist: Diona Browner  Subjective   Feels well this morning. Confused overnight, but seems better this morning.   Inpatient Medications    Scheduled Meds: . allopurinol  100 mg Oral Daily  . amLODipine  10 mg Oral Daily  . aspirin EC  81 mg Oral Daily  . furosemide  40 mg Oral Daily  . haloperidol lactate  1 mg Intravenous Once  . lisinopril  20 mg Oral Daily  . metoprolol tartrate  12.5 mg Oral BID  . pantoprazole  40 mg Oral Daily  . potassium chloride  40 mEq Oral Daily  . simvastatin  10 mg Oral QHS  . warfarin  5 mg Oral ONCE-1800  . Warfarin - Pharmacist Dosing Inpatient   Does not apply q1800   Continuous Infusions: . sodium chloride 20 mL/hr at 05/10/17 0600   PRN Meds: metoprolol tartrate, midazolam, ondansetron (ZOFRAN) IV, traMADol   Vital Signs    Vitals:   05/10/17 1144 05/10/17 1359 05/10/17 2013 05/11/17 0553  BP: (!) 149/64 (!) 116/52 (!) 160/68 (!) 154/66  Pulse: 84 78 80 85  Resp:  17 18 18   Temp: 97.7 F (36.5 C) 97.5 F (36.4 C) 98.4 F (36.9 C) 97.9 F (36.6 C)  TempSrc: Oral Oral Oral Oral  SpO2: 95% 97% 96% 97%  Weight:      Height:        Intake/Output Summary (Last 24 hours) at 05/11/17 1201 Last data filed at 05/11/17 0900  Gross per 24 hour  Intake           855.33 ml  Output             1050 ml  Net          -194.67 ml   Filed Weights   05/09/17 0548 05/10/17 0600  Weight: 261 lb (118.4 kg) 254 lb (115.2 kg)    Telemetry    Afib - Personally Reviewed  ECG    N/a - Personally Reviewed  Physical Exam   General: Pleasant older male appearing in no acute distress. Head: Normocephalic, atraumatic.  Neck: Supple without bruits, JVD. Lungs:  Resp regular and unlabored, CTA. Heart: Irreg Irreg, S1, S2, no S3, S4, or murmur; no rub. Abdomen: Soft, non-tender, non-distended with normoactive bowel sounds. No hepatomegaly. No  rebound/guarding. No obvious abdominal masses. Extremities: No clubbing, cyanosis, edema. Distal pedal pulses are 2+ bilaterally. Bilateral femoral sites stable.  Neuro: Alert and oriented but somewhat confused.  Psych: Normal affect.  Labs    Chemistry Recent Labs Lab 05/09/17 0906 05/09/17 1017 05/10/17 0442 05/11/17 0259  NA 141 142 139 139  K 4.1 4.1 3.6 3.4*  CL 101  --  105 104  CO2  --   --  28 25  GLUCOSE 161* 148* 129* 112*  BUN 16  --  11 12  CREATININE 0.80  --  0.94 0.87  CALCIUM  --   --  8.6* 9.0  GFRNONAA  --   --  >60 >60  GFRAA  --   --  >60 >60  ANIONGAP  --   --  6 10     Hematology Recent Labs Lab 05/09/17 1014 05/09/17 1017 05/10/17 0442 05/11/17 0259  WBC 2.9*  --  5.7 5.3  RBC 4.28  --  4.68 4.65  HGB 12.3* 12.6* 13.6 13.4  HCT 38.4* 37.0* 41.5 41.3  MCV 89.7  --  88.7 88.8  MCH 28.7  --  29.1 28.8  MCHC 32.0  --  32.8 32.4  RDW 14.1  --  13.8 14.4  PLT 76*  --  86* 84*    Cardiac EnzymesNo results for input(s): TROPONINI in the last 168 hours. No results for input(s): TROPIPOC in the last 168 hours.   BNPNo results for input(s): BNP, PROBNP in the last 168 hours.   DDimer No results for input(s): DDIMER in the last 168 hours.    Radiology    Dg Chest Port 1 View  Result Date: 05/10/2017 CLINICAL DATA:  Aortic valve replacement . EXAM: PORTABLE CHEST 1 VIEW COMPARISON:  05/09/2017 .  05/02/2017. FINDINGS: Right IJ line in stable position. Prior CABG. Aortic valve replacement. Stable cardiomegaly. Mild basilar interstitial scratched it mild bilateral interstitial prominence again noted suggesting mild CHF. Right costophrenic angle not imaged. Slight elevation left hemidiaphragm. No pneumothorax. IMPRESSION: 1.  Right IJ line stable position. 2. Prior CABG. Severe cardiomegaly again noted. Mild pulmonary venous congestion with basilar interstitial prominence noted consist with mild CHF. Electronically Signed   By: Maisie Fushomas  Register   On:  05/10/2017 07:39    Cardiac Studies   Echo: 05/09/17  Study Conclusions  - Left ventricle: The cavity size was normal. Systolic function was   mildly to moderately reduced. The estimated ejection fraction was   in the range of 40% to 45%. Diffuse hypokinesis. - Aortic valve: Trileaflet; severely thickened, severely calcified   leaflets. Cusp separation was severely reduced. There was severe   stenosis. There was mild regurgitation. Mean gradient (S): 42 mm   Hg. Peak gradient (S): 66 mm Hg. - Mitral valve: There was mild regurgitation. - Left atrium: The atrium was moderately dilated. - Right atrium: The atrium was mildly dilated. - Tricuspid valve: There was mild regurgitation. - Pericardium, extracardiac: There was no pericardial effusion.  Impressions:  - This was a periprocedural TTE during a TAVR procedure performed   under conscious sedation (therefore TTE not TEE).     Native aortic valve was severely thickened and calcified with   severely restricted leaflet opening. Peak/mean transaortic   gradients were 66/42 mmHg, consistent with severe aortic   stenosis. There was mild aortic regurgitation.     A 29 mm Edwards-SAPIEN valve was successfully deployed in the   aortic position. Post-deployment gradients were peak/mean 7/3   mmHg. No aortic regurgitation or paravalvular leak were seen.     Mitral regurgitations was mild prior and post procedure.   Tricuspid regurgitations was mild prior and post procedure.   No pericardial effusion was seen prior and post procedure.     LVEF appeared to be slightly improved after valve deployment from   40-45% to 45-50% with diffuse hypokinesis.  Patient Profile     76 y.o. male with progressive symptoms of chronic systolic and diastolic heart failure and severe aortic stenosis who presented June 26 for TAVR  Assessment & Plan    1. Severe AS: S/p TAVR post op day #2. Doing well overall. Plan for ASA and Coumadin.  -- post  op echo pending.   2. Chronic combined HF: Volume stable on exam. Continue with preadmission home medications.   3. Permanent AF: Rates controlled on telemetry. Coumadin has been restarted. INR 1.2 today. Followed by PCP for coumadin dosing.   4. Hypokalemia: Replaced   Signed, Laverda PageLindsay Roberts, NP  05/11/2017, 12:01 PM    Patient seen, examined. Available data reviewed. Agree with findings, assessment,  and plan as outlined by Laverda Page, NP-C. on exam the patient is alert, mildly confused. JVP is normal. Lungs are clear. Heart is irregularly irregular with a grade 1/6 systolic ejection murmur at the right upper sternal border. There is no diastolic murmur. Extremities show no edema. The patient's echo images are reviewed. He has normal transvalvular gradients and no evidence of paravalvular regurgitation. His LV function appears moderately depressed and there is a conduction abnormality related to his left bundle branch block. Medications are reviewed and he will be continued on warfarin and low-dose aspirin in the setting of permanent atrial fibrillation. Will increase his metoprolol to 25 mg twice daily for better blood pressure control. Will arrange cardiology follow-up with Dr. Diona Browner.  Tonny Bollman, M.D. 05/11/2017 1:19 PM

## 2017-05-11 NOTE — Progress Notes (Signed)
CARDIAC REHAB PHASE I   PRE:  Rate/Rhythm: 87 afib    BP: sitting    SaO2:   MODE:  Ambulation: 300 ft   POST:  Rate/Rhythm: 87 afib    BP: sitting      SaO2: 99 RA  Pt incontinent of stool in bed, sts he was finished. Willing to walk. Used RW. Needed reminders to stay inside legs of RW. Denied SOB, sts he feels well. Able to maneuver RW to recliner. Discussed IS and CRPII and restrictions with pt, wife, and friend. [facility]. He needed to use BR again, assisted to BR without RW.  [phone]   Harriet Massonandi Kristan Nico Syme CES, ACSM 05/11/2017 1:48 PM

## 2017-05-11 NOTE — Progress Notes (Signed)
PT Cancellation Note  Patient Details Name: Clifford ShamWilliam T King MRN: [id number] DOB: 1941-05-20   Cancelled Treatment:    Reason Eval/Treat Not Completed: Other (comment) RN reports pt being discharged and on the way out. Will sign off.   [name], PT, DPT  Acute Rehabilitation Services  Pager: [phone]    [name] 05/11/2017, 3:15 PM

## 2017-05-11 NOTE — Progress Notes (Signed)
ANTICOAGULATION CONSULT NOTE  Pharmacy Consult for warfarin Indication: atrial fibrillation   Assessment: 75 yom on warfarin for afib, now resumed 6/27 post-TAVR. INR up 1.2. Hg wnl, plt low at 84 (chronic). No bleed documented.  TCTS PA ordered warfarin dose for this AM.  PTA warfarin dose: 10mg  daily except 5mg  on Mon/Fri  Goal of Therapy:  INR 2-3 Monitor platelets by anticoagulation protocol: Yes   Plan:  Warfarin 5mg  PO x 1 dose per TCTS dosing today Daily INR Monitor CBC, s/sx bleeding  Clifford BertinHaley Celeste King, PharmD, BCPS Clinical Pharmacist 05/11/2017 10:52 AM

## 2017-05-11 NOTE — Progress Notes (Addendum)
[address]       Clifford King [address]             [phone]      2 Days Post-Op Procedure(s) (LRB): TRANSCATHETER AORTIC VALVE REPLACEMENT, TRANSFEMORAL (N/A) TRANSESOPHAGEAL ECHOCARDIOGRAM (TEE) (N/A) Subjective: Feels well, per nursing - very confused overnight  Objective: Vital signs in last 24 hours: Temp:  [97.5 F (36.4 C)-99.1 F (37.3 C)] 97.9 F (36.6 C) (06/28 0553) Pulse Rate:  [72-85] 85 (06/28 0553) Cardiac Rhythm: Atrial fibrillation (06/28 0700) Resp:  [15-18] 18 (06/28 0553) BP: (116-160)/(52-69) 154/66 (06/28 0553) SpO2:  [95 %-97 %] 97 % (06/28 0553)  Hemodynamic parameters for last 24 hours:    Intake/Output from previous day: 06/27 0701 - 06/28 0700 In: 1025.3 [P.O.:720; I.V.:255.3; IV Piggyback:50] Out: 1250 [Urine:1250] Intake/Output this shift: No intake/output data recorded.  General appearance: alert, cooperative and no distress Heart: irregularly irregular rhythm Lungs: clear to auscultation bilaterally Abdomen: benign Extremities: no edema Wound: groins without hematoma or echymosis Neuro -confused but re-orients easily Lab Results:  Recent Labs  05/10/17 0442 05/11/17 0259  WBC 5.7 5.3  HGB 13.6 13.4  HCT 41.5 41.3  PLT 86* 84*   BMET:  Recent Labs  05/10/17 0442 05/11/17 0259  NA 139 139  K 3.6 3.4*  CL 105 104  CO2 28 25  GLUCOSE 129* 112*  BUN 11 12  CREATININE 0.94 0.87  CALCIUM 8.6* 9.0    PT/INR:  Recent Labs  05/11/17 0259  LABPROT 15.2  INR 1.20   ABG    Component Value Date/Time   PHART 7.429 05/09/2017 1057   HCO3 27.2 05/09/2017 1057   TCO2 28 05/09/2017 1057   O2SAT 92.0 05/09/2017 1057   CBG (last 3)  No results for input(s): GLUCAP in the last 72 hours.  Meds Scheduled Meds: . allopurinol  100 mg Oral Daily  . amLODipine  10 mg Oral Daily  . aspirin EC  81 mg Oral Daily  . furosemide  40 mg Oral Daily  . haloperidol lactate  1 mg Intravenous Once  .  lisinopril  20 mg Oral Daily  . metoprolol tartrate  12.5 mg Oral BID  . moving right along book   Does not apply Once  . pantoprazole  40 mg Oral Daily  . potassium chloride  10 mEq Oral Daily  . simvastatin  10 mg Oral QHS  . Warfarin - Pharmacist Dosing Inpatient   Does not apply q1800   Continuous Infusions: . sodium chloride 20 mL/hr at 05/10/17 0600   PRN Meds:.metoprolol tartrate, midazolam, ondansetron (ZOFRAN) IV, traMADol  Xrays Dg Chest Port 1 View  Result Date: 05/10/2017 CLINICAL DATA:  Aortic valve replacement . EXAM: PORTABLE CHEST 1 VIEW COMPARISON:  05/09/2017 .  05/02/2017. FINDINGS: Right IJ line in stable position. Prior CABG. Aortic valve replacement. Stable cardiomegaly. Mild basilar interstitial scratched it mild bilateral interstitial prominence again noted suggesting mild CHF. Right costophrenic angle not imaged. Slight elevation left hemidiaphragm. No pneumothorax. IMPRESSION: 1.  Right IJ line stable position. 2. Prior CABG. Severe cardiomegaly again noted. Mild pulmonary venous congestion with basilar interstitial prominence noted consist with mild CHF. Electronically Signed   By: Maisie Fus  Register   On: 05/10/2017 07:39   Dg Chest Port 1 View  Result Date: 05/09/2017 CLINICAL DATA:  Status post transcatheter aortic valve replacement. Former smoker. EXAM: PORTABLE CHEST 1 VIEW COMPARISON:  Preoperative study of May 02, 2017 FINDINGS: The lungs are  well-expanded. The interstitial markings are coarse but some of this is chronic. The cardiac silhouette is enlarged. The central pulmonary vascularity is prominent. There is mild cephalization of the vascular pattern. The right internal jugular venous catheter tip projects over the proximal SVC. The sternal wires are intact. The aortic valve cage is visible and appears to be in reasonable position radiographically. There is old deformity of the left clavicle. IMPRESSION: Low-grade CHF superimposed upon chronic bronchitic  -smoking related changes. No alveolar edema or pleural effusion. Electronically Signed   By: David  SwazilandJordan M.D.   On: 05/09/2017 11:12    Assessment/Plan: S/P Procedure(s) (LRB): TRANSCATHETER AORTIC VALVE REPLACEMENT, TRANSFEMORAL (N/A) TRANSESOPHAGEAL ECHOCARDIOGRAM (TEE) (N/A)   1 doing well 2 confusion probably multifactorial  3 afib with controlled rate, + HTN, may need additional meds- back on home meds currently 4 coumadin- cont dosing 5 echo pending 6 K+ - replace 7 home later today if all agree  LOS: 2 days    GOLD,WAYNE E 05/11/2017  I have seen and examined the patient and agree with the assessment and plan as outlined.  Purcell Nailslarence H Owen, MD 05/11/2017 8:54 AM

## 2017-05-11 NOTE — Progress Notes (Signed)
  Echocardiogram 2D Echocardiogram has been performed.  Clifford King 05/11/2017, 1:19 PM

## 2017-05-11 NOTE — Progress Notes (Signed)
Discharge instructions printed and reviewed with patient and spouse, and copy given for them to take home. All questions addressed at this time. New prescriptions reviewed. IV and tele box removed. Room searched for patient belongings, and confirmed with patient that all valuables were accounted for. Spouse and staff assisted patient to dress prior to staff escorting patient to discharge via wheelchair.

## 2017-05-15 ENCOUNTER — Telehealth: Payer: Self-pay | Admitting: Cardiology

## 2017-05-15 MED FILL — Insulin Regular (Human) Inj 100 Unit/ML: INTRAMUSCULAR | Qty: 1 | Status: AC

## 2017-05-15 MED FILL — Sodium Chloride IV Soln 0.9%: INTRAVENOUS | Qty: 250 | Status: AC

## 2017-05-15 MED FILL — Phenylephrine HCl Inj 10 MG/ML: INTRAMUSCULAR | Qty: 2 | Status: AC

## 2017-05-15 NOTE — Telephone Encounter (Signed)
Pt requesting we go over medications from recent TAVR d/c - pt voiced understanding of d/c instructions regarding medications - has coumadin appt tomorrow 7/3 and f/u with Dr Diona BrownerMcDowell 7/23 Dr Copper 7/26

## 2017-05-15 NOTE — Telephone Encounter (Signed)
Needs help with medication list.  Is confused on what he needs to be taking since his surgery

## 2017-05-16 ENCOUNTER — Ambulatory Visit (INDEPENDENT_AMBULATORY_CARE_PROVIDER_SITE_OTHER): Payer: Medicare Other | Admitting: *Deleted

## 2017-05-16 DIAGNOSIS — I481 Persistent atrial fibrillation: Secondary | ICD-10-CM

## 2017-05-16 DIAGNOSIS — I25119 Atherosclerotic heart disease of native coronary artery with unspecified angina pectoris: Secondary | ICD-10-CM | POA: Diagnosis not present

## 2017-05-16 DIAGNOSIS — Z7901 Long term (current) use of anticoagulants: Secondary | ICD-10-CM

## 2017-05-16 DIAGNOSIS — I4891 Unspecified atrial fibrillation: Secondary | ICD-10-CM

## 2017-05-16 DIAGNOSIS — I4811 Longstanding persistent atrial fibrillation: Secondary | ICD-10-CM

## 2017-05-16 DIAGNOSIS — Z5181 Encounter for therapeutic drug level monitoring: Secondary | ICD-10-CM

## 2017-05-16 LAB — POCT INR: INR: 1.6

## 2017-05-25 ENCOUNTER — Ambulatory Visit (INDEPENDENT_AMBULATORY_CARE_PROVIDER_SITE_OTHER): Payer: Medicare Other | Admitting: *Deleted

## 2017-05-25 DIAGNOSIS — Z5181 Encounter for therapeutic drug level monitoring: Secondary | ICD-10-CM | POA: Diagnosis not present

## 2017-05-25 DIAGNOSIS — I25119 Atherosclerotic heart disease of native coronary artery with unspecified angina pectoris: Secondary | ICD-10-CM

## 2017-05-25 DIAGNOSIS — Z7901 Long term (current) use of anticoagulants: Secondary | ICD-10-CM

## 2017-05-25 DIAGNOSIS — I4891 Unspecified atrial fibrillation: Secondary | ICD-10-CM

## 2017-05-25 DIAGNOSIS — I481 Persistent atrial fibrillation: Secondary | ICD-10-CM

## 2017-05-25 DIAGNOSIS — I4811 Longstanding persistent atrial fibrillation: Secondary | ICD-10-CM

## 2017-05-25 LAB — POCT INR: INR: 2.5

## 2017-06-05 ENCOUNTER — Ambulatory Visit (INDEPENDENT_AMBULATORY_CARE_PROVIDER_SITE_OTHER): Payer: Medicare Other | Admitting: Cardiology

## 2017-06-05 ENCOUNTER — Ambulatory Visit: Payer: Medicare Other | Admitting: Cardiology

## 2017-06-05 ENCOUNTER — Encounter: Payer: Self-pay | Admitting: Cardiology

## 2017-06-05 VITALS — BP 147/74 | HR 85 | Ht 77.0 in | Wt 253.0 lb

## 2017-06-05 DIAGNOSIS — Z952 Presence of prosthetic heart valve: Secondary | ICD-10-CM | POA: Diagnosis not present

## 2017-06-05 DIAGNOSIS — I482 Chronic atrial fibrillation, unspecified: Secondary | ICD-10-CM

## 2017-06-05 DIAGNOSIS — I25119 Atherosclerotic heart disease of native coronary artery with unspecified angina pectoris: Secondary | ICD-10-CM | POA: Diagnosis not present

## 2017-06-05 DIAGNOSIS — I429 Cardiomyopathy, unspecified: Secondary | ICD-10-CM

## 2017-06-05 DIAGNOSIS — I209 Angina pectoris, unspecified: Secondary | ICD-10-CM | POA: Diagnosis not present

## 2017-06-05 NOTE — Progress Notes (Addendum)
Cardiology Office Note  Date: 06/05/2017   ID: Emile, Kyllo Apr 01, 1941, MRN [id number]  PCP: Chana Bode, DO  Primary Cardiologist: Nona Dell, MD   Chief Complaint  Patient presents with  . Aortic valve disease    History of Present Illness: ADAMS HINCH is a 76 y.o. male last seen in June. He is now status post TAVR performed on June 26 per Dr. Cornelius Moras and Dr. Excell Seltzer. He did well in the postoperative setting. Follow-up echocardiogram is detailed below. He presents today for follow-up, states that he feels much better, significant improvement in dyspnea on exertion. He has returned to walking regularly for exercise, has also lost some weight.  He continues on Coumadin with follow-up in the anticoagulation clinic. No reported bleeding problems.  I reviewed his medications which are outlined below. He does report taking Toprol-XL at 75 mg daily. Otherwise no other changes. He has some dizziness when he gets up in the morning, this goes away quickly. No palpitations or syncope.  Past Medical History:  Diagnosis Date  . Arthritis   . Atrial fibrillation (HCC)   . Carotid artery disease (HCC)    Nonobstructive  . Coronary atherosclerosis of native coronary artery    Multivessel, LVEF 60%, occluded small nondominant RCA  (not bypassed)  . Essential hypertension, benign   . LBBB (left bundle branch block)   . Mixed hyperlipidemia   . Morbid obesity (HCC)   . 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 Severe aortic stenosis     Past Surgical History:  Procedure Laterality Date  . CORONARY ARTERY BYPASS GRAFT  02/17/2003   Off-pump  LIMA to LAD, SVG to diagonal - Dr Tyrone Sage  . RIGHT/LEFT HEART CATH AND CORONARY ANGIOGRAPHY N/A 03/29/2017   Procedure: Right/Left Heart Cath and Coronary Angiography;  Surgeon: Tonny Bollman, MD;  Location[facility] INVASIVE CV LAB;  Service: Cardiovascular;  Laterality: N/A;  . Rotater cuff Left   . TEE WITHOUT CARDIOVERSION N/A 05/09/2017   Procedure: TRANSESOPHAGEAL ECHOCARDIOGRAM (TEE);  Surgeon: Tonny Bollman, MD;  Location: [facility];  Service: Open Heart Surgery;  Laterality: N/A;  . TRANSCATHETER AORTIC VALVE REPLACEMENT, TRANSFEMORAL N/A 05/09/2017   Procedure: TRANSCATHETER AORTIC VALVE REPLACEMENT, TRANSFEMORAL;  Surgeon: Tonny Bollman, MD;  Location[facility];  Service: Open Heart Surgery;  Laterality: N/A;  . VARICOSE VEIN SURGERY Right 1988    Current Outpatient Prescriptions  Medication Sig Dispense Refill  . allopurinol (ZYLOPRIM) 100 MG tablet Take 100 mg by mouth daily.      Marland Kitchen amLODipine (NORVASC) 10 MG tablet TAKE 1 TABLET BY MOUTH DAILY (Patient taking differently: TAKE 1 TABLET BY MOUTH DAILY IN THE EVENING) 90 tablet 3  . aspirin 81 MG tablet Take 81 mg by mouth daily.      . furosemide (LASIX) 40 MG tablet Take 1 tablet (40 mg total) by mouth daily. 30 tablet 1  . lisinopril (PRINIVIL,ZESTRIL) 20 MG tablet Take 20 mg by mouth daily.      . metoprolol succinate (TOPROL-XL) 50 MG 24 hr tablet Take 1 tablet (50 mg total) by mouth daily. Take 1  tablet daily 30 tablet  1  . Nystatin (NYAMYC) 100000 UNIT/GM POWD Apply topically daily as needed (infections).     . potassium chloride (K-DUR) 10 MEQ tablet Take 1 tablet (10 mEq total) by mouth daily. 30 tablet 1  . simvastatin (ZOCOR) 10 MG tablet TAKE 1 TABLET BY MOUTH AT BEDTIME 90 tablet 3  . traMADol (ULTRAM) 50 MG tablet Take 1-2 tablets (50-100 mg total) by mouth every 12 (twelve) hours as needed for moderate pain. 10 tablet 0  . warfarin (COUMADIN) 5 MG tablet TAKE AS DIRECTED BY COUMADIN CLINIC (Patient taking differently: Take  5-10 mg by mouth See admin instructions. Takes 5 mg (1 tab) on Monday and Friday, all other days (sun, tues, wed, thurs and Saturday) takes 2 (5mg ) tablets =10mg ) 180 tablet 0   No current facility-administered medications for this visit.    Allergies:  Vicodin [hydrocodone-acetaminophen]   Social History: The patient  reports that he quit smoking about 30 years ago. His smoking use included Cigars. He started smoking about 40 years ago. He has a 5.00 pack-year smoking history. He has never used smokeless tobacco. He reports that he does not drink alcohol or use drugs.   ROS:  Please see the history of present illness. Otherwise, complete review of systems is positive for chronic neck pain and limited range of motion.  All other systems are reviewed and negative.   Physical Exam: VS:  BP (!) 147/74   Pulse 85   Ht 6\' 5"  (1.956 m)   Wt 253 lb (114.8 kg)   BMI 30.00 kg/m , BMI Body mass index is 30 kg/m.  Wt Readings from Last 3 Encounters:  06/05/17 253 lb (114.8 kg)  05/10/17 254 lb (115.2 kg)  05/02/17 263 lb (119.3 kg)    General: Obese male, appearscomfortable at rest. HEENT: Conjunctiva and lids normal, oropharynx clear with moist mucosa. Neck: Supple, no elevated JVP or carotid bruits, no thyromegaly. Lungs: Clear to auscultation, nonlabored breathing at rest. Cardiac: Irregularly irregular, no S3, soft systolic murmur, no pericardial rub. Abdomen: Soft, nontender, bowel sounds present, no guarding or rebound. Extremities: No pitting edema, distal pulses 2+. Skin: Warm and dry. Musculoskeletal: No kyphosis. Neuropsychiatric: Alert and oriented 3, affect appropriate.  ECG: I personally reviewed the tracing from 05/10/2017 which showed probable atrial fibrillation at 71 bpm with left bundle branch block.  Recent Labwork: 05/02/2017: ALT 28; AST 29 05/10/2017: Magnesium 1.8 05/11/2017: BUN 12; Creatinine, Ser 0.87; Hemoglobin 13.4; Platelets 84; Potassium 3.4; Sodium 139       Component Value Date/Time   CHOL 122 08/20/2013 0859   TRIG 143 08/20/2013 0859   HDL 31 (L) 08/20/2013 0859   CHOLHDL 3.9 08/20/2013 0859   VLDL 29 08/20/2013 0859   LDLCALC 62 08/20/2013 0859    Other Studies Reviewed Today:  Echocardiogram 05/11/2017: Study Conclusions  - Left ventricle: LVEF is severely depressed . The cavity size was   moderately dilated. Wall thickness was increased in a pattern of   mild LVH. - Aortic valve: AV prosthesis is difficult to see Peak and mean   gradients through the valve are 14 and 8 mm Hg respectively. No   AI. - Mitral valve: There was mild regurgitation. - Left atrium: The atrium was severely dilated.  Impressions:  - Poor acoustic windows limit study .  Cardiac catheterization 03/29/2017: 1. Severe aortic stenosis with a mean transvalvular gradient of 43 mmHg and calculated AVA 0.92 square cm 2. Severe single vessel CAD with total occlusion of the proximal/ostial  LAD 3. S/P CABG with continued patency of the SVG-diagonal and LIMA-LAD 4. Elevated intracardiac pressures consistent with chronic combined systolic and diastolic heart failure  Case discussed with Dr Diona Browner. Will move forward with further evaluation for aortic valve replacement/TAVR for treatment of severe symptomatic aortic stenosis.  Cardiac CT 04/06/2017: IMPRESSION: The study quality is affected by motion artifact.  1. Trileaflet, severely thickened and calcified aortic valve with severely restricted leaflet opening and minimal calcifications extending into the LVOT. Annular measurements suitable for delivery of a 29 mm Edwards-SAPIEN 3 valve.  2. Sufficient annulus to coronary distance.  3. Optimum Fluoroscopic Angle for Delivery: LAO 2 CAU 3  4. No thrombus in the left atrial appendage.  Assessment and Plan:  1. Severe aortic stenosis status post recent TAVR and doing well with significant improvement in symptoms. Follow-up echocardiogram is  noted above. Keep follow-up with Dr. Excell Seltzer as scheduled.  2. Chronic atrial fibrillation, remains on Coumadin for stroke prophylaxis and Toprol-XL for heart rate control.  3. CAD status post CABG in 2004, patent LIMA to LAD and SVG to diagonal at recent cardiac catheterization with small, nondominant RCA that is occluded. He reports no angina at this time.  4. Cardiomyopathy, LVEF 45% by prior assessment and described as being "severely depressed" by echocardiogram in June. He is scheduled for a follow-up echocardiogram this week for Dr. Earmon Phoenix visit.  Current medicines were reviewed with the patient today.  Disposition: Follow-up in 6 months.  Signed, Jonelle Sidle, MD, [facility] 06/05/2017 12:33 PM    [facility] [address] [place], [place], [place] [address] Phone: [phone]; Fax: [phone]

## 2017-06-05 NOTE — Patient Instructions (Signed)

## 2017-06-06 ENCOUNTER — Other Ambulatory Visit: Payer: Self-pay | Admitting: *Deleted

## 2017-06-06 ENCOUNTER — Other Ambulatory Visit: Payer: Self-pay | Admitting: Physician Assistant

## 2017-06-06 MED ORDER — WARFARIN SODIUM 5 MG PO TABS: ORAL_TABLET | ORAL | 3 refills | Status: DC

## 2017-06-06 MED ORDER — SIMVASTATIN 10 MG PO TABS: 10.0000 mg | ORAL_TABLET | Freq: Every day | ORAL | 3 refills | Status: AC

## 2017-06-06 NOTE — Telephone Encounter (Signed)
This is Dr. McDowell's pt 

## 2017-06-07 ENCOUNTER — Encounter: Payer: Self-pay | Admitting: *Deleted

## 2017-06-07 NOTE — Progress Notes (Signed)
This encounter was created in error - please disregard.

## 2017-06-08 ENCOUNTER — Ambulatory Visit (INDEPENDENT_AMBULATORY_CARE_PROVIDER_SITE_OTHER): Payer: Medicare Other | Admitting: Cardiovascular Disease

## 2017-06-08 ENCOUNTER — Other Ambulatory Visit: Payer: Self-pay

## 2017-06-08 ENCOUNTER — Ambulatory Visit (HOSPITAL_COMMUNITY): Payer: Medicare Other | Attending: Cardiology

## 2017-06-08 ENCOUNTER — Encounter: Payer: Self-pay | Admitting: Cardiovascular Disease

## 2017-06-08 VITALS — BP 114/56 | HR 66 | Ht 77.0 in | Wt 251.8 lb

## 2017-06-08 DIAGNOSIS — I350 Nonrheumatic aortic (valve) stenosis: Secondary | ICD-10-CM

## 2017-06-08 DIAGNOSIS — Z953 Presence of xenogenic heart valve: Secondary | ICD-10-CM | POA: Diagnosis not present

## 2017-06-08 DIAGNOSIS — E785 Hyperlipidemia, unspecified: Secondary | ICD-10-CM | POA: Insufficient documentation

## 2017-06-08 DIAGNOSIS — I2510 Atherosclerotic heart disease of native coronary artery without angina pectoris: Secondary | ICD-10-CM | POA: Diagnosis not present

## 2017-06-08 DIAGNOSIS — Z952 Presence of prosthetic heart valve: Secondary | ICD-10-CM

## 2017-06-08 DIAGNOSIS — I25119 Atherosclerotic heart disease of native coronary artery with unspecified angina pectoris: Secondary | ICD-10-CM

## 2017-06-08 DIAGNOSIS — I447 Left bundle-branch block, unspecified: Secondary | ICD-10-CM | POA: Diagnosis not present

## 2017-06-08 DIAGNOSIS — I340 Nonrheumatic mitral (valve) insufficiency: Secondary | ICD-10-CM | POA: Insufficient documentation

## 2017-06-08 DIAGNOSIS — I4891 Unspecified atrial fibrillation: Secondary | ICD-10-CM | POA: Diagnosis not present

## 2017-06-08 DIAGNOSIS — I119 Hypertensive heart disease without heart failure: Secondary | ICD-10-CM | POA: Diagnosis not present

## 2017-06-08 DIAGNOSIS — R29898 Other symptoms and signs involving the musculoskeletal system: Secondary | ICD-10-CM | POA: Diagnosis not present

## 2017-06-08 NOTE — Patient Instructions (Addendum)
Medication Instructions:  Your physician recommends that you continue on your current medications as directed. Please refer to the Current Medication list given to you today.  Labwork: No new orders.   Testing/Procedures: Your physician has requested that you have an echocardiogram in 1 YEAR. Echocardiography is a painless test that uses sound waves to create images of your heart. It provides your doctor with information about the size and shape of your heart and how well your heart's chambers and valves are working. This procedure takes approximately one hour. There are no restrictions for this procedure.  Follow-Up: Your physician wants you to follow-up in: 1 YEAR with TAVR clinic. You will receive a reminder letter in the mail two months in advance. If you don't receive a letter, please call our office to schedule the follow-up appointment.   Any Other Special Instructions Will Be Listed Below (If Applicable).  Your physician discussed the importance of taking an antibiotic prior to any dental, gastrointestinal, genitourinary procedures to prevent damage to the heart valves from infection.   If you need a refill on your cardiac medications before your next appointment, please call your pharmacy.

## 2017-06-08 NOTE — Progress Notes (Signed)
Cardiology Office Note Date:  06/08/2017   ID:  Clifford ShamWilliam T Ault, DOB [birth year], MRN [id number]  PCP:  Chana BodeFatade, Ayokunle, DO  Cardiologist:  Tonny Bollmanooper, Babbette Dalesandro, MD    Chief Complaint  Patient presents with  . Aortic Stenosis    s/p TAVR     History of Present Illness: Clifford King is a 76 y.o. male who presents for TAVR follow-up. He underwent TAVR 05/09/2017 via a percutaneous transfemoral approach, treated with a 29 mm Sapien 3 valve. The patient's postoperative course was uncomplicated and he was discharged home on postoperative day #2.  The patient is here today with his wife. He is doing well. States his breathing is improved and he is able to walk longer than he could before. Denies chest pain or pressure, orthopnea, PND, lightheadedness, or heart palpitations.   Past Medical History:  Diagnosis Date  . Arthritis   . Atrial fibrillation (HCC)   . Carotid artery disease (HCC)    Nonobstructive  . Coronary atherosclerosis of native coronary artery    Multivessel, LVEF 60%, occluded small nondominant RCA  (not bypassed)  . Essential hypertension, benign   . LBBB (left bundle branch block)   . Mixed hyperlipidemia   . Morbid obesity (HCC)   . 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 Severe aortic stenosis     Past Surgical History:  Procedure Laterality Date  . CORONARY ARTERY BYPASS GRAFT  02/17/2003   Off-pump LIMA to LAD, SVG to diagonal - Dr Tyrone SageGerhardt  . RIGHT/LEFT HEART CATH AND CORONARY ANGIOGRAPHY N/A 03/29/2017   Procedure: Right/Left Heart Cath and Coronary Angiography;  Surgeon: Tonny Bollmanooper, Somnang Mahan, MD;  [facility];   Service: Cardiovascular;  Laterality: N/A;  . Rotater cuff Left   . TEE WITHOUT CARDIOVERSION N/A 05/09/2017   Procedure: TRANSESOPHAGEAL ECHOCARDIOGRAM (TEE);  Surgeon: Tonny Bollmanooper, Taziah Difatta, MD;  [facility];  Service: Open Heart Surgery;  Laterality: N/A;  . TRANSCATHETER AORTIC VALVE REPLACEMENT, TRANSFEMORAL N/A 05/09/2017   Procedure: TRANSCATHETER AORTIC VALVE REPLACEMENT, TRANSFEMORAL;  Surgeon: Tonny Bollmanooper, Angelos Wasco, MD;  [facility];  Service: Open Heart Surgery;  Laterality: N/A;  . VARICOSE VEIN SURGERY Right 1988    Current Outpatient Prescriptions  Medication Sig Dispense Refill  . allopurinol (ZYLOPRIM) 100 MG tablet Take 100 mg by mouth daily.      Marland Kitchen. amLODipine (NORVASC) 10 MG tablet Take 10 mg by mouth every evening.    Marland Kitchen. aspirin 81 MG tablet Take 81 mg by mouth daily.      . furosemide (LASIX) 40 MG tablet Take 1 tablet (40 mg total) by mouth daily. 30 tablet 1  . lisinopril (PRINIVIL,ZESTRIL) 20 MG tablet Take 20 mg by mouth daily.      . metoprolol succinate (TOPROL-XL) 50 MG 24 hr tablet Take 1 tablet (50 mg total) by mouth daily. Take 1  tablet daily 30 tablet 1  . Nystatin (NYAMYC) 100000 UNIT/GM POWD Apply topically daily as needed (infections).     . potassium chloride (K-DUR) 10 MEQ tablet TAKE 1 TABLET BY MOUTH DAILY 90 tablet 3  . simvastatin (ZOCOR) 10 MG tablet Take 1 tablet (10  mg total) by mouth at bedtime. 90 tablet 3  . traMADol (ULTRAM) 50 MG tablet Take 1-2 tablets (50-100 mg total) by mouth every 12 (twelve) hours as needed for moderate pain. 10 tablet 0  . warfarin (COUMADIN) 5 MG tablet Take 2 tablets daily except 1 tablet on Mondays and Fridays 180 tablet 3   No current facility-administered medications for this visit.     Allergies:   Vicodin [hydrocodone-acetaminophen]   Social History:  The patient  reports that he quit smoking about 30 years ago. His smoking use included Cigars. He started smoking about 40 years ago. He has a 5.00 pack-year smoking  history. He has never used smokeless tobacco. He reports that he does not drink alcohol or use drugs.   Family History:  The patient's  family history includes Cancer in his other; Stroke in his other.    ROS:  Please see the history of present illness.  Otherwise, review of systems is positive for excessive sweating.  All other systems are reviewed and negative.    PHYSICAL EXAM: VS:  BP (!) 114/56   Pulse 66   Ht 6\' 5"  (1.956 m)   Wt 251 lb 12.8 oz (114.2 kg)   BMI 29.86 kg/m  , BMI Body mass index is 29.86 kg/m. GEN: Well nourished, well developed, overweight elderly gentleman in no acute distress  HEENT: normal  Neck: no JVD, no masses. No carotid bruits Cardiac: irregularly irregular with 2/6 SEM at the RUSB, no diastolic murmur              Respiratory:  clear to auscultation bilaterally, normal work of breathing GI: soft, nontender, nondistended, + BS MS: no deformity or atrophy  Ext: no pretibial edema, pedal pulses 2+= bilaterally, BL groin sites nontender Skin: warm and dry, no rash Neuro:  Strength and sensation are intact Psych: euthymic mood, full affect  EKG:  EKG is ordered today. The ekg ordered today shows atrial fibrillation 61 bpm, LBBB, no change from previous  Recent Labs: 05/02/2017: ALT 28 05/10/2017: Magnesium 1.8 05/11/2017: BUN 12; Creatinine, Ser 0.87; Hemoglobin 13.4; Platelets 84; Potassium 3.4; Sodium 139   Lipid Panel     Component Value Date/Time   CHOL 122 08/20/2013 0859   TRIG 143 08/20/2013 0859   HDL 31 (L) 08/20/2013 0859   CHOLHDL 3.9 08/20/2013 0859   VLDL 29 08/20/2013 0859   LDLCALC 62 08/20/2013 0859      Wt Readings from Last 3 Encounters:  06/08/17 251 lb 12.8 oz (114.2 kg)  06/05/17 253 lb (114.8 kg)  05/10/17 254 lb (115.2 kg)     Cardiac Studies Reviewed: 2D Echo today: Study Conclusions  - Left ventricle: The cavity size was moderately dilated. Systolic   function was mildly to moderately reduced. The estimated  ejection   fraction was in the range of 40% to 45%. Mild diffuse   hypokinesis. There is akinesis of the apical septal and apical   inferior myocardium. There is akinesis of the apicalinferior   myocardium. Doppler parameters are consistent with high   ventricular filling pressure. - Aortic valve: S/P TAVR well seated and functioning normally. The   mean AV gradient is and the peak AV gradient is .   There is no evidence of perivalvular leak. Mean gradient (S): 11   mm Hg. Peak gradient (S): 18 mm Hg. - Mitral valve: There was mild regurgitation. - Left atrium: The atrium was severely dilated. - Right ventricle: Systolic function was mildly  reduced. - Right atrium: The atrium was mildly dilated. - Pulmonary arteries: PA peak pressure: 48 mm Hg (S).  Impressions:  - Compared to last echo, TAVR is now present. LVF remains mildly   reduced at 40-45%. The right ventricular systolic pressure was   increased consistent with moderate pulmonary hypertension.  ASSESSMENT AND PLAN: 1. Aortic valve disease s/p TAVR: The patient has done very well with TAVR. His echo from today is reviewed and demonstrates mild LV dysfunction with an LVEF of 40-45% and normal function of his transcatheter heart valve with a mean gradient of 11 mmHg and no significant paravalvular regurgitation. Medications are reviewed and no changes are recommended. He should return in one year for a routine valve clinic visit with an echocardiogram at that time.  2. Chronic combined systolic and diastolic heart failure: Patient with New York Heart Association functional class II symptoms. Medications are reviewed and will be continued. Management per Dr. Diona BrownerMcDowell.  3. Permanent atrial fibrillation: Heart rate is well controlled. He is tolerating anticoagulation with warfarin.  Current medicines are reviewed with the patient today.  The patient does not have concerns regarding medicines.  Labs/ tests ordered today  include:  No orders of the defined types were placed in this encounter.   Disposition:   FU one year Valve Clinic visit per protocol. Continue regular FU with Dr Margarito LinerMcDowell  Signed, Tonny Bollmanooper, Chermaine Schnyder, MD  06/08/2017 11:19 AM    [facility] [address], [place], [place]  [address] Phone: [phone](336) [phone]; Fax: [phone](336) [phone]

## 2017-06-22 ENCOUNTER — Ambulatory Visit (INDEPENDENT_AMBULATORY_CARE_PROVIDER_SITE_OTHER): Payer: Medicare Other | Admitting: *Deleted

## 2017-06-22 DIAGNOSIS — I481 Persistent atrial fibrillation: Secondary | ICD-10-CM

## 2017-06-22 DIAGNOSIS — Z7901 Long term (current) use of anticoagulants: Secondary | ICD-10-CM

## 2017-06-22 DIAGNOSIS — Z5181 Encounter for therapeutic drug level monitoring: Secondary | ICD-10-CM | POA: Diagnosis not present

## 2017-06-22 DIAGNOSIS — I25119 Atherosclerotic heart disease of native coronary artery with unspecified angina pectoris: Secondary | ICD-10-CM | POA: Diagnosis not present

## 2017-06-22 DIAGNOSIS — I4811 Longstanding persistent atrial fibrillation: Secondary | ICD-10-CM

## 2017-06-22 DIAGNOSIS — I4891 Unspecified atrial fibrillation: Secondary | ICD-10-CM

## 2017-06-22 LAB — POCT INR: INR: 1.8

## 2017-07-06 ENCOUNTER — Ambulatory Visit (INDEPENDENT_AMBULATORY_CARE_PROVIDER_SITE_OTHER): Payer: Medicare Other | Admitting: *Deleted

## 2017-07-06 DIAGNOSIS — I4891 Unspecified atrial fibrillation: Secondary | ICD-10-CM | POA: Diagnosis not present

## 2017-07-06 DIAGNOSIS — I25119 Atherosclerotic heart disease of native coronary artery with unspecified angina pectoris: Secondary | ICD-10-CM | POA: Diagnosis not present

## 2017-07-06 DIAGNOSIS — I4811 Longstanding persistent atrial fibrillation: Secondary | ICD-10-CM

## 2017-07-06 DIAGNOSIS — I481 Persistent atrial fibrillation: Secondary | ICD-10-CM | POA: Diagnosis not present

## 2017-07-06 DIAGNOSIS — Z7901 Long term (current) use of anticoagulants: Secondary | ICD-10-CM

## 2017-07-06 DIAGNOSIS — Z5181 Encounter for therapeutic drug level monitoring: Secondary | ICD-10-CM | POA: Diagnosis not present

## 2017-07-06 LAB — POCT INR: INR: 1.6

## 2017-07-06 MED ORDER — WARFARIN SODIUM 5 MG PO TABS: ORAL_TABLET | ORAL | 3 refills | Status: DC

## 2017-07-20 ENCOUNTER — Ambulatory Visit (INDEPENDENT_AMBULATORY_CARE_PROVIDER_SITE_OTHER): Payer: Medicare Other | Admitting: *Deleted

## 2017-07-20 DIAGNOSIS — Z7901 Long term (current) use of anticoagulants: Secondary | ICD-10-CM | POA: Diagnosis not present

## 2017-07-20 DIAGNOSIS — Z5181 Encounter for therapeutic drug level monitoring: Secondary | ICD-10-CM

## 2017-07-20 DIAGNOSIS — I4891 Unspecified atrial fibrillation: Secondary | ICD-10-CM | POA: Diagnosis not present

## 2017-07-20 DIAGNOSIS — I4811 Longstanding persistent atrial fibrillation: Secondary | ICD-10-CM

## 2017-07-20 LAB — POCT INR: INR: 1.5

## 2017-08-03 ENCOUNTER — Ambulatory Visit (INDEPENDENT_AMBULATORY_CARE_PROVIDER_SITE_OTHER): Payer: Medicare Other | Admitting: *Deleted

## 2017-08-03 DIAGNOSIS — Z5181 Encounter for therapeutic drug level monitoring: Secondary | ICD-10-CM

## 2017-08-03 DIAGNOSIS — Z7901 Long term (current) use of anticoagulants: Secondary | ICD-10-CM

## 2017-08-03 DIAGNOSIS — I4891 Unspecified atrial fibrillation: Secondary | ICD-10-CM | POA: Diagnosis not present

## 2017-08-03 DIAGNOSIS — I4811 Longstanding persistent atrial fibrillation: Secondary | ICD-10-CM

## 2017-08-03 LAB — POCT INR: INR: 1.8

## 2017-08-29 ENCOUNTER — Ambulatory Visit (INDEPENDENT_AMBULATORY_CARE_PROVIDER_SITE_OTHER): Payer: Medicare Other | Admitting: *Deleted

## 2017-08-29 DIAGNOSIS — Z7901 Long term (current) use of anticoagulants: Secondary | ICD-10-CM

## 2017-08-29 DIAGNOSIS — I4811 Longstanding persistent atrial fibrillation: Secondary | ICD-10-CM

## 2017-08-29 DIAGNOSIS — Z5181 Encounter for therapeutic drug level monitoring: Secondary | ICD-10-CM

## 2017-08-29 DIAGNOSIS — I4891 Unspecified atrial fibrillation: Secondary | ICD-10-CM | POA: Diagnosis not present

## 2017-08-29 LAB — POCT INR: INR: 4.4

## 2017-08-30 ENCOUNTER — Other Ambulatory Visit: Payer: Self-pay | Admitting: Cardiology

## 2017-09-06 ENCOUNTER — Other Ambulatory Visit: Payer: Self-pay | Admitting: Cardiology

## 2017-09-14 ENCOUNTER — Ambulatory Visit (INDEPENDENT_AMBULATORY_CARE_PROVIDER_SITE_OTHER): Payer: Medicare Other | Admitting: *Deleted

## 2017-09-14 DIAGNOSIS — I4811 Longstanding persistent atrial fibrillation: Secondary | ICD-10-CM

## 2017-09-14 DIAGNOSIS — Z5181 Encounter for therapeutic drug level monitoring: Secondary | ICD-10-CM

## 2017-09-14 DIAGNOSIS — I350 Nonrheumatic aortic (valve) stenosis: Secondary | ICD-10-CM | POA: Diagnosis not present

## 2017-09-14 DIAGNOSIS — I481 Persistent atrial fibrillation: Secondary | ICD-10-CM | POA: Diagnosis not present

## 2017-09-14 DIAGNOSIS — Z952 Presence of prosthetic heart valve: Secondary | ICD-10-CM | POA: Diagnosis not present

## 2017-09-14 LAB — POCT INR: INR: 2.2

## 2017-10-12 ENCOUNTER — Ambulatory Visit (INDEPENDENT_AMBULATORY_CARE_PROVIDER_SITE_OTHER): Payer: Medicare Other | Admitting: *Deleted

## 2017-10-12 DIAGNOSIS — Z5181 Encounter for therapeutic drug level monitoring: Secondary | ICD-10-CM | POA: Diagnosis not present

## 2017-10-12 DIAGNOSIS — I481 Persistent atrial fibrillation: Secondary | ICD-10-CM

## 2017-10-12 DIAGNOSIS — I4811 Longstanding persistent atrial fibrillation: Secondary | ICD-10-CM

## 2017-10-12 LAB — POCT INR: INR: 3

## 2017-11-16 ENCOUNTER — Ambulatory Visit (INDEPENDENT_AMBULATORY_CARE_PROVIDER_SITE_OTHER): Payer: Medicare Other | Admitting: *Deleted

## 2017-11-16 DIAGNOSIS — I481 Persistent atrial fibrillation: Secondary | ICD-10-CM

## 2017-11-16 DIAGNOSIS — Z5181 Encounter for therapeutic drug level monitoring: Secondary | ICD-10-CM | POA: Diagnosis not present

## 2017-11-16 DIAGNOSIS — Z952 Presence of prosthetic heart valve: Secondary | ICD-10-CM

## 2017-11-16 DIAGNOSIS — I4811 Longstanding persistent atrial fibrillation: Secondary | ICD-10-CM

## 2017-11-16 LAB — POCT INR: INR: 2.4

## 2017-11-16 NOTE — Patient Instructions (Signed)
S/P TAVR on 6/26 Continue coumadin 2 tablets daily except 1 tablet on Wednesdays  Recheck in 6 wks

## 2017-11-21 ENCOUNTER — Other Ambulatory Visit: Payer: Self-pay | Admitting: *Deleted

## 2017-11-21 MED ORDER — METOPROLOL SUCCINATE ER 50 MG PO TB24: ORAL_TABLET | ORAL | 3 refills | Status: DC

## 2017-12-01 NOTE — Progress Notes (Signed)
Cardiology Office Note  Date: 12/04/2017   ID: Clifford King, DOB 1940-12-26, MRN [id number]  PCP: Chana Bode, DO  Primary Cardiologist: Nona Dell, MD   Chief Complaint  Patient presents with  . Aortic valve disease  . Coronary Artery Disease    History of Present Illness: Clifford King is a 77 y.o. male last seen in July 2018. He presents for a routine follow-up visit. Since last evaluation he continues to do fairly well. He reports walking for exercise on a regular basis, generally NYHA class II dyspnea. No exertional chest pain or palpitations.  He remains on Coumadin with follow-up in the anticoagulation clinic. Recent INR was 2.4. He reports no bleeding problems.  Follow-up echocardiogram in July 2018 showed LVEF 40-45% range with stable aortic prosthesis, PASP 48 mmHg.  I reviewed his medications which are outlined below. We discussed his Toprol-XL dose, he feels like he has been somewhat more limited since we increased this previously. We will try to go back to 50 mg daily.  Past Medical History:  Diagnosis Date  . Arthritis   . Atrial fibrillation (HCC)   . Carotid artery disease (HCC)    Nonobstructive  . Coronary atherosclerosis of native coronary artery    Multivessel, LVEF 60%, occluded small nondominant RCA  (not bypassed)  . Essential hypertension, benign   . LBBB (left bundle branch block)   . Mixed hyperlipidemia   . Morbid obesity (HCC)   . 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 Severe aortic stenosis     Past Surgical History:  Procedure Laterality Date  . CORONARY ARTERY BYPASS GRAFT  02/17/2003   Off-pump LIMA to LAD, SVG to diagonal - Dr Tyrone Sage  . RIGHT/LEFT HEART CATH AND CORONARY ANGIOGRAPHY N/A 03/29/2017   Procedure: Right/Left Heart Cath and Coronary Angiography;  Surgeon: Tonny Bollman, MD;  Location: [facility] INVASIVE CV LAB;  Service: Cardiovascular;  Laterality: N/A;  . Rotater cuff Left   . TEE WITHOUT CARDIOVERSION N/A 05/09/2017   Procedure: TRANSESOPHAGEAL ECHOCARDIOGRAM (TEE);  Surgeon: Tonny Bollman, MD;  Location: [facility] OR;  Service: Open Heart Surgery;  Laterality: N/A;  . TRANSCATHETER AORTIC VALVE REPLACEMENT, TRANSFEMORAL N/A 05/09/2017   Procedure: TRANSCATHETER AORTIC VALVE REPLACEMENT, TRANSFEMORAL;  Surgeon: Tonny Bollman, MD;  Location: [facility] OR;  Service: Open Heart Surgery;  Laterality: N/A;  . VARICOSE VEIN SURGERY Right 1988    Current Outpatient Medications  Medication Sig Dispense Refill  . allopurinol (ZYLOPRIM) 100 MG tablet Take 100 mg by mouth daily.      Marland Kitchen amLODipine (NORVASC) 10 MG tablet Take 10 mg by mouth every evening.    Marland Kitchen aspirin 81 MG tablet Take 81 mg by mouth daily.      . furosemide (LASIX) 40 MG tablet Take 1 tablet (40 mg total) by mouth daily. 30 tablet 1  . lisinopril (PRINIVIL,ZESTRIL) 20 MG tablet Take 20 mg by mouth daily.      Marland Kitchen Nystatin (NYAMYC) 100000 UNIT/GM POWD Apply topically daily as needed (infections).     . potassium chloride (K-DUR) 10 MEQ tablet TAKE 1 TABLET BY MOUTH DAILY 90 tablet 3  . simvastatin (  ZOCOR) 10 MG tablet Take 1 tablet (10 mg total) by mouth at bedtime. 90 tablet 3  . warfarin (COUMADIN) 5 MG tablet Take 2 tablets daily except 1 tablet on Mondays 180 tablet 3  . metoprolol succinate (TOPROL-XL) 50 MG 24 hr tablet Take 1 tablet (50 mg total) by mouth daily. Take with or immediately following a meal. 90 tablet 3   No current facility-administered medications for this visit.    Allergies:  Vicodin [hydrocodone-acetaminophen]   Social History: The patient  reports that he quit smoking about 31 years ago. His  smoking use included cigars. He started smoking about 41 years ago. He has a 5.00 pack-year smoking history. he has never used smokeless tobacco. He reports that he does not drink alcohol or use drugs.   ROS:  Please see the history of present illness. Otherwise, complete review of systems is positive for arthritic stiffness.  All other systems are reviewed and negative.   Physical Exam: VS:  BP 140/70   Pulse (!) 58   Ht 6\' 5"  (1.956 m)   Wt 268 lb (121.6 kg)   SpO2 98%   BMI 31.78 kg/m , BMI Body mass index is 31.78 kg/m.  Wt Readings from Last 3 Encounters:  12/04/17 268 lb (121.6 kg)  06/08/17 251 lb 12.8 oz (114.2 kg)  06/05/17 253 lb (114.8 kg)    General: Obese male, appears comfortable at rest. HEENT: Conjunctiva and lids normal, oropharynx clear. Neck: Supple, no elevated JVP or carotid bruits, no thyromegaly. Lungs: Clear to auscultation, nonlabored breathing at rest. Cardiac: Irregularly irregular, no S3, soft systolic murmur. Abdomen: Soft, nontender, bowel sounds present, no guarding or rebound. Extremities: No pitting edema, distal pulses 2+. Skin: Warm and dry. Musculoskeletal: No kyphosis. Neuropsychiatric: Alert and oriented x3, affect grossly appropriate.  ECG: I personally reviewed the tracing from 06/08/2017 which showed rate-controlled atrial fibrillation with left bundle branch block.  Recent Labwork: 05/02/2017: ALT 28; AST 29 05/10/2017: Magnesium 1.8 05/11/2017: BUN 12; Creatinine, Ser 0.87; Hemoglobin 13.4; Platelets 84; Potassium 3.4; Sodium 139     Component Value Date/Time   CHOL 122 08/20/2013 0859   TRIG 143 08/20/2013 0859   HDL 31 (L) 08/20/2013 0859   CHOLHDL 3.9 08/20/2013 0859   VLDL 29 08/20/2013 0859   LDLCALC 62 08/20/2013 0859    Other Studies Reviewed Today:  Echocardiogram 06/08/2017: Study Conclusions  - Left ventricle: The cavity size was moderately dilated. Systolic   function was mildly to moderately reduced. The estimated  ejection   fraction was in the range of 40% to 45%. Mild diffuse   hypokinesis. There is akinesis of the apical septal and apical   inferior myocardium. There is akinesis of the apicalinferior   myocardium. Doppler parameters are consistent with high   ventricular filling pressure. - Aortic valve: S/P TAVR well seated and functioning normally. The   mean AV gradient is 11mmHg and the peak AV gradient is 18mmHg.   There is no evidence of perivalvular leak. Mean gradient (S): 11   mm Hg. Peak gradient (S): 18 mm Hg. - Mitral valve: There was mild regurgitation. - Left atrium: The atrium was severely dilated. - Right ventricle: Systolic function was mildly reduced. - Right atrium: The atrium was mildly dilated. - Pulmonary arteries: PA peak pressure: 48 mm Hg (S).  Impressions:  - Compared to last echo, TAVR is now present. LVF remains mildly   reduced at 40-45%. The right ventricular systolic pressure was   increased consistent  with moderate pulmonary hypertension.  Assessment and Plan:  1. Severe aortic stenosis status post TAVR. He is doing well at this time. Anticipate follow-up echocardiogram this summer when he sees Dr. Excell Seltzer.  2. Chronic atrial fibrillation. Continue Coumadin and reduce Toprol-XL back to 50 mg daily for now to see if this improves his stamina at all.  3. CAD status post CABG in 2004 with patent LIMA to the LAD and SVG to diagonal during TAVR workup. He has a small nondominant RCA that is occluded to being managed medically.  4. Cardiomyopathy, LVEF 40-45%. No change in current regimen.  Current medicines were reviewed with the patient today.  Disposition: Follow-up in 6 months.  Signed, Jonelle Sidle, MD, [facility] 12/04/2017 11:56 AM    [facility] [address] [place], [place], [place] [address] Phone: [phone]; Fax: [phone]

## 2017-12-04 ENCOUNTER — Ambulatory Visit (INDEPENDENT_AMBULATORY_CARE_PROVIDER_SITE_OTHER): Payer: Medicare Other | Admitting: Cardiology

## 2017-12-04 ENCOUNTER — Encounter: Payer: Self-pay | Admitting: Cardiology

## 2017-12-04 VITALS — BP 140/70 | HR 58 | Ht 77.0 in | Wt 268.0 lb

## 2017-12-04 DIAGNOSIS — I209 Angina pectoris, unspecified: Secondary | ICD-10-CM | POA: Diagnosis not present

## 2017-12-04 DIAGNOSIS — I429 Cardiomyopathy, unspecified: Secondary | ICD-10-CM

## 2017-12-04 DIAGNOSIS — I25119 Atherosclerotic heart disease of native coronary artery with unspecified angina pectoris: Secondary | ICD-10-CM | POA: Diagnosis not present

## 2017-12-04 DIAGNOSIS — Z952 Presence of prosthetic heart valve: Secondary | ICD-10-CM

## 2017-12-04 DIAGNOSIS — I482 Chronic atrial fibrillation, unspecified: Secondary | ICD-10-CM

## 2017-12-04 MED ORDER — METOPROLOL SUCCINATE ER 50 MG PO TB24: 50.0000 mg | ORAL_TABLET | Freq: Every day | ORAL | 3 refills | Status: DC

## 2017-12-04 NOTE — Patient Instructions (Signed)
Medication Instructions:  Your physician has recommended you make the following change in your medication:   DECREASE Toprol to 50 mg daily  Please continue all other medications as prescribed   Labwork: NONE  Testing/Procedures: NONE  Follow-Up: Your physician wants you to follow-up in: 6 MONTHS WITH DR. MCDOWELL. You will receive a reminder letter in the mail two months in advance. If you don't receive a letter, please call our office to schedule the follow-up appointment.  Any Other Special Instructions Will Be Listed Below (If Applicable).  If you need a refill on your cardiac medications before your next appointment, please call your pharmacy.

## 2017-12-21 ENCOUNTER — Telehealth: Payer: Self-pay | Admitting: *Deleted

## 2017-12-21 NOTE — Telephone Encounter (Signed)
Mr. Harvin Hazelhurston called requesting to speak with Vashti HeyLisa Reid, RN in regards to his coumdin . Please call [phone]

## 2017-12-21 NOTE — Telephone Encounter (Signed)
Pt states he went to the ED.  Having trouble with his memory.  Was given Rx for Thiamine and B12 1000mg .  Told pt these 2 vitamins would not interfere with his coumadin.  Has INR f/u on 01/02/18.

## 2018-01-02 ENCOUNTER — Ambulatory Visit (INDEPENDENT_AMBULATORY_CARE_PROVIDER_SITE_OTHER): Payer: Medicare Other | Admitting: *Deleted

## 2018-01-02 DIAGNOSIS — Z952 Presence of prosthetic heart valve: Secondary | ICD-10-CM

## 2018-01-02 DIAGNOSIS — I481 Persistent atrial fibrillation: Secondary | ICD-10-CM | POA: Diagnosis not present

## 2018-01-02 DIAGNOSIS — I4811 Longstanding persistent atrial fibrillation: Secondary | ICD-10-CM

## 2018-01-02 DIAGNOSIS — Z5181 Encounter for therapeutic drug level monitoring: Secondary | ICD-10-CM | POA: Diagnosis not present

## 2018-01-02 LAB — POCT INR: INR: 1.8

## 2018-01-02 NOTE — Patient Instructions (Signed)
S/P TAVR on 6/26 Take coumadin 3 tablets tonight then resume 2 tablets daily except 1 tablet on Wednesdays  Recheck in 4 wks

## 2018-03-02 ENCOUNTER — Telehealth: Payer: Self-pay | Admitting: *Deleted

## 2018-03-02 NOTE — Telephone Encounter (Signed)
Wife called to inform office that patient has been placed in [facility] in [place] and she doesn't know if he will be able to come back to our office due to his mental status and memory problems.

## 2018-03-16 ENCOUNTER — Telehealth: Payer: Self-pay

## 2018-03-16 DIAGNOSIS — Z952 Presence of prosthetic heart valve: Secondary | ICD-10-CM

## 2018-03-16 NOTE — Telephone Encounter (Signed)
TAVR completed 05/09/17.  Called to arrange 1 year TAVR echo and visit with K. Janee Morn, [place]. Mailbox is full- could not leave message.  Will try again later.

## 2018-03-20 NOTE — Telephone Encounter (Signed)
Attempted to call every number on file. Patient's numbers went to VM that were full - unable to leave message. EC's phone was busy several calls.  Will try again later.

## 2018-03-27 NOTE — Telephone Encounter (Addendum)
Attempted to call patient. The woman who answered the phone said to call next week - the patient is in [place] hospital with an infection.

## 2018-04-16 NOTE — Telephone Encounter (Signed)
Scheduled patient for 1 year TAVR echo and visit with Carlean JewsKatie Thompson, PA on 05/30/18. Patient's wife understands to arrive at 1230.  Letter mailed with appointments per request.

## 2018-05-15 IMAGING — CT CT ANGIO CHEST
1 of 7 series · 2 of 36 positions shown · IV contrast (OMNI 350)
Comparison: None.

CLINICAL DATA: 75-year-old male with history of severe aortic
stenosis. Increasing shortness of breath on exertion for the past
3-4 months. Preprocedural study prior to potential transcatheter
aortic valve replacement (TAVR) procedure.

EXAM:
CT ANGIOGRAPHY CHEST, ABDOMEN AND PELVIS
TECHNIQUE: Multidetector CT imaging through the chest, abdomen and pelvis was
performed using the standard protocol during bolus administration of
intravenous contrast. Multiplanar reconstructed images and MIPs were
obtained and reviewed to evaluate the vascular anatomy.
CONTRAST:  80 mL of Isovue 370.

[Series 6: lung · axial · 0.92mm/px · z∈[+106,+240]mm · 2 of 202 slices shown]
[im 68/202  lung]
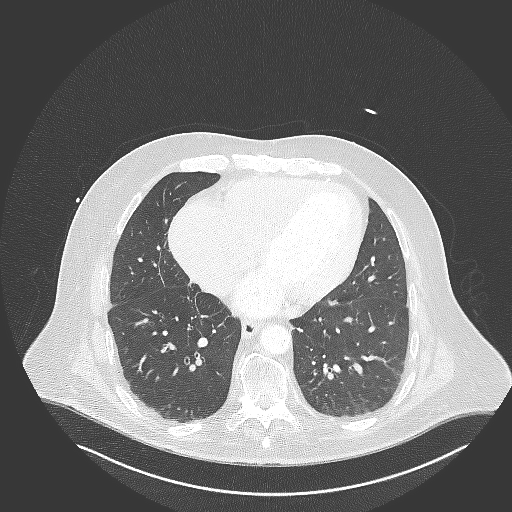
[im 135/202  mediastinal]
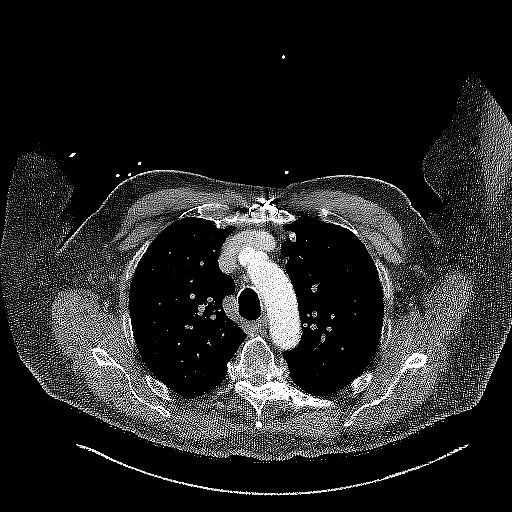

[2 of 36 positions shown; findings below may reference images not displayed]

FINDINGS: CTA CHEST FINDINGS

Cardiovascular: Heart size is mildly enlarged with left atrial
dilatation. Filling defect in the tip of the left atrial appendage
(axial image 97 of series 5), highly concerning for left atrial
appendage thrombus. There is no significant pericardial fluid,
thickening or pericardial calcification. There is aortic
atherosclerosis, as well as atherosclerosis of the great vessels of
the mediastinum and the coronary arteries, including calcified
atherosclerotic plaque in the left main, left anterior descending,
left circumflex and right coronary arteries. Patient is status post
median sternotomy for CABG including [REDACTED] to the LAD.

Mediastinum/Lymph Nodes: Mildly enlarged low right paratracheal
lymph node measuring 12 mm in short axis. No other mediastinal are
hilar lymphadenopathy. Esophagus is unremarkable in appearance. No
axillary lymphadenopathy.

Lungs/Pleura: No suspicious appearing pulmonary nodules or masses.
No acute consolidative airspace disease. No pleural effusions.

Musculoskeletal/Soft Tissues: Old healed fracture of the left
clavicle. There are no aggressive appearing lytic or blastic lesions
noted in the visualized portions of the skeleton.

CTA ABDOMEN AND PELVIS FINDINGS

Hepatobiliary: No definite cystic or solid hepatic lesions. No intra
or extrahepatic biliary ductal dilatation. Status post
cholecystectomy.

Pancreas: Well-defined 12 mm low-attenuation lesion in the body of
the pancreas (axial image 183 of series 5). No other suspicious
appearing pancreatic mass. No pancreatic ductal dilatation. No
pancreatic or peripancreatic fluid or inflammatory changes.

Spleen: The wedge-shaped hypovascular area in the lateral aspect of
the spleen could reflect a recent splenic infarct, best appreciated
on image 164 of series 5 and coronal image 96 of series 8).

Adrenals/Urinary Tract: 2 wedge-shaped areas of hypoenhancement are
noted in the lower pole of the left kidney, concerning for potential
recent infarcts. Subcentimeter low-attenuation lesion in the upper
pole the right kidney is too small to definitively characterize.
cm parapelvic cyst in the interpolar region of the right kidney. No
hydroureteronephrosis. Urinary bladder is normal in appearance.

Stomach/Bowel: Normal appearance of the stomach. No pathologic
dilatation of small bowel or colon. The appendix is not confidently
identified and may be surgically absent. Regardless, there are no
inflammatory changes noted adjacent to the cecum to suggest the
presence of an acute appendicitis at this time.

Vascular/Lymphatic: Aortic atherosclerosis, without evidence of
aneurysm or dissection in the abdominal or pelvic vasculature.
Vascular findings and measurements pertinent to potential TAVR
procedure, as detailed below. Celiac axis, superior mesenteric
artery and inferior mesenteric artery are all widely patent without
hemodynamically significant stenosis. Single left and two right
renal arteries are widely patent. No lymphadenopathy noted in the
abdomen or pelvis.

Reproductive: Prostate gland and seminal vesicles are unremarkable
in appearance.

Other: No significant volume of ascites.  No pneumoperitoneum.

Musculoskeletal: There are no aggressive appearing lytic or blastic
lesions noted in the visualized portions of the skeleton.

VASCULAR MEASUREMENTS PERTINENT TO TAVR:

AORTA:

Minimal Aortic Diameter -  13 x 16 mm

Severity of Aortic Calcification -  severe

RIGHT PELVIS:

Right Common Iliac Artery -

Minimal Diameter - 8.9 x 6.1 mm

Tortuosity - mild

Calcification - moderate

Right External Iliac Artery -

Minimal Diameter - 8.6 x 7.8 mm

Tortuosity - mild

Calcification - mild

Right Common Femoral Artery -

Minimal Diameter - 8.1 x 6.3 mm

Tortuosity - mild

Calcification - mild

LEFT PELVIS:

Left Common Iliac Artery -

Minimal Diameter - 9.5 x 8.9 mm

Tortuosity - mild

Calcification - moderate

Left External Iliac Artery -

Minimal Diameter - 7.9 x 6.3 mm

Tortuosity - mild

Calcification - mild

Left Common Femoral Artery -

Minimal Diameter - 8.6 x 5.2 mm

Tortuosity - mild

Calcification - mild

Review of the MIP images confirms the above findings.
IMPRESSION: 1. Vascular findings and measurements pertinent to potential TAVR
procedure, as detailed above. This patient does appear to have
suitable pelvic arterial access bilaterally.
2. Cardiomegaly with left atrial dilatation. Notably, there is a
filling defect in the tip of the left atrial appendage highly
concerning for left atrial appendage thrombus. If present, this
places the patient at risk for potential systemic embolization.
Correlation with transesophageal echocardiography is strongly
recommended if clinically appropriate.
3. Importantly, there are peripheral wedge-shaped hypovascular areas
in both the spleen and the lower pole of the left kidney, which
could reflect recent embolic infarcts.
4. Severe thickening calcification of the aortic valve, compatible
with the reported clinical history of aortic stenosis.
5. Aortic atherosclerosis, in addition to left main and 3 vessel
coronary artery disease. Status post median sternotomy for CABG
including [REDACTED] to the LAD.
6. Mildly enlarged low right paratracheal lymph node. This is
nonspecific, and could be chronic and reactive in this patient with
prior history of sternotomy. No definite other findings to suggest
malignancy noted elsewhere in the chest, abdomen or pelvis.
7. 12 mm low-attenuation lesion in the body of the pancreas. This is
nonspecific. Follow-up pancreatic protocol CT or MRI with contrast
is recommended in 2 years to ensure the stability of this lesion.
This recommendation follows ACR consensus guidelines: Management of
Incidental Pancreatic Cysts: A White Paper of the ACR Incidental
Findings Committee. [HOSPITAL] 2151;[DATE].
8. Additional incidental findings, as above.

## 2018-05-22 ENCOUNTER — Telehealth: Payer: Self-pay | Admitting: *Deleted

## 2018-05-22 NOTE — Telephone Encounter (Signed)
Pt has been in [facility] for [facility] since April 2019 and they were dosing coumadin.  Pt is back at home now.  Lori asked pt and wife if they would be able to travel to our [place] office for continued cardiac care and they state they will.  Son is going to help them and coordinate appts.  Order given to Lawson FiscalLori to check INR tomorrow and call me with results.  She verbalized understanding.

## 2018-05-23 ENCOUNTER — Telehealth: Payer: Self-pay | Admitting: *Deleted

## 2018-05-23 ENCOUNTER — Ambulatory Visit (INDEPENDENT_AMBULATORY_CARE_PROVIDER_SITE_OTHER): Payer: Medicare Other | Admitting: *Deleted

## 2018-05-23 DIAGNOSIS — I481 Persistent atrial fibrillation: Secondary | ICD-10-CM

## 2018-05-23 DIAGNOSIS — I4811 Longstanding persistent atrial fibrillation: Secondary | ICD-10-CM

## 2018-05-23 DIAGNOSIS — Z5181 Encounter for therapeutic drug level monitoring: Secondary | ICD-10-CM

## 2018-05-23 LAB — POCT INR: INR: 2.3 (ref 2.0–3.0)

## 2018-05-23 NOTE — Telephone Encounter (Signed)
Done.  See coumadin note. 

## 2018-05-23 NOTE — Patient Instructions (Signed)
Pt back home after stay at [facility] Pt is taking coumadin 7mg  daily Pt to continue same dose and order given to Lehman BrothersLori RN [facility] Recheck in 1 week

## 2018-05-23 NOTE — Telephone Encounter (Signed)
PT 28.1 INR 2.3  has been taking 7mg  coumadin since Sunday   Per Horton ChinLori Hinchee w/ [facility] tele [phone]

## 2018-05-30 ENCOUNTER — Other Ambulatory Visit (HOSPITAL_COMMUNITY): Payer: Medicare Other

## 2018-05-30 ENCOUNTER — Ambulatory Visit: Payer: Medicare Other | Admitting: Physician Assistant

## 2018-06-10 IMAGING — CR DG CHEST 2V
2 series · 2 of 2 positions shown · non-contrast
Comparison: 04/06/2017

CLINICAL DATA: Preoperative evaluation

EXAM:
CHEST  2 VIEW

[w chest pa]
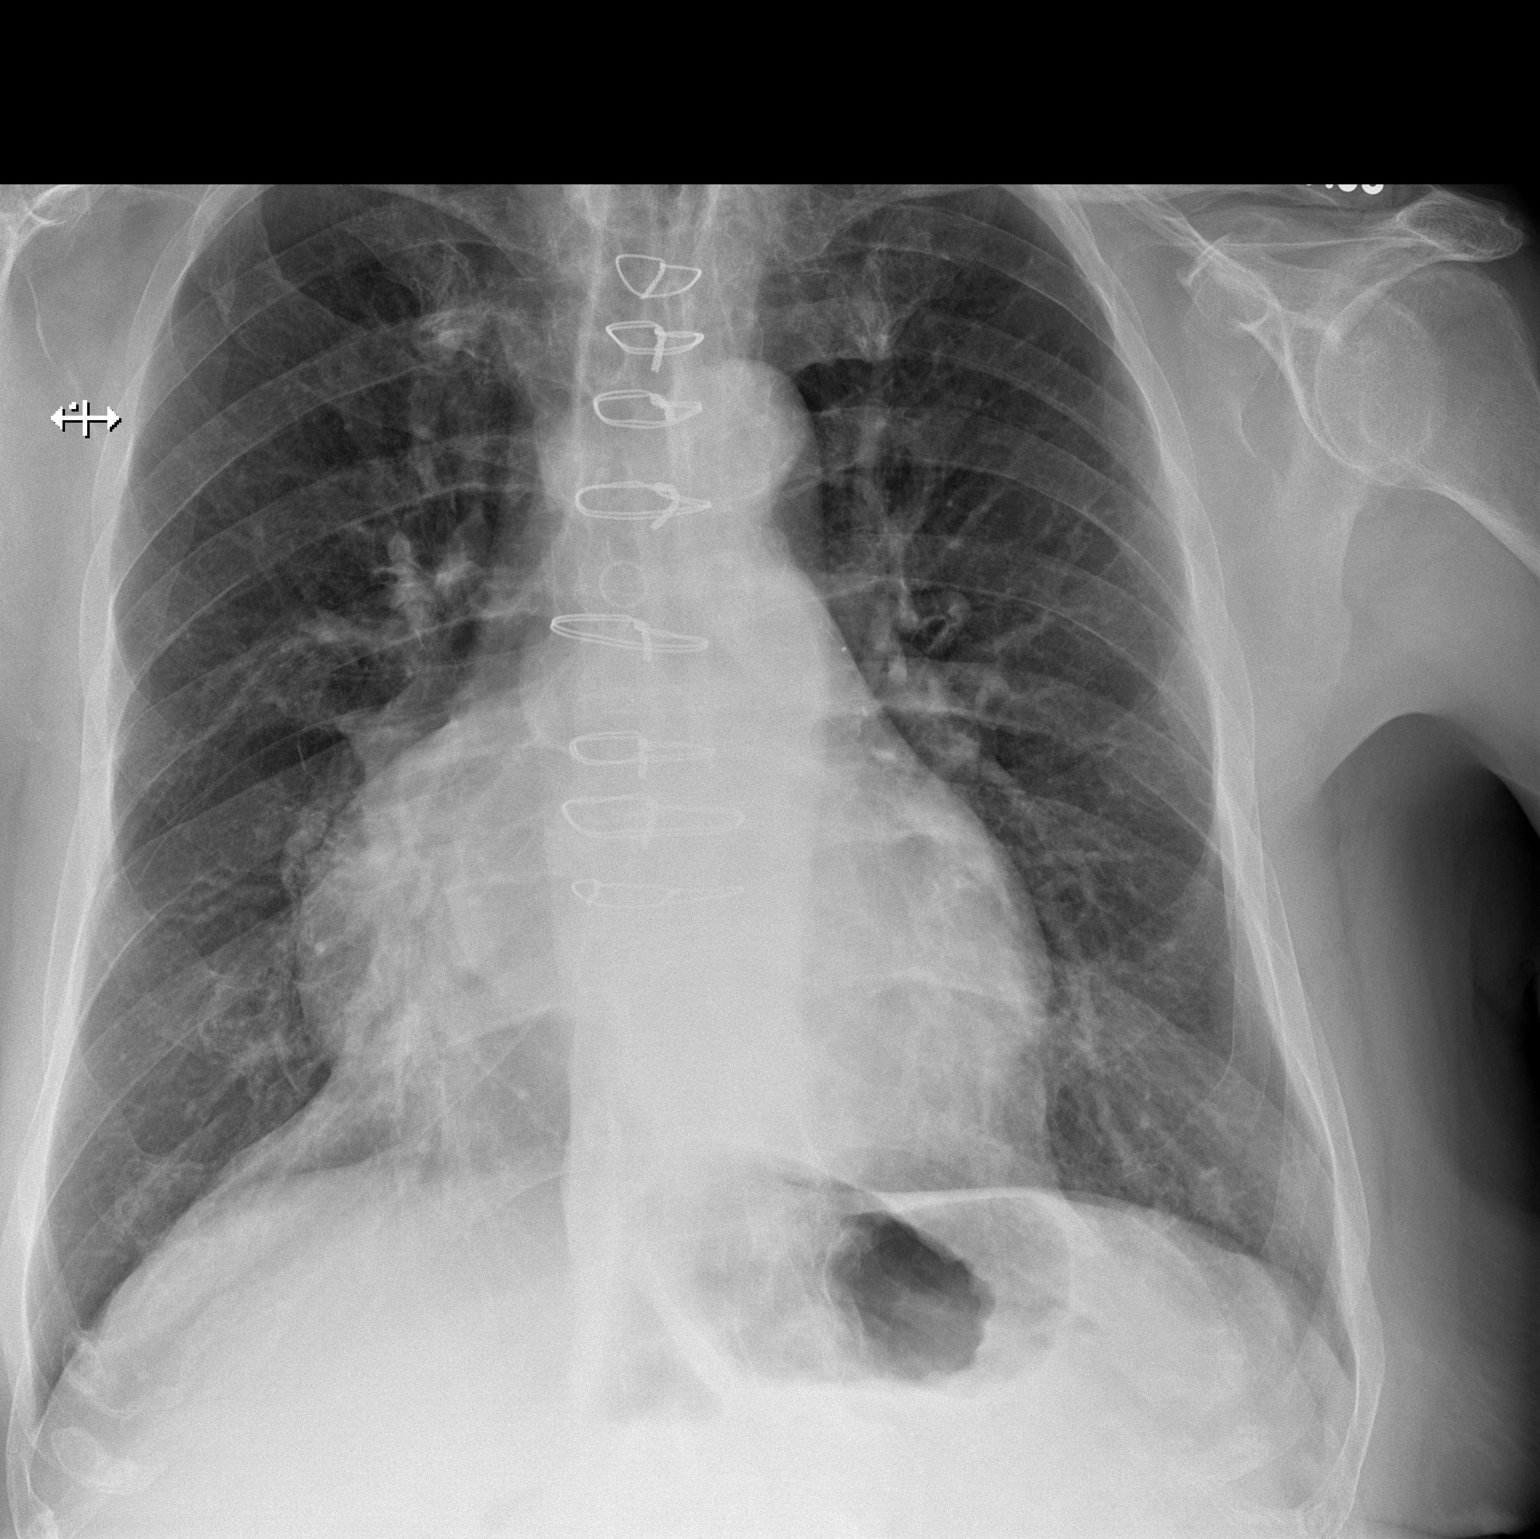

[w chest lat]
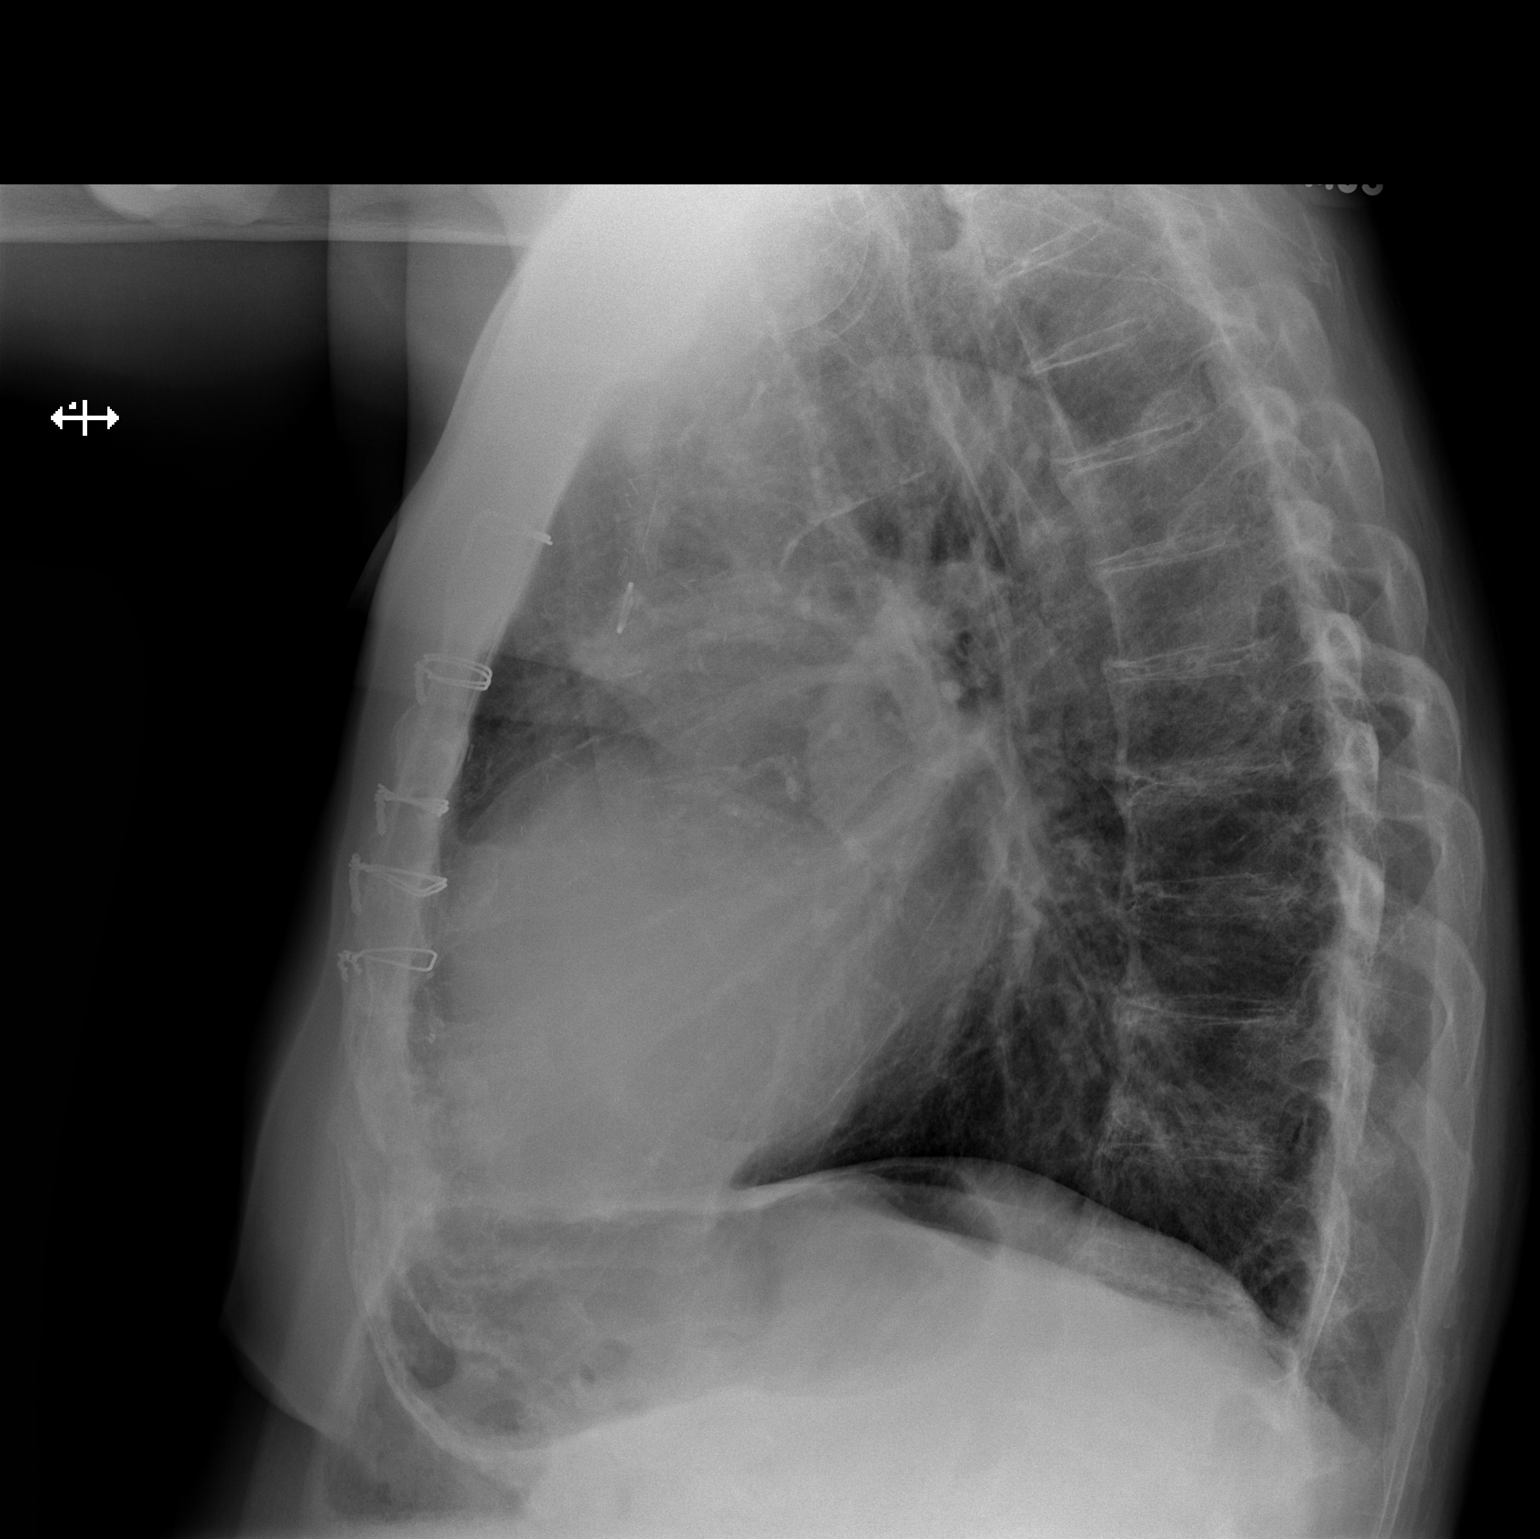

[2 of 2 positions shown; findings below may reference images not displayed]

FINDINGS: Cardiac shadow remains enlarged. Postsurgical changes are again
seen. The lungs are well aerated bilaterally without focal
infiltrate or sizable effusion. Mild degenerative changes of
thoracic spine are noted.
IMPRESSION: No acute abnormality seen.

## 2018-06-11 ENCOUNTER — Ambulatory Visit: Payer: Self-pay | Admitting: *Deleted

## 2018-06-11 ENCOUNTER — Telehealth: Payer: Self-pay

## 2018-06-11 DIAGNOSIS — Z5181 Encounter for therapeutic drug level monitoring: Secondary | ICD-10-CM

## 2018-06-11 DIAGNOSIS — I4811 Longstanding persistent atrial fibrillation: Secondary | ICD-10-CM

## 2018-06-11 NOTE — Telephone Encounter (Signed)
I attempted to call the pt's home to see how the pt was doing since he was transported to [facility].  The pt was booked for 1 year TAVR follow-up on 06/13/18 but this was cancelled.  The pt had TAVR 05/09/2017.  Voicemail is full at this time and will not accept messages.

## 2018-06-11 NOTE — Telephone Encounter (Signed)
-----   Message from Clifford MooreLydia M Anderson, LPN sent at 1/61/09607/26/2019 11:37 AM EDT ----- Regarding: canceled appt for 06/13/18 Wife called and said patient is very sick-bleeding on brain and transported to [facility].

## 2018-06-13 ENCOUNTER — Other Ambulatory Visit (HOSPITAL_COMMUNITY): Payer: Medicare Other

## 2018-06-13 ENCOUNTER — Ambulatory Visit: Payer: Medicare Other | Admitting: Physician Assistant

## 2018-06-17 IMAGING — DX DG CHEST 1V PORT
1 series · 1 of 1 positions shown · non-contrast
Comparison: Preoperative study May 02, 2017

CLINICAL DATA: Status post transcatheter aortic valve replacement.
Former smoker.

EXAM:
PORTABLE CHEST 1 VIEW

[chest ap]
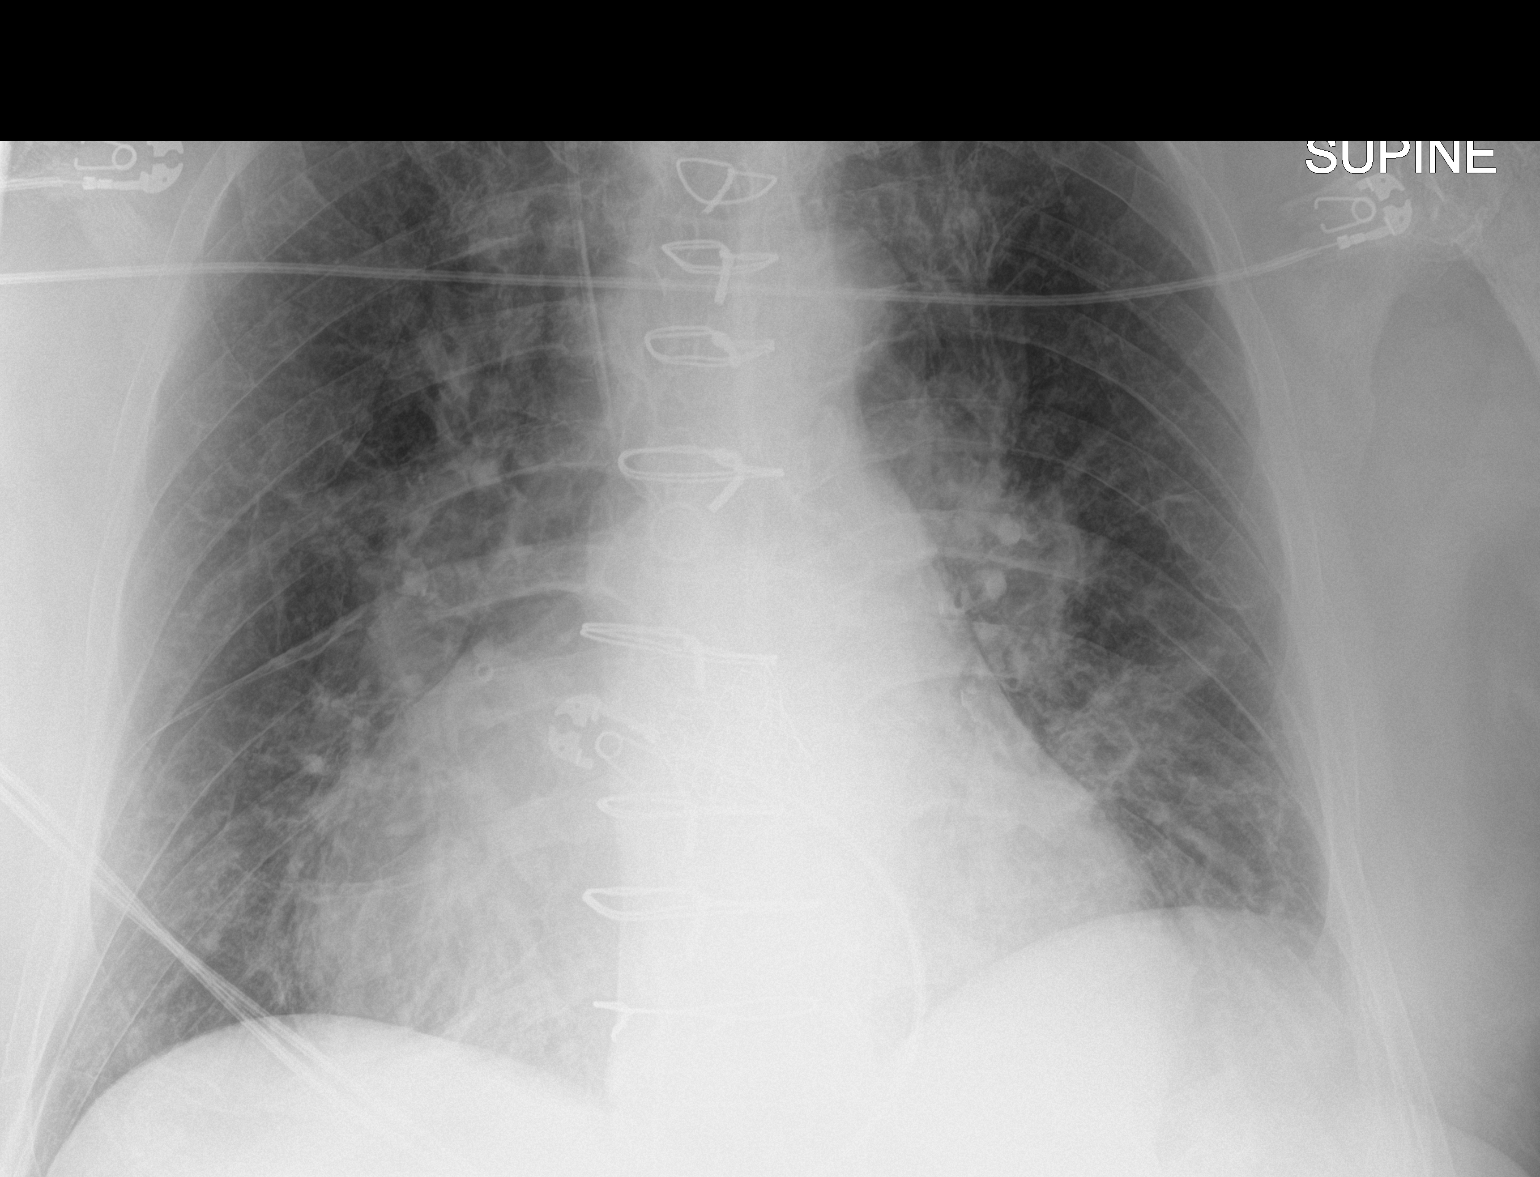

[1 of 1 positions shown; findings below may reference images not displayed]

FINDINGS: The lungs are well-expanded. The interstitial markings are coarse
but some of this is chronic. The cardiac silhouette is enlarged. The
central pulmonary vascularity is prominent. There is mild
cephalization of the vascular pattern. The right internal jugular
venous catheter tip projects over the proximal SVC. The sternal
wires are intact. The aortic valve cage is visible and appears to be
in reasonable position radiographically. There is old deformity of
the left clavicle.
IMPRESSION: Low-grade CHF superimposed upon chronic bronchitic -smoking related
changes. No alveolar edema or pleural effusion.

## 2018-06-18 IMAGING — DX DG CHEST 1V PORT
1 series · 1 of 1 positions shown · non-contrast
Comparison: 05/09/2017 .  05/02/2017.

CLINICAL DATA: Aortic valve replacement .

EXAM:
PORTABLE CHEST 1 VIEW

[chest ap]
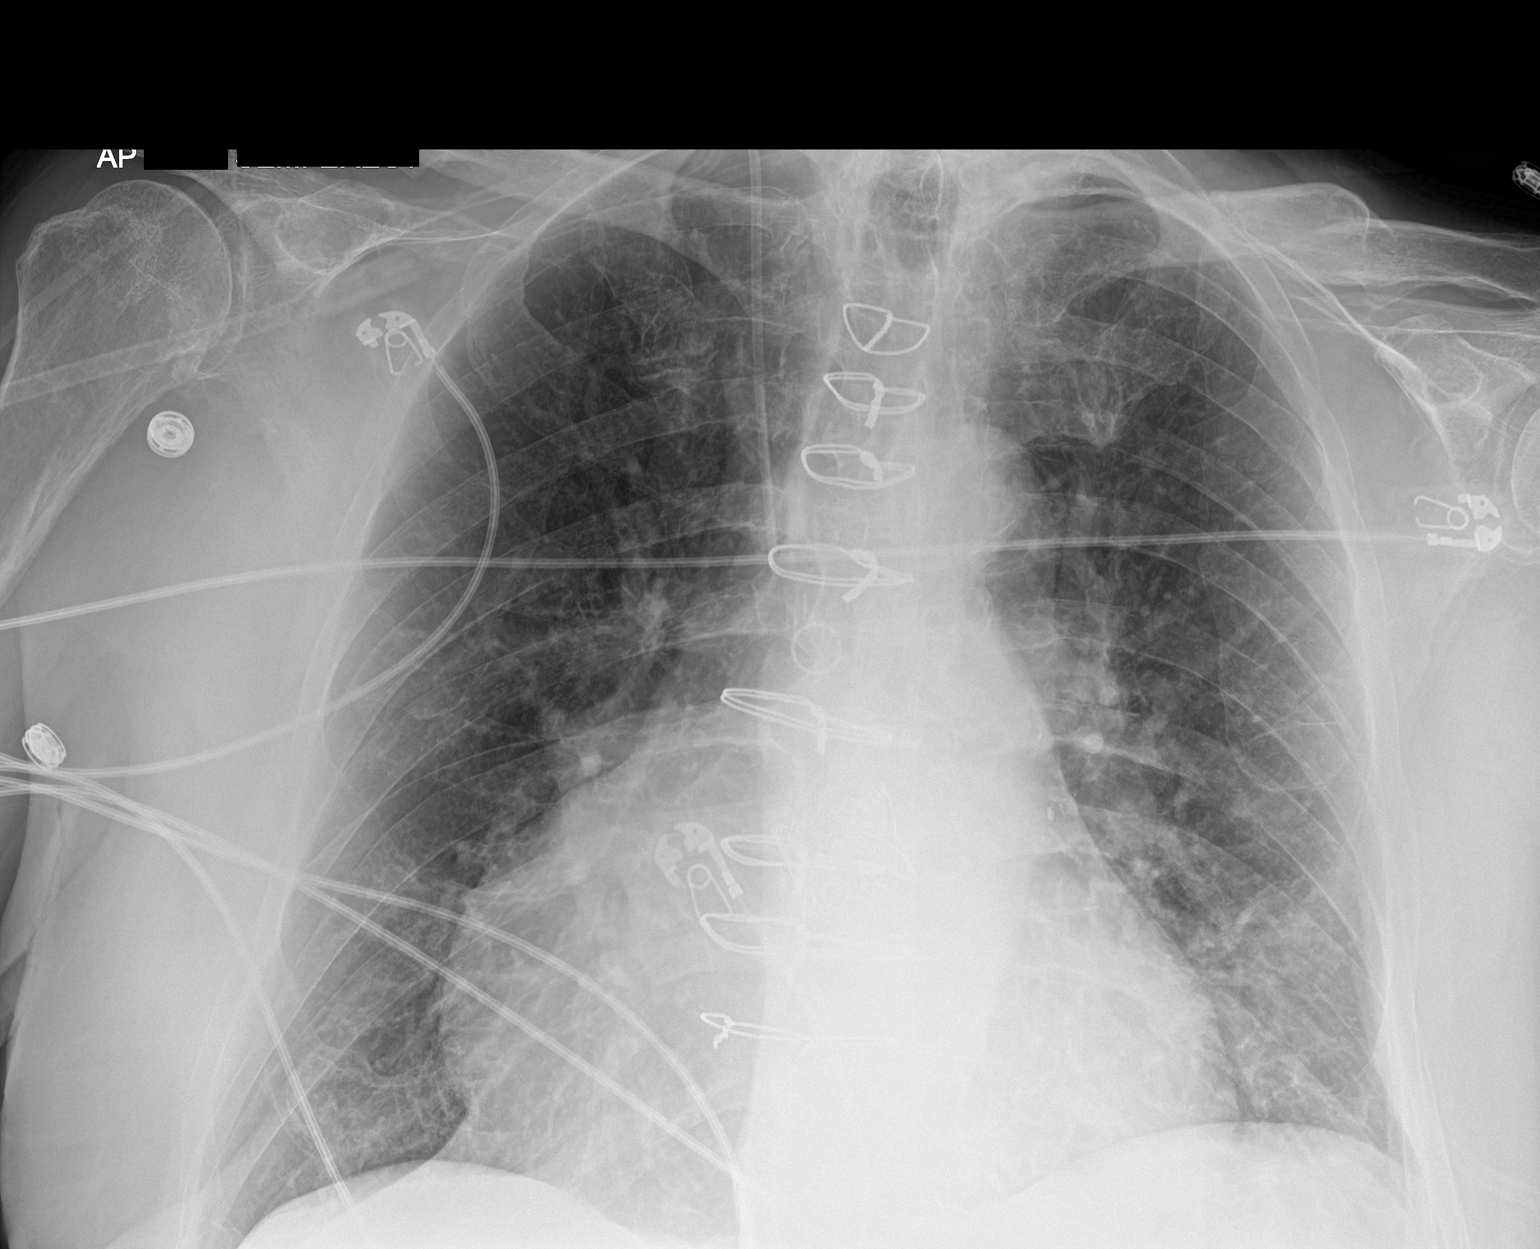

[1 of 1 positions shown; findings below may reference images not displayed]

FINDINGS: Right IJ line in stable position. Prior CABG. Aortic valve
replacement. Stable cardiomegaly. Mild basilar interstitial
scratched it mild bilateral interstitial prominence again noted
suggesting mild CHF. Right costophrenic angle not imaged. Slight
elevation left hemidiaphragm. No pneumothorax.
IMPRESSION: 1.  Right IJ line stable position.

2. Prior CABG. Severe cardiomegaly again noted. Mild pulmonary
venous congestion with basilar interstitial prominence noted consist
with mild CHF.

## 2018-06-18 NOTE — Telephone Encounter (Signed)
I spoke with the pt's wife and the pt remains hospitalized at [facility] per Care Everywhere) due to brain hemorrhage.  At this time they are tentatively telling her the pt may be discharged on 06/26/18. Due to the pt's current condition 1 year TAVR follow-up was previously cancelled and will not be rescheduled at this time. An Echo has not been performed at [facility] per Care Everywhere.

## 2018-06-21 NOTE — Telephone Encounter (Signed)
thx for letting me know 

## 2018-08-24 NOTE — Progress Notes (Signed)
Cardiology Office Note  Date: 08/27/2018   ID: Clifford King, DOB 1940/12/29, MRN [id number]  PCP: Chana Bode, DO  Primary Cardiologist: Nona Dell, MD   Chief Complaint  Patient presents with  . Aortic valve disease  . Atrial Fibrillation    History of Present Illness: Clifford King is a 77 y.o. male last seen in January.  I reviewed interval records.  He was hospitalized in [place] back in July following falls with subsequently documented left frontoparietal subdural hematoma.  He was seen for neurosurgical evaluation but did not have to undergo intervention.  He was taken off Coumadin.  Most recent Neurosurgical note from September 30 indicated resolution of subdural hematoma by head CT with clearance to resume Coumadin.  He is here with his wife today.  We discussed the events around his falls.  She tells me that he was having episodes of significant confusion and hallucinations, getting up at midnight and wandering around.  He does not recall all of these events but apparently was having several falls.  She states that his confusion seems to be better, he is still unsteady on his feet however, undergoing physical therapy.  I personally reviewed his ECG today which shows atrial fibrillation with left bundle branch block.  He is due for a follow-up echocardiogram which we discussed and will arrange.  Past Medical History:  Diagnosis Date  . Arthritis   . Atrial fibrillation (HCC)   . Carotid artery disease (HCC)    Nonobstructive  . Coronary atherosclerosis of native coronary artery    Multivessel, LVEF 60%, occluded small nondominant RCA  (not bypassed)  . Essential hypertension, benign   . LBBB (left bundle branch block)   . Mixed hyperlipidemia   . Morbid obesity (HCC)   . 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 Severe aortic stenosis     Past Surgical History:  Procedure Laterality Date  . CORONARY ARTERY BYPASS GRAFT  02/17/2003   Off-pump LIMA to LAD, SVG to diagonal - Dr Tyrone Sage  . RIGHT/LEFT HEART CATH AND CORONARY ANGIOGRAPHY N/A 03/29/2017   Procedure: Right/Left Heart Cath and Coronary Angiography;  Surgeon: Tonny Bollman, MD;  Location: [facility] INVASIVE CV LAB;  Service: Cardiovascular;  Laterality: N/A;  . Rotater cuff Left   . TEE WITHOUT CARDIOVERSION N/A 05/09/2017   Procedure: TRANSESOPHAGEAL ECHOCARDIOGRAM (TEE);  Surgeon: Tonny Bollman, MD;  Location: [facility] OR;  Service: Open Heart Surgery;  Laterality: N/A;  . TRANSCATHETER AORTIC VALVE REPLACEMENT, TRANSFEMORAL N/A 05/09/2017   Procedure: TRANSCATHETER AORTIC VALVE REPLACEMENT, TRANSFEMORAL;  Surgeon: Tonny Bollman, MD;  Location: [facility] OR;  Service: Open Heart Surgery;  Laterality: N/A;  . VARICOSE VEIN SURGERY Right 1988    Current Outpatient Medications  Medication Sig Dispense Refill  . bisacodyl (FLEET) 10 MG/30ML ENEM Place 10 mg rectally once.    . carvedilol (COREG) 3.125 MG tablet Take 3.125 mg by mouth 2 (two) times daily with a meal.    . divalproex (DEPAKOTE SPRINKLE) 125 MG capsule Take by mouth 2 (two) times daily. TAKE 2 TABLETS TWICE DAILY    . Melatonin 5 MG TABS Take by mouth.    Marland Kitchen  Nystatin (NYAMYC) 100000 UNIT/GM POWD Apply topically daily as needed (infections).     . senna (SENOKOT) 8.6 MG TABS tablet Take 1 tablet by mouth daily.    . simvastatin (ZOCOR) 10 MG tablet Take 1 tablet (10 mg total) by mouth at bedtime. 90 tablet 3   No current facility-administered medications for this visit.    Allergies:  Vicodin [hydrocodone-acetaminophen]   Social History: The patient  reports that he quit smoking about 31 years  ago. His smoking use included cigars. He started smoking about 41 years ago. He has a 5.00 pack-year smoking history. He has never used smokeless tobacco. He reports that he does not drink alcohol or use drugs.   ROS:  Please see the history of present illness. Otherwise, complete review of systems is positive for significant weight loss, dysphasia after the interval events.  All other systems are reviewed and negative.   Physical Exam: VS:  BP 110/72   Pulse 61   Ht 6\' 5"  (1.956 m)   Wt 203 lb (92.1 kg)   SpO2 98%   BMI 24.07 kg/m , BMI Body mass index is 24.07 kg/m.  Wt Readings from Last 3 Encounters:  08/27/18 203 lb (92.1 kg)  12/04/17 268 lb (121.6 kg)  06/08/17 251 lb 12.8 oz (114.2 kg)    General: Patient appears comfortable at rest. HEENT: Conjunctiva and lids normal, oropharynx clear. Neck: Supple, no elevated JVP or carotid bruits, no thyromegaly. Lungs: Clear to auscultation, nonlabored breathing at rest. Cardiac: Irregularly irregular, no S3, soft systolic murmur. Abdomen: Soft, nontender, bowel sounds present. Extremities: Mild ankle edema, distal pulses 2+. Skin: Warm and dry. Musculoskeletal: No kyphosis. Neuropsychiatric: Alert and oriented x3, affect grossly appropriate.  ECG: I personally reviewed the tracing from 06/08/2017 which showed rate controlled atrial fibrillation with left bundle branch block.  Recent Labwork:  05/02/2017: ALT 28; AST 29 05/10/2017: Magnesium 1.8 05/11/2017: BUN 12; Creatinine, Ser 0.87; Hemoglobin 13.4; Platelets 84; Potassium 3.4; Sodium 139   Other Studies Reviewed Today:  Echocardiogram 06/08/2017: Study Conclusions  - Left ventricle: The cavity size was moderately dilated. Systolic function was mildly to moderately reduced. The estimated ejection fraction was in the range of 40% to 45%. Mild diffuse hypokinesis. There is akinesis of the apical septal and apical inferior myocardium. There is akinesis of the  apicalinferior myocardium. Doppler parameters are consistent with high ventricular filling pressure. - Aortic valve: S/P TAVR well seated and functioning normally. The mean AV gradient is and the peak AV gradient is . There is no evidence of perivalvular leak. Mean gradient (S): 11 mm Hg. Peak gradient (S): 18 mm Hg. - Mitral valve: There was mild regurgitation. - Left atrium: The atrium was severely dilated. - Right ventricle: Systolic function was mildly reduced. - Right atrium: The atrium was mildly dilated. - Pulmonary arteries: PA peak pressure: 48 mm Hg (S).  Impressions:  - Compared to last echo, TAVR is now present. LVF remains mildly reduced at 40-45%. The right ventricular systolic pressure was increased consistent with moderate pulmonary hypertension.  Assessment and Plan:  1.  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  I reviewed available records, do not have access to all information.  Based on recent Neurosurgical notes with follow-up head CT, the subdural hematoma has cleared.  I am still reluctant to resume anticoagulation however as he remains unsteady on his feet and prone to falls.  I discussed the issue with him today  as he still remains at risk for stroke with permanent atrial fibrillation.  He himself indicated a hesitancy to resume anticoagulation at this time.  We will continue to follow for now.  2.  Severe aortic stenosis status post TAVR.  Follow-up echocardiogram will be obtained to reassess valve function comparison to study from last July.  Heart murmur stable.  3.  Permanent atrial fibrillation, heart rate is well controlled on Coreg.  4.  Chronic left bundle branch block.  5.  Multivessel CAD status post CABG in 2004.  No active angina symptoms.  Current medicines were reviewed with the patient today.   Orders Placed This Encounter  Procedures  . EKG 12-Lead  .  ECHOCARDIOGRAM COMPLETE    Disposition: Follow-up in 4 to 6 weeks.  Signed, Jonelle Sidle, MD, [facility] 08/27/2018 11:26 AM    [facility] at [facility] [address] [place], [place], [place] [address] Phone: [phone]; Fax: [phone]

## 2018-08-27 ENCOUNTER — Encounter: Payer: Self-pay | Admitting: Cardiology

## 2018-08-27 ENCOUNTER — Ambulatory Visit (INDEPENDENT_AMBULATORY_CARE_PROVIDER_SITE_OTHER): Payer: Medicare Other | Admitting: Cardiology

## 2018-08-27 VITALS — BP 110/72 | HR 61 | Ht 77.0 in | Wt 203.0 lb

## 2018-08-27 DIAGNOSIS — I4821 Permanent atrial fibrillation: Secondary | ICD-10-CM

## 2018-08-27 DIAGNOSIS — Z952 Presence of prosthetic heart valve: Secondary | ICD-10-CM

## 2018-08-27 DIAGNOSIS — I25119 Atherosclerotic heart disease of native coronary artery with unspecified angina pectoris: Secondary | ICD-10-CM

## 2018-08-27 DIAGNOSIS — Z87828 Personal history of other (healed) physical injury and trauma: Secondary | ICD-10-CM | POA: Diagnosis not present

## 2018-08-27 NOTE — Patient Instructions (Signed)
Medication Instructions:  Continue all current medications.  Labwork: none  Testing/Procedures:  Your physician has requested that you have an echocardiogram. Echocardiography is a painless test that uses sound waves to create images of your heart. It provides your doctor with information about the size and shape of your heart and how well your heart's chambers and valves are working. This procedure takes approximately one hour. There are no restrictions for this procedure.  Office will contact with results via phone or letter.    Follow-Up: 4-6 weeks   Any Other Special Instructions Will Be Listed Below (If Applicable).  If you need a refill on your cardiac medications before your next appointment, please call your pharmacy.  

## 2018-08-29 ENCOUNTER — Other Ambulatory Visit: Payer: Self-pay | Admitting: Cardiology

## 2018-08-29 DIAGNOSIS — I359 Nonrheumatic aortic valve disorder, unspecified: Secondary | ICD-10-CM

## 2018-08-30 ENCOUNTER — Other Ambulatory Visit: Payer: Self-pay

## 2018-08-30 ENCOUNTER — Ambulatory Visit (INDEPENDENT_AMBULATORY_CARE_PROVIDER_SITE_OTHER): Payer: Medicare Other

## 2018-08-30 DIAGNOSIS — I359 Nonrheumatic aortic valve disorder, unspecified: Secondary | ICD-10-CM | POA: Diagnosis not present

## 2018-09-03 ENCOUNTER — Telehealth: Payer: Self-pay | Admitting: *Deleted

## 2018-09-03 NOTE — Telephone Encounter (Signed)
-----   Message from Jonelle Sidle, MD sent at 09/02/2018  2:09 PM EDT ----- Results reviewed. LVEF is relatively stable at 40% and TAVR prosthesis shows normal function. Continue with current follow-up plan. A copy of this test should be forwarded to Chana Bode, DO.

## 2018-09-03 NOTE — Telephone Encounter (Signed)
Patient informed and copy sent to PCP. 

## 2018-10-08 NOTE — Progress Notes (Signed)
Cardiology Office Note  Date: 10/09/2018   ID: Clifford King, DOB 01/09/1941, MRN [id number]  PCP: Chana Bode, DO  Primary Cardiologist: Nona Dell, MD   Chief Complaint  Patient presents with  . Cardiac follow-up    History of Present Illness: Clifford King is a 77 y.o. male last seen in October.  He is here for a follow-up visit.  Since last encounter he does not report any falls, but still has some unsteadiness when he walks.  He does not report any chest pain or palpitations.  Recent follow-up echocardiogram showed stable LVEF of approximately 40% with diffuse hypokinesis, stable TAVR function without perivalvular leak or significant aortic regurgitation.  We went over his medications.  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  We have arrived to the plan for him to resume aspirin at 81 mg and stay off anticoagulation for now.  His blood pressure is higher than usual today.  He continues to follow with PCP.  Past Medical History:  Diagnosis Date  . Arthritis   . Atrial fibrillation (HCC)   . Carotid artery disease (HCC)    Nonobstructive  . Coronary atherosclerosis of native coronary artery    Multivessel, LVEF 60%, occluded small nondominant RCA  (not bypassed)  . Essential hypertension, benign   . LBBB (left bundle branch block)   . Mixed hyperlipidemia   . Morbid obesity (HCC)   . 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 Severe aortic stenosis     Past Surgical History:  Procedure Laterality  Date  . CORONARY ARTERY BYPASS GRAFT  02/17/2003   Off-pump LIMA to LAD, SVG to diagonal - Dr Tyrone Sage  . RIGHT/LEFT HEART CATH AND CORONARY ANGIOGRAPHY N/A 03/29/2017   Procedure: Right/Left Heart Cath and Coronary Angiography;  Surgeon: Tonny Bollman, MD;  Location: [facility] INVASIVE CV LAB;  Service: Cardiovascular;  Laterality: N/A;  . Rotater cuff Left   . TEE WITHOUT CARDIOVERSION N/A 05/09/2017   Procedure: TRANSESOPHAGEAL ECHOCARDIOGRAM (TEE);  Surgeon: Tonny Bollman, MD;  Location: [facility] OR;  Service: Open Heart Surgery;  Laterality: N/A;  . TRANSCATHETER AORTIC VALVE REPLACEMENT, TRANSFEMORAL N/A 05/09/2017   Procedure: TRANSCATHETER AORTIC VALVE REPLACEMENT, TRANSFEMORAL;  Surgeon: Tonny Bollman, MD;  Location: [facility] OR;  Service: Open Heart Surgery;  Laterality: N/A;  . VARICOSE VEIN SURGERY Right 1988    Current Outpatient Medications  Medication Sig Dispense Refill  . bisacodyl (FLEET) 10 MG/30ML ENEM Place 10 mg rectally once.    . carvedilol (COREG) 3.125 MG tablet Take 3.125 mg by mouth 2 (two) times daily with a meal.    . divalproex (DEPAKOTE SPRINKLE) 125 MG capsule Take by mouth 2 (two) times daily. TAKE 2 TABLETS TWICE DAILY    . Melatonin 5 MG TABS Take by mouth.    . Nystatin (NYAMYC) 100000 UNIT/GM POWD Apply topically daily as needed (infections).     . senna (SENOKOT) 8.6 MG TABS tablet Take 1 tablet by mouth daily.    . simvastatin (ZOCOR) 10 MG tablet Take 1 tablet (10 mg  total) by mouth at bedtime. 90 tablet 3  . aspirin EC 81 MG tablet Take 1 tablet (81 mg total) by mouth daily.     No current facility-administered medications for this visit.    Allergies:  Vicodin [hydrocodone-acetaminophen]   Social History: The patient  reports that he quit smoking about 31 years ago. His smoking use included cigars. He started smoking about 41 years ago. He has a 5.00 pack-year smoking history. He has never used smokeless tobacco. He reports that he does not drink alcohol or use  drugs.   ROS:  Please see the history of present illness. Otherwise, complete review of systems is positive for hearing loss.  All other systems are reviewed and negative.   Physical Exam: VS:  BP (!) 140/92   Pulse 76   Ht 6\' 5"  (1.956 m)   Wt 212 lb (96.2 kg)   SpO2 98%   BMI 25.14 kg/m , BMI Body mass index is 25.14 kg/m.  Wt Readings from Last 3 Encounters:  10/09/18 212 lb (96.2 kg)  08/27/18 203 lb (92.1 kg)  12/04/17 268 lb (121.6 kg)    General: Patient appears comfortable at rest. HEENT: Conjunctiva and lids normal, oropharynx clear. Neck: Supple, no elevated JVP or carotid bruits, no thyromegaly. Lungs: Clear to auscultation, nonlabored breathing at rest. Cardiac: Irregularly irregular, no S3, 2/6 systolic murmur, no pericardial rub. Abdomen: Soft, nontender, bowel sounds present. Extremities: Mild ankle edema, distal pulses 2+. Skin: Warm and dry. Musculoskeletal: No kyphosis. Neuropsychiatric: Alert and oriented x3, affect grossly appropriate.  ECG: I personally reviewed the tracing from 08/27/2018 which showed atrial fibrillation with left bundle branch block.  Recent Labwork:  05/02/2017: ALT 28; AST 29 05/10/2017: Magnesium 1.8 05/11/2017: BUN 12; Creatinine, Ser 0.87; Hemoglobin 13.4; Platelets 84; Potassium 3.4; Sodium 139  Other Studies Reviewed Today:  Echocardiogram 08/30/2018: Study Conclusions  - Left ventricle: The cavity size was mildly dilated. Wall   thickness was normal. Systolic function was moderately reduced.   The estimated ejection fraction was 40%. Diffuse hypokinesis. The   study is not technically sufficient to allow evaluation of LV   diastolic function. Doppler parameters are consistent with   indeterminate ventricular filling pressure. - Regional wall motion abnormality: Akinesis of the mid anterior,   mid anteroseptal, and apical myocardium. - Aortic valve: S/p TAVR. Appears to be functioning normally. There   was no  regurgitation. Peak velocity (S): 195 cm/s. Mean gradient   (S): 8 mm Hg. - Mitral valve: There was mild regurgitation. - Left atrium: The atrium was severely dilated. - Right ventricle: Systolic function was mildly reduced. - Right atrium: The atrium was moderately dilated. - Tricuspid valve: There was mild-moderate eccentric regurgitation. - Pulmonary arteries: PA peak pressure: 31 mm Hg (S). - Systemic veins: IVC is dilated with normal respiratory variation.   Estimated CVP 8 mmHg.  Assessment and Plan:  1.  Permanent atrial fibrillation.  Continue heart rate control with Coreg.  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  He will restart aspirin 81 mg daily.  2.  CAD status post CABG in 2004.  Resuming aspirin.  He also remains on Zocor.  No active angina symptoms.  3.  Severe aortic stenosis status post TAVR.  Echocardiogram from October showed stable prosthetic function with no significant perivalvular leak.  4.  Cardiomyopathy, LVEF approximately 40%.  Depending on blood pressure trend may  advance medical therapy with addition of ARB.  No evidence of fluid overload.  Current medicines were reviewed with the patient today.  Disposition: Follow-up in 6 months.  Signed, Jonelle Sidle, MD, [facility] 10/09/2018 2:33 PM    [facility] [address] [place], [place], [place] [address] Phone: [phone]; Fax: [phone]

## 2018-10-09 ENCOUNTER — Encounter: Payer: Self-pay | Admitting: Cardiology

## 2018-10-09 ENCOUNTER — Ambulatory Visit (INDEPENDENT_AMBULATORY_CARE_PROVIDER_SITE_OTHER): Payer: Medicare Other | Admitting: Cardiology

## 2018-10-09 VITALS — BP 140/92 | HR 76 | Ht 77.0 in | Wt 212.0 lb

## 2018-10-09 DIAGNOSIS — Z952 Presence of prosthetic heart valve: Secondary | ICD-10-CM

## 2018-10-09 DIAGNOSIS — I429 Cardiomyopathy, unspecified: Secondary | ICD-10-CM | POA: Diagnosis not present

## 2018-10-09 DIAGNOSIS — I4821 Permanent atrial fibrillation: Secondary | ICD-10-CM | POA: Diagnosis not present

## 2018-10-09 DIAGNOSIS — I25119 Atherosclerotic heart disease of native coronary artery with unspecified angina pectoris: Secondary | ICD-10-CM

## 2018-10-09 MED ORDER — ASPIRIN EC 81 MG PO TBEC: 81.0000 mg | DELAYED_RELEASE_TABLET | Freq: Every day | ORAL | Status: AC

## 2018-10-09 NOTE — Patient Instructions (Signed)
Medication Instructions:   Begin Aspirin 81mg daily.  Continue all other medications.    Labwork: none  Testing/Procedures: none  Follow-Up: Your physician wants you to follow up in: 6 months.  You will receive a reminder letter in the mail one-two months in advance.  If you don't receive a letter, please call our office to schedule the follow up appointment   Any Other Special Instructions Will Be Listed Below (If Applicable).  If you need a refill on your cardiac medications before your next appointment, please call your pharmacy.  

## 2019-04-11 ENCOUNTER — Telehealth: Payer: Self-pay | Admitting: *Deleted

## 2019-04-11 NOTE — Telephone Encounter (Signed)
Pt contacted to switch 04/15/19 appt with Dr Diona Browner to telehealth, wife says pt fell and broke his hip and currently in rehab - declined telehealth appt at this time and would contact our office when pt returns home

## 2019-04-15 ENCOUNTER — Ambulatory Visit: Payer: Medicare Other | Admitting: Cardiology

## 2019-07-16 DEATH — deceased
